# Patient Record
Sex: Female | Born: 1970 | Race: White | Hispanic: No | Smoking: Current every day smoker
Health system: Southern US, Community
[De-identification: ages and names within clinical notes are randomized; demographics above are authoritative.]

## PROBLEM LIST (undated history)

## (undated) DIAGNOSIS — A4902 Methicillin resistant Staphylococcus aureus infection, unspecified site: Secondary | ICD-10-CM

## (undated) DIAGNOSIS — F419 Anxiety disorder, unspecified: Secondary | ICD-10-CM

## (undated) DIAGNOSIS — F329 Major depressive disorder, single episode, unspecified: Secondary | ICD-10-CM

## (undated) DIAGNOSIS — G8929 Other chronic pain: Secondary | ICD-10-CM

## (undated) DIAGNOSIS — Z765 Malingerer [conscious simulation]: Secondary | ICD-10-CM

## (undated) DIAGNOSIS — K219 Gastro-esophageal reflux disease without esophagitis: Secondary | ICD-10-CM

## (undated) DIAGNOSIS — S069X9A Unspecified intracranial injury with loss of consciousness of unspecified duration, initial encounter: Secondary | ICD-10-CM

## (undated) DIAGNOSIS — F319 Bipolar disorder, unspecified: Secondary | ICD-10-CM

## (undated) DIAGNOSIS — M329 Systemic lupus erythematosus, unspecified: Secondary | ICD-10-CM

## (undated) DIAGNOSIS — M310 Hypersensitivity angiitis: Secondary | ICD-10-CM

## (undated) DIAGNOSIS — F32A Depression, unspecified: Secondary | ICD-10-CM

## (undated) DIAGNOSIS — R0600 Dyspnea, unspecified: Secondary | ICD-10-CM

## (undated) DIAGNOSIS — M19072 Primary osteoarthritis, left ankle and foot: Secondary | ICD-10-CM

## (undated) DIAGNOSIS — Z720 Tobacco use: Secondary | ICD-10-CM

## (undated) DIAGNOSIS — IMO0002 Reserved for concepts with insufficient information to code with codable children: Secondary | ICD-10-CM

## (undated) DIAGNOSIS — M1990 Unspecified osteoarthritis, unspecified site: Secondary | ICD-10-CM

## (undated) DIAGNOSIS — J449 Chronic obstructive pulmonary disease, unspecified: Secondary | ICD-10-CM

## (undated) HISTORY — PX: ANKLE SURGERY: SHX546

## (undated) HISTORY — DX: Unspecified osteoarthritis, unspecified site: M19.90

## (undated) HISTORY — PX: CHOLECYSTECTOMY: SHX55

## (undated) HISTORY — DX: Major depressive disorder, single episode, unspecified: F32.9

## (undated) HISTORY — DX: Depression, unspecified: F32.A

## (undated) HISTORY — PX: TUBAL LIGATION: SHX77

## (undated) HISTORY — DX: Other chronic pain: G89.29

## (undated) HISTORY — DX: Tobacco use: Z72.0

## (undated) HISTORY — DX: Hypersensitivity angiitis: M31.0

---

## 1998-03-25 ENCOUNTER — Ambulatory Visit (HOSPITAL_COMMUNITY): Admission: RE | Admit: 1998-03-25 | Discharge: 1998-03-26 | Payer: Self-pay | Admitting: Specialist

## 1998-12-10 ENCOUNTER — Encounter: Payer: Self-pay | Admitting: Emergency Medicine

## 1998-12-10 ENCOUNTER — Emergency Department (HOSPITAL_COMMUNITY): Admission: EM | Admit: 1998-12-10 | Discharge: 1998-12-10 | Payer: Self-pay | Admitting: Emergency Medicine

## 2000-01-01 ENCOUNTER — Inpatient Hospital Stay (HOSPITAL_COMMUNITY): Admission: AD | Admit: 2000-01-01 | Discharge: 2000-01-01 | Payer: Self-pay | Admitting: Obstetrics

## 2000-01-02 ENCOUNTER — Ambulatory Visit (HOSPITAL_COMMUNITY): Admission: RE | Admit: 2000-01-02 | Discharge: 2000-01-02 | Payer: Self-pay | Admitting: Obstetrics

## 2000-06-10 ENCOUNTER — Ambulatory Visit (HOSPITAL_COMMUNITY): Admission: RE | Admit: 2000-06-10 | Discharge: 2000-06-10 | Payer: Self-pay | Admitting: *Deleted

## 2000-06-21 ENCOUNTER — Inpatient Hospital Stay (HOSPITAL_COMMUNITY): Admission: AD | Admit: 2000-06-21 | Discharge: 2000-06-22 | Payer: Self-pay | Admitting: *Deleted

## 2000-10-09 ENCOUNTER — Emergency Department (HOSPITAL_COMMUNITY): Admission: EM | Admit: 2000-10-09 | Discharge: 2000-10-09 | Payer: Self-pay | Admitting: Emergency Medicine

## 2000-10-09 ENCOUNTER — Encounter: Payer: Self-pay | Admitting: Emergency Medicine

## 2000-10-12 ENCOUNTER — Emergency Department (HOSPITAL_COMMUNITY): Admission: EM | Admit: 2000-10-12 | Discharge: 2000-10-12 | Payer: Self-pay | Admitting: Emergency Medicine

## 2000-10-26 ENCOUNTER — Emergency Department (HOSPITAL_COMMUNITY): Admission: EM | Admit: 2000-10-26 | Discharge: 2000-10-26 | Payer: Self-pay | Admitting: Internal Medicine

## 2000-10-26 ENCOUNTER — Encounter: Payer: Self-pay | Admitting: Emergency Medicine

## 2000-12-29 ENCOUNTER — Encounter: Payer: Self-pay | Admitting: Emergency Medicine

## 2000-12-29 ENCOUNTER — Emergency Department (HOSPITAL_COMMUNITY): Admission: EM | Admit: 2000-12-29 | Discharge: 2000-12-29 | Payer: Self-pay | Admitting: Emergency Medicine

## 2001-01-02 ENCOUNTER — Encounter: Payer: Self-pay | Admitting: Emergency Medicine

## 2001-01-02 ENCOUNTER — Emergency Department (HOSPITAL_COMMUNITY): Admission: EM | Admit: 2001-01-02 | Discharge: 2001-01-02 | Payer: Self-pay | Admitting: Emergency Medicine

## 2001-01-06 ENCOUNTER — Ambulatory Visit (HOSPITAL_BASED_OUTPATIENT_CLINIC_OR_DEPARTMENT_OTHER): Admission: RE | Admit: 2001-01-06 | Discharge: 2001-01-06 | Payer: Self-pay | Admitting: Specialist

## 2001-01-14 ENCOUNTER — Emergency Department (HOSPITAL_COMMUNITY): Admission: EM | Admit: 2001-01-14 | Discharge: 2001-01-14 | Payer: Self-pay | Admitting: Emergency Medicine

## 2001-01-14 ENCOUNTER — Encounter: Payer: Self-pay | Admitting: Emergency Medicine

## 2001-05-14 ENCOUNTER — Emergency Department (HOSPITAL_COMMUNITY): Admission: EM | Admit: 2001-05-14 | Discharge: 2001-05-14 | Payer: Self-pay | Admitting: *Deleted

## 2001-05-23 ENCOUNTER — Encounter: Payer: Self-pay | Admitting: Emergency Medicine

## 2001-05-23 ENCOUNTER — Emergency Department (HOSPITAL_COMMUNITY): Admission: EM | Admit: 2001-05-23 | Discharge: 2001-05-23 | Payer: Self-pay | Admitting: Emergency Medicine

## 2001-06-09 ENCOUNTER — Emergency Department (HOSPITAL_COMMUNITY): Admission: EM | Admit: 2001-06-09 | Discharge: 2001-06-09 | Payer: Self-pay | Admitting: Emergency Medicine

## 2001-06-20 ENCOUNTER — Emergency Department (HOSPITAL_COMMUNITY): Admission: EM | Admit: 2001-06-20 | Discharge: 2001-06-20 | Payer: Self-pay | Admitting: Emergency Medicine

## 2001-07-06 ENCOUNTER — Encounter: Payer: Self-pay | Admitting: Emergency Medicine

## 2001-07-06 ENCOUNTER — Emergency Department (HOSPITAL_COMMUNITY): Admission: EM | Admit: 2001-07-06 | Discharge: 2001-07-06 | Payer: Self-pay | Admitting: Emergency Medicine

## 2001-07-14 ENCOUNTER — Emergency Department (HOSPITAL_COMMUNITY): Admission: EM | Admit: 2001-07-14 | Discharge: 2001-07-14 | Payer: Self-pay | Admitting: Emergency Medicine

## 2001-07-14 ENCOUNTER — Encounter: Payer: Self-pay | Admitting: Emergency Medicine

## 2001-08-02 ENCOUNTER — Emergency Department (HOSPITAL_COMMUNITY): Admission: EM | Admit: 2001-08-02 | Discharge: 2001-08-02 | Payer: Self-pay

## 2001-08-10 ENCOUNTER — Encounter: Payer: Self-pay | Admitting: Emergency Medicine

## 2001-08-10 ENCOUNTER — Emergency Department (HOSPITAL_COMMUNITY): Admission: EM | Admit: 2001-08-10 | Discharge: 2001-08-10 | Payer: Self-pay | Admitting: Emergency Medicine

## 2001-09-05 ENCOUNTER — Emergency Department (HOSPITAL_COMMUNITY): Admission: EM | Admit: 2001-09-05 | Discharge: 2001-09-05 | Payer: Self-pay | Admitting: Emergency Medicine

## 2001-09-05 ENCOUNTER — Encounter: Payer: Self-pay | Admitting: Emergency Medicine

## 2001-09-16 ENCOUNTER — Emergency Department (HOSPITAL_COMMUNITY): Admission: EM | Admit: 2001-09-16 | Discharge: 2001-09-16 | Payer: Self-pay | Admitting: Emergency Medicine

## 2001-10-31 ENCOUNTER — Inpatient Hospital Stay (HOSPITAL_COMMUNITY): Admission: RE | Admit: 2001-10-31 | Discharge: 2001-11-03 | Payer: Self-pay | Admitting: Specialist

## 2001-10-31 ENCOUNTER — Encounter: Payer: Self-pay | Admitting: Specialist

## 2001-12-22 ENCOUNTER — Encounter: Payer: Self-pay | Admitting: Specialist

## 2001-12-22 ENCOUNTER — Ambulatory Visit (HOSPITAL_COMMUNITY): Admission: RE | Admit: 2001-12-22 | Discharge: 2001-12-22 | Payer: Self-pay | Admitting: Specialist

## 2002-01-09 ENCOUNTER — Ambulatory Visit (HOSPITAL_COMMUNITY): Admission: RE | Admit: 2002-01-09 | Discharge: 2002-01-09 | Payer: Self-pay | Admitting: Specialist

## 2002-01-09 ENCOUNTER — Encounter: Payer: Self-pay | Admitting: Specialist

## 2002-02-01 ENCOUNTER — Emergency Department (HOSPITAL_COMMUNITY): Admission: EM | Admit: 2002-02-01 | Discharge: 2002-02-01 | Payer: Self-pay | Admitting: Emergency Medicine

## 2002-02-07 ENCOUNTER — Emergency Department (HOSPITAL_COMMUNITY): Admission: EM | Admit: 2002-02-07 | Discharge: 2002-02-07 | Payer: Self-pay

## 2002-02-27 ENCOUNTER — Emergency Department (HOSPITAL_COMMUNITY): Admission: EM | Admit: 2002-02-27 | Discharge: 2002-02-27 | Payer: Self-pay | Admitting: Emergency Medicine

## 2002-03-15 ENCOUNTER — Emergency Department (HOSPITAL_COMMUNITY): Admission: EM | Admit: 2002-03-15 | Discharge: 2002-03-16 | Payer: Self-pay | Admitting: Emergency Medicine

## 2002-03-15 ENCOUNTER — Encounter: Payer: Self-pay | Admitting: Emergency Medicine

## 2002-04-06 ENCOUNTER — Emergency Department (HOSPITAL_COMMUNITY): Admission: EM | Admit: 2002-04-06 | Discharge: 2002-04-06 | Payer: Self-pay | Admitting: Emergency Medicine

## 2002-04-06 ENCOUNTER — Encounter: Payer: Self-pay | Admitting: Emergency Medicine

## 2002-05-15 ENCOUNTER — Emergency Department (HOSPITAL_COMMUNITY): Admission: EM | Admit: 2002-05-15 | Discharge: 2002-05-15 | Payer: Self-pay | Admitting: Emergency Medicine

## 2002-06-26 ENCOUNTER — Emergency Department (HOSPITAL_COMMUNITY): Admission: EM | Admit: 2002-06-26 | Discharge: 2002-06-26 | Payer: Self-pay | Admitting: Emergency Medicine

## 2002-07-19 ENCOUNTER — Inpatient Hospital Stay (HOSPITAL_COMMUNITY): Admission: EM | Admit: 2002-07-19 | Discharge: 2002-07-22 | Payer: Self-pay | Admitting: *Deleted

## 2003-06-15 ENCOUNTER — Emergency Department (HOSPITAL_COMMUNITY): Admission: EM | Admit: 2003-06-15 | Discharge: 2003-06-15 | Payer: Self-pay | Admitting: Emergency Medicine

## 2003-06-15 ENCOUNTER — Encounter: Payer: Self-pay | Admitting: Emergency Medicine

## 2005-09-27 ENCOUNTER — Emergency Department (HOSPITAL_COMMUNITY): Admission: EM | Admit: 2005-09-27 | Discharge: 2005-09-28 | Payer: Self-pay | Admitting: Emergency Medicine

## 2006-06-06 ENCOUNTER — Ambulatory Visit (HOSPITAL_COMMUNITY): Admission: EM | Admit: 2006-06-06 | Discharge: 2006-06-07 | Payer: Self-pay | Admitting: Emergency Medicine

## 2006-06-06 ENCOUNTER — Encounter (INDEPENDENT_AMBULATORY_CARE_PROVIDER_SITE_OTHER): Payer: Self-pay | Admitting: Specialist

## 2006-06-06 ENCOUNTER — Encounter: Payer: Self-pay | Admitting: Family Medicine

## 2007-05-29 ENCOUNTER — Emergency Department (HOSPITAL_COMMUNITY): Admission: EM | Admit: 2007-05-29 | Discharge: 2007-05-29 | Payer: Self-pay | Admitting: Emergency Medicine

## 2007-07-28 ENCOUNTER — Emergency Department (HOSPITAL_COMMUNITY): Admission: EM | Admit: 2007-07-28 | Discharge: 2007-07-28 | Payer: Self-pay | Admitting: Emergency Medicine

## 2007-08-01 ENCOUNTER — Observation Stay (HOSPITAL_COMMUNITY): Admission: EM | Admit: 2007-08-01 | Discharge: 2007-08-01 | Payer: Self-pay | Admitting: Emergency Medicine

## 2007-09-15 ENCOUNTER — Emergency Department (HOSPITAL_COMMUNITY): Admission: EM | Admit: 2007-09-15 | Discharge: 2007-09-15 | Payer: Self-pay | Admitting: Emergency Medicine

## 2007-10-25 ENCOUNTER — Emergency Department (HOSPITAL_COMMUNITY): Admission: EM | Admit: 2007-10-25 | Discharge: 2007-10-26 | Payer: Self-pay | Admitting: Emergency Medicine

## 2007-11-26 ENCOUNTER — Emergency Department (HOSPITAL_COMMUNITY): Admission: EM | Admit: 2007-11-26 | Discharge: 2007-11-26 | Payer: Self-pay | Admitting: Emergency Medicine

## 2009-06-06 ENCOUNTER — Inpatient Hospital Stay (HOSPITAL_COMMUNITY): Admission: EM | Admit: 2009-06-06 | Discharge: 2009-06-09 | Payer: Self-pay | Admitting: Emergency Medicine

## 2009-06-07 ENCOUNTER — Encounter (INDEPENDENT_AMBULATORY_CARE_PROVIDER_SITE_OTHER): Payer: Self-pay | Admitting: Dermatology

## 2010-11-27 ENCOUNTER — Inpatient Hospital Stay (HOSPITAL_COMMUNITY)
Admission: EM | Admit: 2010-11-27 | Discharge: 2010-12-01 | Payer: Self-pay | Source: Home / Self Care | Attending: Internal Medicine | Admitting: Internal Medicine

## 2010-12-04 LAB — RPR: RPR Ser Ql: NONREACTIVE

## 2010-12-04 LAB — RAPID URINE DRUG SCREEN, HOSP PERFORMED
Amphetamines: NOT DETECTED
Barbiturates: NOT DETECTED
Benzodiazepines: POSITIVE — AB
Cocaine: POSITIVE — AB
Opiates: POSITIVE — AB
Tetrahydrocannabinol: POSITIVE — AB

## 2010-12-04 LAB — HEPATIC FUNCTION PANEL
ALT: 12 U/L (ref 0–35)
AST: 17 U/L (ref 0–37)
Albumin: 2.7 g/dL — ABNORMAL LOW (ref 3.5–5.2)
Alkaline Phosphatase: 63 U/L (ref 39–117)
Bilirubin, Direct: <0.1 mg/dL (ref 0.0–0.3)
Total Bilirubin: 0.2 mg/dL — ABNORMAL LOW (ref 0.3–1.2)
Total Protein: 6.3 g/dL (ref 6.0–8.3)

## 2010-12-04 LAB — BASIC METABOLIC PANEL
BUN: 5 mg/dL — ABNORMAL LOW (ref 6–23)
CO2: 26 mEq/L (ref 19–32)
Calcium: 8.4 mg/dL (ref 8.4–10.5)
Chloride: 107 mEq/L (ref 96–112)
Creatinine, Ser: 0.76 mg/dL (ref 0.4–1.2)
GFR calc Af Amer: >60 mL/min (ref >60)
GFR calc non Af Amer: >60 mL/min (ref >60)
Glucose, Bld: 176 mg/dL — ABNORMAL HIGH (ref 70–99)
Potassium: 4.3 mEq/L (ref 3.5–5.1)
Sodium: 139 mEq/L (ref 135–145)

## 2010-12-04 LAB — URINALYSIS, ROUTINE W REFLEX MICROSCOPIC
Bilirubin Urine: NEGATIVE
Hgb urine dipstick: NEGATIVE
Ketones, ur: NEGATIVE mg/dL
Nitrite: NEGATIVE
Protein, ur: NEGATIVE mg/dL
Specific Gravity, Urine: 1.009 (ref 1.005–1.030)
Urine Glucose, Fasting: NEGATIVE mg/dL
Urobilinogen, UA: 1 mg/dL (ref 0.0–1.0)
pH: 7.5 (ref 5.0–8.0)

## 2010-12-04 LAB — POCT I-STAT, CHEM 8
BUN: 4 mg/dL — ABNORMAL LOW (ref 6–23)
Calcium, Ion: 1.06 mmol/L — ABNORMAL LOW (ref 1.12–1.32)
Chloride: 109 mEq/L (ref 96–112)
Creatinine, Ser: 0.8 mg/dL (ref 0.4–1.2)
Glucose, Bld: 105 mg/dL — ABNORMAL HIGH (ref 70–99)
HCT: 34 % — ABNORMAL LOW (ref 36.0–46.0)
Hemoglobin: 11.6 g/dL — ABNORMAL LOW (ref 12.0–15.0)
Potassium: 3.6 mEq/L (ref 3.5–5.1)
Sodium: 142 mEq/L (ref 135–145)
TCO2: 25 mmol/L (ref 0–100)

## 2010-12-04 LAB — LIPID PANEL
Cholesterol: 137 mg/dL (ref 0–200)
HDL: 44 mg/dL (ref >39)
LDL Cholesterol: 78 mg/dL (ref 0–99)
Total CHOL/HDL Ratio: 3.1 RATIO
Triglycerides: 73 mg/dL (ref <150)
VLDL: 15 mg/dL (ref 0–40)

## 2010-12-04 LAB — CBC
HCT: 35.2 % — ABNORMAL LOW (ref 36.0–46.0)
HCT: 37.4 % (ref 36.0–46.0)
Hemoglobin: 11.5 g/dL — ABNORMAL LOW (ref 12.0–15.0)
Hemoglobin: 12.4 g/dL (ref 12.0–15.0)
MCH: 28.3 pg (ref 26.0–34.0)
MCHC: 32.7 g/dL (ref 30.0–36.0)
MCHC: 33.2 g/dL (ref 30.0–36.0)
MCV: 86.5 fL (ref 78.0–100.0)
Platelets: 199 10*3/uL (ref 150–400)
Platelets: 203 10*3/uL (ref 150–400)
RBC: 4.07 MIL/uL (ref 3.87–5.11)
RBC: 4.3 MIL/uL (ref 3.87–5.11)
RDW: 12.8 % (ref 11.5–15.5)
RDW: 12.9 % (ref 11.5–15.5)
WBC: 4 10*3/uL (ref 4.0–10.5)
WBC: 4.3 10*3/uL (ref 4.0–10.5)

## 2010-12-04 LAB — HEPATITIS PANEL, ACUTE
HCV Ab: NEGATIVE
Hep A IgM: NEGATIVE
Hep B C IgM: NEGATIVE
Hepatitis B Surface Ag: NEGATIVE

## 2010-12-04 LAB — C3 COMPLEMENT: C3 Complement: 77 mg/dL — ABNORMAL LOW (ref 88–201)

## 2010-12-04 LAB — DIFFERENTIAL
Basophils Absolute: 0 10*3/uL (ref 0.0–0.1)
Basophils Relative: 1 % (ref 0–1)
Eosinophils Absolute: 0.1 10*3/uL (ref 0.0–0.7)
Eosinophils Relative: 3 % (ref 0–5)
Lymphocytes Relative: 40 % (ref 12–46)
Lymphs Abs: 1.6 10*3/uL (ref 0.7–4.0)
Monocytes Absolute: 0.3 10*3/uL (ref 0.1–1.0)
Monocytes Relative: 7 % (ref 3–12)
Neutro Abs: 2 10*3/uL (ref 1.7–7.7)
Neutrophils Relative %: 50 % (ref 43–77)

## 2010-12-04 LAB — SEDIMENTATION RATE: Sed Rate: 32 mm/hr — ABNORMAL HIGH (ref 0–22)

## 2010-12-04 LAB — PROTIME-INR
INR: 1.01 (ref 0.00–1.49)
Prothrombin Time: 13.5 seconds (ref 11.6–15.2)

## 2010-12-04 LAB — MRSA PCR SCREENING: MRSA by PCR: POSITIVE — AB

## 2010-12-04 LAB — HIV ANTIBODY (ROUTINE TESTING W REFLEX): HIV: NONREACTIVE

## 2010-12-04 LAB — C4 COMPLEMENT: Complement C4, Body Fluid: 10 mg/dL — ABNORMAL LOW (ref 16–47)

## 2011-01-07 ENCOUNTER — Inpatient Hospital Stay (HOSPITAL_COMMUNITY)
Admission: EM | Admit: 2011-01-07 | Discharge: 2011-01-11 | DRG: 546 | Disposition: A | Discharge status: Home / Self Care | Payer: Self-pay | Attending: Internal Medicine | Admitting: Internal Medicine

## 2011-01-07 DIAGNOSIS — F3289 Other specified depressive episodes: Secondary | ICD-10-CM | POA: Diagnosis present

## 2011-01-07 DIAGNOSIS — F17200 Nicotine dependence, unspecified, uncomplicated: Secondary | ICD-10-CM | POA: Diagnosis present

## 2011-01-07 DIAGNOSIS — IMO0002 Reserved for concepts with insufficient information to code with codable children: Secondary | ICD-10-CM

## 2011-01-07 DIAGNOSIS — L02818 Cutaneous abscess of other sites: Secondary | ICD-10-CM | POA: Diagnosis present

## 2011-01-07 DIAGNOSIS — M310 Hypersensitivity angiitis: Principal | ICD-10-CM | POA: Diagnosis present

## 2011-01-07 DIAGNOSIS — F1910 Other psychoactive substance abuse, uncomplicated: Secondary | ICD-10-CM | POA: Diagnosis present

## 2011-01-07 DIAGNOSIS — Z5900 Homelessness unspecified: Secondary | ICD-10-CM

## 2011-01-07 DIAGNOSIS — N390 Urinary tract infection, site not specified: Secondary | ICD-10-CM | POA: Diagnosis present

## 2011-01-07 DIAGNOSIS — F329 Major depressive disorder, single episode, unspecified: Secondary | ICD-10-CM | POA: Diagnosis present

## 2011-01-07 DIAGNOSIS — E876 Hypokalemia: Secondary | ICD-10-CM | POA: Diagnosis present

## 2011-01-07 DIAGNOSIS — F1410 Cocaine abuse, uncomplicated: Secondary | ICD-10-CM | POA: Diagnosis present

## 2011-01-07 DIAGNOSIS — F411 Generalized anxiety disorder: Secondary | ICD-10-CM | POA: Diagnosis present

## 2011-01-07 LAB — DIFFERENTIAL
Basophils Absolute: 0 10*3/uL (ref 0.0–0.1)
Basophils Relative: 0 % (ref 0–1)
Eosinophils Absolute: 0 10*3/uL (ref 0.0–0.7)
Monocytes Relative: 3 % (ref 3–12)
Neutro Abs: 4.7 10*3/uL (ref 1.7–7.7)
Neutrophils Relative %: 81 % — ABNORMAL HIGH (ref 43–77)

## 2011-01-07 LAB — URINALYSIS, ROUTINE W REFLEX MICROSCOPIC
Hgb urine dipstick: NEGATIVE
Ketones, ur: 40 mg/dL — AB
Specific Gravity, Urine: 1.04 — ABNORMAL HIGH (ref 1.005–1.030)
pH: 6 (ref 5.0–8.0)

## 2011-01-07 LAB — RAPID URINE DRUG SCREEN, HOSP PERFORMED: Benzodiazepines: NOT DETECTED

## 2011-01-07 LAB — CBC
MCH: 28.2 pg (ref 26.0–34.0)
MCHC: 35.5 g/dL (ref 30.0–36.0)
Platelets: 243 10*3/uL (ref 150–400)
RBC: 5.22 MIL/uL — ABNORMAL HIGH (ref 3.87–5.11)

## 2011-01-07 LAB — URINE MICROSCOPIC-ADD ON

## 2011-01-07 LAB — POCT I-STAT, CHEM 8
Calcium, Ion: 1.06 mmol/L — ABNORMAL LOW (ref 1.12–1.32)
HCT: 44 % (ref 36.0–46.0)
Hemoglobin: 15 g/dL (ref 12.0–15.0)
TCO2: 20 mmol/L (ref 0–100)

## 2011-01-07 LAB — POCT CARDIAC MARKERS
Myoglobin, poc: 122 ng/mL (ref 12–200)
Troponin i, poc: <0.05 ng/mL (ref 0.00–0.09)

## 2011-01-07 LAB — ETHANOL: Alcohol, Ethyl (B): <5 mg/dL (ref 0–10)

## 2011-01-08 LAB — CBC
HCT: 37.9 % (ref 36.0–46.0)
Hemoglobin: 13 g/dL (ref 12.0–15.0)
MCV: 80.6 fL (ref 78.0–100.0)
Platelets: 235 10*3/uL (ref 150–400)
RBC: 4.7 MIL/uL (ref 3.87–5.11)
WBC: 3.1 10*3/uL — ABNORMAL LOW (ref 4.0–10.5)

## 2011-01-08 LAB — MRSA PCR SCREENING: MRSA by PCR: POSITIVE — AB

## 2011-01-08 LAB — BASIC METABOLIC PANEL
BUN: 5 mg/dL — ABNORMAL LOW (ref 6–23)
CO2: 24 mEq/L (ref 19–32)
Chloride: 106 mEq/L (ref 96–112)
Potassium: 3.5 mEq/L (ref 3.5–5.1)

## 2011-01-09 LAB — BASIC METABOLIC PANEL
BUN: 9 mg/dL (ref 6–23)
Calcium: 8.8 mg/dL (ref 8.4–10.5)
GFR calc non Af Amer: >60 mL/min (ref >60)
Glucose, Bld: 150 mg/dL — ABNORMAL HIGH (ref 70–99)
Sodium: 138 mEq/L (ref 135–145)

## 2011-01-09 LAB — CBC
HCT: 36.8 % (ref 36.0–46.0)
MCHC: 33.7 g/dL (ref 30.0–36.0)
MCV: 84.4 fL (ref 78.0–100.0)
RDW: 14.1 % (ref 11.5–15.5)

## 2011-01-11 LAB — WOUND CULTURE

## 2011-01-14 LAB — CULTURE, BLOOD (ROUTINE X 2)
Culture  Setup Time: 201202200857
Culture: NO GROWTH

## 2011-01-25 NOTE — H&P (Signed)
[name], [name]                  ACCOUNT NO.:  [id number]  MEDICAL RECORD NO.:  [id number]           PATIENT TYPE:  E  LOCATION:  [facility]                         FACILITY:  [facility]  PHYSICIAN:  Lonia Blood, M.D.      DATE OF BIRTH:  [birth year]  DATE OF ADMISSION:  01/07/2011 DATE OF DISCHARGE:                             HISTORY & PHYSICAL   PRIMARY CARE PHYSICIAN:  She is unassigned to [place].  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  She is being followed by Dr. Kellie Simmering, rheumatologist.  She also mentioned that she has had history of lupus in the past.  The patient abuses cocaine and she was seen here back on November 27, 2010, through December 01, 2010.  At that time, she was treated with steroids for a flare of her leukocytoclastic vasculitis. She was asked to follow up with [facility], but she never did.  She is back today with the same lesions.  She claimed that she has been on 40 mg of prednisone.  However, based on the prescription, she should have been off it by now through the tapering system, which means she has not been using it.  She is also having pain in her buttock areas with some obvious lesions.  The patient is also complaining of some mild pain all over.  She has had all the necessary workup recently.  Please refer to admission just a month ago.  PAST MEDICAL HISTORY: 1. Leukocytoclastic vasculitis. 2. Depression. 3. Anxiety. 4. Cocaine abuse. 5. Polysubstance abuse.  She is status post ankle surgery from crush injury, status post cholecystectomy.  ALLERGIES:  She has no known drug allergies.  CURRENT MEDICATIONS:  Prednisone only, and I doubt if she is taking it.  SOCIAL HISTORY:  She is 40.  She still smokes tobacco and uses cocaine. The patient has 1 daughter who  is 60 years' old.  She also has 1 sister. She is currently homeless and disabled.  She still admits to using crack cocaine, but wants to quit.  FAMILY HISTORY:  Noncontributory, with no known history of autoimmune diseases.  REVIEW OF SYSTEMS:  Generalized weakness and tiredness.  Otherwise, all systems reviewed are currently negative except per HPI.  VITAL SIGNS:  On exam, temperature is 97.6, blood pressure 127/94, pulse of 100, respiratory rate 18, sats 100% on room air. GENERAL:  The patient is acutely ill looking, worried, but in no acute distress. HEENT:  PERRL.  EOMI.  No pallor, no jaundice.  No rhinorrhea.  She has poor dentition. NECK:  Supple.  No JVD, no lymphadenopathy. RESPIRATORY:  She has good air entry bilaterally.  No wheeze or rales. CARDIOVASCULAR:  She is mildly tachycardic. ABDOMEN:  Soft, full, nontender, with positive bowel sounds. EXTREMITIES:  Show no edema, cyanosis, clubbing. SKIN:  Exam shows multiple vasculitic changes on the trunks.  The patient has also open sores on her buttocks.  Exam looks entirely like  previous presentation about a month ago.  LABORATORY DATA:  Her urine drug screen is positive for cocaine mainly. Alcohol level is less than 5.  Her sodium is 137, potassium is 3.0, chloride is 105, glucose 121, BUN 7, creatinine 0.6.  Her urinalysis is positive for bilirubin, ketones, proteins, also cloudy with some leukocyte esterase.  Urine microscopy showed calcium oxalate crystals, wbc's 3 to 6, and many bacteria.  ASSESSMENT:  This is a 40 year old female with known history of leukocytoclastic vasculitis, presenting with another flare, more than likely secondary to medication noncompliance.  There is also evidence of some urinary tract infection, hypokalemia, and some carbuncles and furuncles.  The patient still uses cocaine, which may be the trigger, especially the levamisole in the cocaine that is known to cause some vasculitis.  She has  continued to use it despite counseling.  PLAN: 1. Leukocytoclastic vasculitis.  Mainly, treatment will be supportive     in nature.  I will use some IV steroids in the beginning and     transition her to oral steroids as necessary.  In the meantime,     however, the patient has been counseled that the only solution is     for her to quit taking cocaine and also to follow up as an     outpatient for close monitoring. 2. Urinary tract infection.  I will start her on empiric antibiotics     and get some urine culture. 3. Cocaine abuse.  Again, she has been counseled and we will do     substance abuse counseling once again.  She may need to go through     some halfway house or some inpatient rehab somewhere, but it is     difficult without an insurance. 4. Hypokalemia.  We will replete her potassium for now.     Lonia Blood, M.D.     [name]  D:  01/07/2011  T:  01/08/2011  Job:  [id number]  Electronically Signed by Lonia Blood M.D. on 01/25/2011 06:30:00 AM

## 2011-02-07 NOTE — Discharge Summary (Signed)
[name], [name]                  ACCOUNT NO.:  [id number]  MEDICAL RECORD NO.:  [id number]           PATIENT TYPE:  I  LOCATION:  5025                         FACILITY:  [facility]  PHYSICIAN:  Clydia Llano, MD       DATE OF BIRTH:  September 13, 1971  DATE OF ADMISSION:  01/07/2011 DATE OF DISCHARGE:  01/11/2011                              DISCHARGE SUMMARY   PRIMARY CARE PHYSICIAN:  [facility].  REASON FOR ADMISSION:  Skin rash.  DISCHARGE DIAGNOSES: 1. Cellulitis. 2. Leukocytoclastic vasculitis flare. 3. Depression. 4. Polysubstance abuse.  DISCHARGE MEDICATIONS: 1. Doxycycline 100 mg b.i.d. for 10 more days. 2. Prednisone 10 mg, take 4 tablets for 2 days, then 3 tablets for 2     days, then 2 tablets for 2 days, then 1 tablet for 2 days, then     stop. 3. Silver sulfadiazine 1% cream, apply topically as needed. 4. Tramadol 50 mg half to one tablet p.o. q.8 h. as needed for pain. 5. Tylenol 325 two tablets every 4 hours as needed for pain.  BRIEF HISTORY AND EXAMINATION:  [name] is a 40 year old female with known history of leukocytoclastic vasculitis from previous biopsy that was done in July 2010.  The patient was being followed by Dr. Kellie Simmering from Rheumatology and Dr. Mathews Robinsons from Dermatology.  The patient came into the hospital because of rash and lesions in her buttocks as well as extensor lesions of the upper and lower extremities, mainly around the elbow joint and the knee joints.  Some of the rash is open and has clear to serosanguineous discharge.  The patient was in the hospital from January 9th to 13th for the same problem, and patient was discharged on steroids for the flare-up and asked to follow up with [facility], which she apparently never did.  BRIEF HOSPITAL COURSE: 1. Leukocytoclastic vasculitis with a flare-up.  Upon admission,     patient was started on IV steroids.  The patient was mentioning     pain allover the skin lesions.  Mainly, the  treatment for the     leukocytoclastic vasculitis is supportive with steroids and pain     medication as well as skin lubricants as well as skin hydrants.     The patient was started on all of these.  There was question about     superimposed infection.  The patient was on vancomycin during the     hospital stay, she had that for 3 days.  She will be discharged, to     take doxycycline for 10 more days. 2. Polysubstance abuse.  The patient has urine toxicology positive for     cocaine.  Although patient is denying frequent use, but whenever     she came to the hospital, apparently it was positive.  The patient     was counseled about cocaine use and about the relationship between     the leukocytoclastic vasculitis and cocaine.  Especially, the     filling materials in cocaine, namely levamisole which can flare up     the vasculitis.  The patient voiced understanding.  DISCHARGE INSTRUCTIONS: 1. Activity as tolerated. 2. Diet:  Regular.  Disposition:  Home. 3. Followup.  Follow up with [facility]. 4. Special instructions.  Wound care, apply Silvadene topically as     needed.  The patient, also if needed, was seen before by Dr. Mathews Robinsons from Dermatology, if she needs followup.     Clydia Llano, MD     ME/MEDQ  D:  01/11/2011  T:  01/12/2011  Job:  [id number]  Electronically Signed by Clydia Llano  on 02/07/2011 07:47:28 PM

## 2011-02-25 LAB — T-HELPER CELLS (CD4) COUNT (NOT AT ARMC)
CD4 % Helper T Cell: 53 % (ref 33–55)
CD4 T Cell Abs: 730 uL (ref 400–2700)

## 2011-02-25 LAB — CBC
HCT: 35.3 % — ABNORMAL LOW (ref 36.0–46.0)
Hemoglobin: 12.1 g/dL (ref 12.0–15.0)
MCHC: 34.4 g/dL (ref 30.0–36.0)
MCHC: 34.7 g/dL (ref 30.0–36.0)
Platelets: 271 10*3/uL (ref 150–400)
RDW: 13.5 % (ref 11.5–15.5)
RDW: 14.1 % (ref 11.5–15.5)

## 2011-02-25 LAB — LYSOZYME, SERUM: Lysozyme: 11 ug/mL (ref 9–17)

## 2011-02-25 LAB — DIFFERENTIAL
Basophils Absolute: 0 10*3/uL (ref 0.0–0.1)
Lymphocytes Relative: 24 % (ref 12–46)
Lymphs Abs: 0.9 10*3/uL (ref 0.7–4.0)
Monocytes Absolute: 0.1 10*3/uL (ref 0.1–1.0)
Neutro Abs: 2.6 10*3/uL (ref 1.7–7.7)

## 2011-02-25 LAB — CULTURE, BLOOD (ROUTINE X 2): Culture: NO GROWTH

## 2011-02-25 LAB — COMPREHENSIVE METABOLIC PANEL
ALT: 35 U/L (ref 0–35)
AST: 30 U/L (ref 0–37)
Calcium: 8.5 mg/dL (ref 8.4–10.5)
GFR calc Af Amer: >60 mL/min (ref >60)
Sodium: 128 mEq/L — ABNORMAL LOW (ref 135–145)
Total Protein: 6.9 g/dL (ref 6.0–8.3)

## 2011-02-25 LAB — RAPID URINE DRUG SCREEN, HOSP PERFORMED
Barbiturates: NOT DETECTED
Benzodiazepines: POSITIVE — AB
Opiates: POSITIVE — AB

## 2011-02-25 LAB — BASIC METABOLIC PANEL
BUN: 8 mg/dL (ref 6–23)
CO2: 22 mEq/L (ref 19–32)
Glucose, Bld: 107 mg/dL — ABNORMAL HIGH (ref 70–99)
Potassium: 4.6 mEq/L (ref 3.5–5.1)
Sodium: 134 mEq/L — ABNORMAL LOW (ref 135–145)

## 2011-02-25 LAB — CHOLESTEROL, TOTAL: Cholesterol: 133 mg/dL (ref 0–200)

## 2011-02-25 LAB — ANA: Anti Nuclear Antibody(ANA): POSITIVE — AB

## 2011-02-25 LAB — ANTI-NUCLEAR AB-TITER (ANA TITER): ANA Titer 1: NEGATIVE

## 2011-02-25 LAB — GLUCOSE, CAPILLARY
Glucose-Capillary: 106 mg/dL — ABNORMAL HIGH (ref 70–99)
Glucose-Capillary: 83 mg/dL (ref 70–99)

## 2011-02-25 LAB — TSH: TSH: 4.71 u[IU]/mL — ABNORMAL HIGH (ref 0.350–4.500)

## 2011-02-25 LAB — HEMOGLOBIN A1C: Hgb A1c MFr Bld: 4.2 % — ABNORMAL LOW (ref 4.6–6.1)

## 2011-02-25 LAB — C3 COMPLEMENT: C3 Complement: 105 mg/dL (ref 88–201)

## 2011-03-10 ENCOUNTER — Emergency Department (HOSPITAL_COMMUNITY)
Admission: EM | Admit: 2011-03-10 | Discharge: 2011-03-10 | Disposition: A | Discharge status: Home / Self Care | Payer: Self-pay | Attending: Emergency Medicine | Admitting: Emergency Medicine

## 2011-03-10 DIAGNOSIS — R259 Unspecified abnormal involuntary movements: Secondary | ICD-10-CM | POA: Insufficient documentation

## 2011-03-10 DIAGNOSIS — R21 Rash and other nonspecific skin eruption: Secondary | ICD-10-CM | POA: Insufficient documentation

## 2011-03-10 DIAGNOSIS — R52 Pain, unspecified: Secondary | ICD-10-CM | POA: Insufficient documentation

## 2011-03-10 DIAGNOSIS — F1410 Cocaine abuse, uncomplicated: Secondary | ICD-10-CM | POA: Insufficient documentation

## 2011-03-10 LAB — BASIC METABOLIC PANEL
BUN: 10 mg/dL (ref 6–23)
Chloride: 104 mEq/L (ref 96–112)
Glucose, Bld: 133 mg/dL — ABNORMAL HIGH (ref 70–99)
Potassium: 3.4 mEq/L — ABNORMAL LOW (ref 3.5–5.1)

## 2011-03-10 LAB — DIFFERENTIAL
Eosinophils Relative: 1 % (ref 0–5)
Lymphocytes Relative: 18 % (ref 12–46)
Lymphs Abs: 1.2 10*3/uL (ref 0.7–4.0)

## 2011-03-10 LAB — CBC
HCT: 39.4 % (ref 36.0–46.0)
MCV: 81.7 fL (ref 78.0–100.0)
RBC: 4.82 MIL/uL (ref 3.87–5.11)
RDW: 12.9 % (ref 11.5–15.5)
WBC: 6.8 10*3/uL (ref 4.0–10.5)

## 2011-03-12 ENCOUNTER — Emergency Department (HOSPITAL_COMMUNITY)
Admission: EM | Admit: 2011-03-12 | Discharge: 2011-03-12 | Disposition: A | Discharge status: Home / Self Care | Payer: Self-pay | Attending: Emergency Medicine | Admitting: Emergency Medicine

## 2011-03-12 ENCOUNTER — Inpatient Hospital Stay (HOSPITAL_COMMUNITY)
Admission: EM | Admit: 2011-03-12 | Discharge: 2011-03-16 | DRG: 917 | Disposition: A | Discharge status: Hospice | Payer: Self-pay | Attending: Internal Medicine | Admitting: Internal Medicine

## 2011-03-12 ENCOUNTER — Emergency Department (HOSPITAL_COMMUNITY): Payer: Self-pay

## 2011-03-12 DIAGNOSIS — G9340 Encephalopathy, unspecified: Secondary | ICD-10-CM | POA: Diagnosis present

## 2011-03-12 DIAGNOSIS — Z5900 Homelessness unspecified: Secondary | ICD-10-CM

## 2011-03-12 DIAGNOSIS — M310 Hypersensitivity angiitis: Secondary | ICD-10-CM | POA: Insufficient documentation

## 2011-03-12 DIAGNOSIS — F1410 Cocaine abuse, uncomplicated: Secondary | ICD-10-CM | POA: Diagnosis present

## 2011-03-12 DIAGNOSIS — M329 Systemic lupus erythematosus, unspecified: Secondary | ICD-10-CM | POA: Insufficient documentation

## 2011-03-12 DIAGNOSIS — G8929 Other chronic pain: Secondary | ICD-10-CM | POA: Diagnosis present

## 2011-03-12 DIAGNOSIS — R21 Rash and other nonspecific skin eruption: Secondary | ICD-10-CM | POA: Diagnosis present

## 2011-03-12 DIAGNOSIS — T400X1A Poisoning by opium, accidental (unintentional), initial encounter: Secondary | ICD-10-CM | POA: Diagnosis present

## 2011-03-12 DIAGNOSIS — F341 Dysthymic disorder: Secondary | ICD-10-CM | POA: Diagnosis present

## 2011-03-12 DIAGNOSIS — E876 Hypokalemia: Secondary | ICD-10-CM | POA: Diagnosis present

## 2011-03-12 DIAGNOSIS — I498 Other specified cardiac arrhythmias: Secondary | ICD-10-CM | POA: Diagnosis present

## 2011-03-12 DIAGNOSIS — T40601A Poisoning by unspecified narcotics, accidental (unintentional), initial encounter: Principal | ICD-10-CM | POA: Diagnosis present

## 2011-03-12 DIAGNOSIS — F17200 Nicotine dependence, unspecified, uncomplicated: Secondary | ICD-10-CM | POA: Diagnosis present

## 2011-03-12 LAB — POCT I-STAT 3, ART BLOOD GAS (G3+)
Patient temperature: 95.9
pCO2 arterial: 42.9 mmHg (ref 35.0–45.0)
pH, Arterial: 7.371 (ref 7.350–7.400)

## 2011-03-12 LAB — URINALYSIS, ROUTINE W REFLEX MICROSCOPIC
Bilirubin Urine: NEGATIVE
Glucose, UA: NEGATIVE mg/dL
Protein, ur: NEGATIVE mg/dL

## 2011-03-12 LAB — CBC
HCT: 37.6 % (ref 36.0–46.0)
MCHC: 32.7 g/dL (ref 30.0–36.0)
MCV: 83.6 fL (ref 78.0–100.0)
RDW: 13.1 % (ref 11.5–15.5)

## 2011-03-12 LAB — COMPREHENSIVE METABOLIC PANEL
ALT: 12 U/L (ref 0–35)
AST: 13 U/L (ref 0–37)
Alkaline Phosphatase: 67 U/L (ref 39–117)
CO2: 21 mEq/L (ref 19–32)
GFR calc non Af Amer: >60 mL/min (ref >60)
Glucose, Bld: 120 mg/dL — ABNORMAL HIGH (ref 70–99)
Potassium: 3.8 mEq/L (ref 3.5–5.1)
Sodium: 138 mEq/L (ref 135–145)
Total Protein: 7.6 g/dL (ref 6.0–8.3)

## 2011-03-12 LAB — DIFFERENTIAL
Eosinophils Relative: 0 % (ref 0–5)
Lymphocytes Relative: 15 % (ref 12–46)
Lymphs Abs: 1 10*3/uL (ref 0.7–4.0)
Monocytes Absolute: 0.2 10*3/uL (ref 0.1–1.0)
Monocytes Relative: 3 % (ref 3–12)

## 2011-03-12 LAB — GLUCOSE, CAPILLARY

## 2011-03-12 LAB — POCT I-STAT, CHEM 8
BUN: 9 mg/dL (ref 6–23)
Calcium, Ion: 1.05 mmol/L — ABNORMAL LOW (ref 1.12–1.32)
Chloride: 104 mEq/L (ref 96–112)
HCT: 41 % (ref 36.0–46.0)
Potassium: 3.9 mEq/L (ref 3.5–5.1)
Sodium: 134 mEq/L — ABNORMAL LOW (ref 135–145)

## 2011-03-12 LAB — ACETAMINOPHEN LEVEL: Acetaminophen (Tylenol), Serum: <10 ug/mL — ABNORMAL LOW (ref 10–30)

## 2011-03-12 LAB — POCT PREGNANCY, URINE: Preg Test, Ur: NEGATIVE

## 2011-03-12 LAB — ETHANOL: Alcohol, Ethyl (B): <5 mg/dL (ref 0–10)

## 2011-03-12 LAB — SALICYLATE LEVEL: Salicylate Lvl: <4 mg/dL (ref 2.8–20.0)

## 2011-03-13 DIAGNOSIS — F4323 Adjustment disorder with mixed anxiety and depressed mood: Secondary | ICD-10-CM

## 2011-03-13 LAB — COMPREHENSIVE METABOLIC PANEL
ALT: 35 U/L (ref 0–35)
AST: 46 U/L — ABNORMAL HIGH (ref 0–37)
Albumin: 3.3 g/dL — ABNORMAL LOW (ref 3.5–5.2)
Alkaline Phosphatase: 78 U/L (ref 39–117)
CO2: 26 mEq/L (ref 19–32)
Chloride: 104 mEq/L (ref 96–112)
GFR calc Af Amer: >60 mL/min (ref >60)
Potassium: 3.1 mEq/L — ABNORMAL LOW (ref 3.5–5.1)
Sodium: 137 mEq/L (ref 135–145)
Total Bilirubin: 0.3 mg/dL (ref 0.3–1.2)

## 2011-03-13 LAB — RAPID URINE DRUG SCREEN, HOSP PERFORMED
Amphetamines: NOT DETECTED
Benzodiazepines: POSITIVE — AB

## 2011-03-13 LAB — CBC
Hemoglobin: 12.5 g/dL (ref 12.0–15.0)
Platelets: 279 10*3/uL (ref 150–400)
RBC: 4.48 MIL/uL (ref 3.87–5.11)
WBC: 7.1 10*3/uL (ref 4.0–10.5)

## 2011-03-14 NOTE — Consult Note (Signed)
[name], [name]                  ACCOUNT NO.:  [id number]  MEDICAL RECORD NO.:  [id number]           PATIENT TYPE:  I  LOCATION:  2024                         FACILITY:  [facility]  PHYSICIAN:  Eulogio Ditch, MD DATE OF BIRTH:  [birth year]  DATE OF CONSULTATION:  03/13/2011 DATE OF DISCHARGE:                                CONSULTATION   REASON FOR CONSULTATION:  Depression.  HISTORY OF PRESENT ILLNESS:  A 40 year old white female with history of cocaine abuse who was admitted as she was found with altered mental status and unresponsive at home.  The patient reported that she was given a fentanyl patch by a friend, but she did not try to overdose and kill herself.  The patient was crying during the interview, reported depressed mood, sleep poor, but appetite okay.  Reported hopeless, helpless because of the drug abuse and homelessness.  The patient is logical and goal directed during interview.  Denies any suicidal or homicidal ideation and denies hearing voices, is not delusional.  The patient was admitted in Psychiatry at [facility] in 2003 for substance abuse and was prescribed Lexapro and Seroquel.  Currently, she is not on any psych medication.  The patient denies using any other drugs like heroin or marijuana.  She also denies abusing alcohol.  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  PAST MEDICAL HISTORY:  History of lupus.  ALLERGIES:  No known drug allergies.  PHYSICAL EXAMINATION:  HEART:  Regular rate, rhythm, and volume.  S1 and S2 normal.  No murmur, gallop, or rub. LUNGS:  Clear bilaterally. ABDOMEN:  Soft, nontender, nondistended. NEUROLOGICAL:  Nonfocal.  Cranial nerves intact.  LABORATORY DATA:  Urine pregnancy test negative.  BUN 9, creatinine 0.6. Alcohol level less than 5, AST 13, ALT 12.  MENTAL STATUS EXAM:  The patient is calm, cooperative during the interview.  Fair eye contact.  No abnormal movements noticed.   No psychomotor agitation or retardation noted during the interview.  Mood depressed.  Affect mood congruent.  Speech is normal in rate, rhythm, and volume.  Thought process is logical and goal directed.  Thought content not suicidal or homicidal, not delusional.  Thought perception, no audiovisual hallucination reported, not internally preoccupied. Cognition alert, awake, oriented x3.  Memory immediate, recent remote fair.  Attention and concentration fair.  Abstraction ability fair. Insight and judgment fair.  DIAGNOSES:  AXIS I:  Chronic cocaine dependence, depressive disorder not otherwise specified. AXIS II:  Deferred. AXIS III:  See medical notes. AXIS IV:  Homeless, chronic substance abuse. AXIS V:  39.  RECOMMENDATIONS: 1. The patient will be started Celexa 20 mg p.o. daily to help for     depressive symptoms. 2. I told the case manager to look for rehab place for a patient.  The     patient is willing to go to a rehab.  Thanks for involving me in taking care of this patient.     Eulogio Ditch, MD     SA/MEDQ  D:  03/13/2011  T:  03/13/2011  Job:  [id number]  Electronically Signed by Eulogio Ditch  on 03/14/2011 05:10:36 PM

## 2011-03-15 LAB — CBC
HCT: 34 % — ABNORMAL LOW (ref 36.0–46.0)
MCH: 28 pg (ref 26.0–34.0)
MCHC: 33.2 g/dL (ref 30.0–36.0)
MCV: 84.2 fL (ref 78.0–100.0)
RDW: 13.1 % (ref 11.5–15.5)
WBC: 6.4 10*3/uL (ref 4.0–10.5)

## 2011-03-15 LAB — BASIC METABOLIC PANEL
BUN: 9 mg/dL (ref 6–23)
Creatinine, Ser: 0.54 mg/dL (ref 0.4–1.2)
GFR calc non Af Amer: >60 mL/min (ref >60)
Glucose, Bld: 104 mg/dL — ABNORMAL HIGH (ref 70–99)
Potassium: 3.9 mEq/L (ref 3.5–5.1)

## 2011-03-16 LAB — BASIC METABOLIC PANEL
BUN: 11 mg/dL (ref 6–23)
Creatinine, Ser: 0.51 mg/dL (ref 0.4–1.2)
GFR calc non Af Amer: >60 mL/min (ref >60)
Glucose, Bld: 91 mg/dL (ref 70–99)
Potassium: 3.6 mEq/L (ref 3.5–5.1)

## 2011-03-22 NOTE — Discharge Summary (Signed)
[name], [name]                  ACCOUNT NO.:  [id number]  MEDICAL RECORD NO.:  [id number]           PATIENT TYPE:  I  LOCATION:  2024                         FACILITY:  [facility]  PHYSICIAN:  Hartley Barefoot, MD    DATE OF BIRTH:  03-Jun-1971  DATE OF ADMISSION:  03/12/2011 DATE OF DISCHARGE:  03/16/2011                              DISCHARGE SUMMARY   DISCHARGE DIAGNOSES: 1. Encephalopathy secondary to fentanyl overdose. 2. Chronic pain. 3. Depressive disorder. 4. Leukocytoclastic vasculitis flare probably secondary to drug abuse. 5. Hypokalemia.  DISCHARGE MEDICATIONS: 1. Tylenol 650 mg p.o. every 8 h. as needed. 2. Celexa 20 mg p.o. daily. 3. Hydramine 25 mg every 8 h. as stated. 4. Ibuprofen 100 mg every 8 h. as needed. 5. Mupirocin ointment one application nasally twice daily. 6. Prednisone 20 mg tablet.  She will take 3 tablets for 3 days, then     2 tablets for 2 days, then 1 tablet for 1 day, then stop. 7. Sulfasalazine 1% cream one application topically every 6 h. as     needed. 8. Tramadol 150 mg 1 tablet by mouth twice daily.  DISPOSITION AND FOLLOWUP:  [name] was provided with appointment for alcohol and drug service on Mar 24, 2011, at 8:30 in the a.m.  She will need to follow with her primary care physician also.  BRIEF HISTORY OF PRESENT ILLNESS:     This is a 40 year old with past medical history of leukocytoclastic     vasculitis, cocaine abuse, polysubstance abuse, depression and     anxiety who was found with altered mental status.  The patient was     found in the bathroom by her family.  She was unresponsive.  First     reported that she had pulse, bradycardia and bradypneic.  She was     then apparently found to have a fentanyl patch and it was removed.     The patient took the fentanyl patch due to the pain, not     because of any suicidal ideation or homicidal ideation according to     the patient.  The patient will be admitted for overdose on   fentanyl.   1. Encephalopathy secondary to overdose.  The patient     did not require a Narcan drip.  This was an incidental overdose.     She denies any suicidal ideation, she did not try to overdose and     kill herself.  Psych was consulted.  She is going to be treated for     depression. 2. Leukocytoclastic vasculitis.  The patient was started on Solu-     Medrol.  Her rash has improved.  It seems that she had a biopsy in     2010, that showed the vasculitis.  Probably, the vascular is     related to her drug abuse.  ANA was negative.  HIV was negative.     Double strand DNA was at 17 less than that is considered negative.     Rash has improved.  No sign of infection.  The patient will be  discharged on prednisone taper dose for a course of days.  She was     initially treated with Solu-Medrol.  The patient complained of     itching and pain.  She was initially given morphine in the     hospital, but I will not provide Percocet to her.  I am going to     give her short course of tramadol 1 tablet twice a day.  I advised    the patient not to take this medication more often that what I     recommend.  She understood. 3. Cocaine abuse.  She is going to go for outpatient rehab.  On the     day of discharge, the patient was in improved condition.  Blood     pressure 132/76, sats 98 on room air, respirations 20, pulse 63 and     temperature 98.0.  LABS DAY PRIOR TO DISCHARGE:  ANA negative.  Sodium 139, potassium 3.6, chloride 103, bicarb 27, glucose 91, BUN 11, creatinine 0.5.  White blood cell 6.4, hemoglobin 11.3 and platelets 252.  The patient was discharged in improved condition.     Hartley Barefoot, MD     BR/MEDQ  D:  03/16/2011  T:  03/17/2011  Job:  [id number]  Electronically Signed by Hartley Barefoot MD on 03/22/2011 04:54:09 PM

## 2011-03-29 ENCOUNTER — Emergency Department (HOSPITAL_COMMUNITY)
Admission: EM | Admit: 2011-03-29 | Discharge: 2011-03-29 | Disposition: A | Discharge status: Home / Self Care | Payer: Self-pay | Attending: Emergency Medicine | Admitting: Emergency Medicine

## 2011-03-29 DIAGNOSIS — IMO0002 Reserved for concepts with insufficient information to code with codable children: Secondary | ICD-10-CM | POA: Insufficient documentation

## 2011-03-29 DIAGNOSIS — M329 Systemic lupus erythematosus, unspecified: Secondary | ICD-10-CM | POA: Insufficient documentation

## 2011-04-03 NOTE — Discharge Summary (Signed)
[name], [name]                  ACCOUNT NO.:  [id number]   MEDICAL RECORD NO.:  [id number]          PATIENT TYPE:  INP   LOCATION:  5020                         FACILITY:  [facility]   PHYSICIAN:  Charlestine Massed, MDDATE OF BIRTH:  1971-03-05   DATE OF ADMISSION:  06/06/2009  DATE OF DISCHARGE:  06/09/2009                               DISCHARGE SUMMARY   PRIMARY CARE PHYSICIAN:  Dr. Ignacia Palma.   RHEUMATOLOGIST:  Aundra Dubin, MD   DERMATOLOGY:  Dr. Mathews Robinsons of [facility]   REASON FOR ADMISSION:  Diffuse erythematous rash in both lower  extremities and at the angle of the mouth.   DISCHARGE DIAGNOSES:  1. Leukocytoclastic vasculitis - acute flare currently resolving.  2. Cause of leukocytoclastic vasculitis still under investigation.  3. History of depression, currently not suicidal.   DISCHARGE MEDICATIONS:  1. Prednisone 60 mg per day for 3 days and then 50 mg per day for 3      days and then 40 mg per day for 3 days and then 30 mg per day for 3      days and then continue at 20 mg daily until she sees Dr. Kellie Simmering.  2. Tylenol No. 3 - one tablet by mouth p.o. 3 times daily p.r.n. for      pain.  3. Continue existing medication - Prozac 20 mg p.o. daily.   HOSPITAL COURSE BY PROBLEM:  Acute erythematous rash due to  leukocytoclastic vasculitis - [name] is a 40 year old female who  came to the emergency room with complaints of diffuse erythematous rash  in both lower extremities and in the buttock area and in the back as  well as the upper abdomen on the skin.  There were few lesions from the  both upper extremities as well as angle of the mouth and the angle of  the eye.  The patient was seen in the emergency room and she said that  these things happened along a 3-day period.  She was treated in [facility] a month ago for what she states are similar lesions and she was  treated for possible chickenpox as well as herpes  zoster.   The patient was seen here by the Dermatology due to the diffuse  erythematous lesions, which had a possibly immunological origin.  Dermatology saw and had a punch biopsy taken which proved to be  leukocytoclastic vasculitis.  In view of this new diagnosis, the patient  was started on steroids in the hospital and she had a considerable  resolution of the rash and most of this small lesions faded off.  Dermatologist said that she can go on a tapering dose of steroid with a  low dose steroid at the end.  The patient needs to be seen by a  rheumatologist to find the exact cause why she is having  leukocytoclastic vasculitis.   The patient has had lab test drawn today for ANA, p-ANCA, c-ANCA, C3,  C4, separate hepatitis ABC panel, and myeloperoxidase antibody.  The lab  results can be followed with a rheumatologist where  the patient is going  to follow up as outpatient.   The patient's clinical condition has improved and she does not have much  pain even though there is some tender spots, so she was given pain  medication Tylenol No. 3 one tablet p.o. t.i.d. p.r.n. for pain, a total  of 20 tablet dispensed.  The patient has been advised to follow up with  rheumatology as outpatient.  She was also given prednisone as mentioned  in the medication section.   Test done during this admission, one is biopsy of the skin lesion, which  shows leukocytoclastic vasculitis.   DISPOSITION:  Discharged back home.   FOLLOWUP:  1. Follow up with Dr. Ignacia Palma as o/p in 1-2 weeks.  2. Follow up with Dr. Kellie Simmering on June 21, 2009, at 10:45 a.m., call      [phone] to get the address of the place.  3. If she experienced any further reaction, to go to the emergency      room right away.   DISCHARGE INSTRUCTIONS:  1. Adhere to the medication as prescribed and keep appointment      correctly.  2. Go to the emergency room if another flare happens.   A total of 40 minutes was spent on the  discharge.      Charlestine Massed, MD  Electronically Signed     UT/MEDQ  D:  06/09/2009  T:  06/10/2009  Job:  [id number]   cc:   Aundra Dubin, M.D.  Dr. Mathews Robinsons.

## 2011-04-03 NOTE — Op Note (Signed)
[name], [name]                  ACCOUNT NO.:  [id number]   MEDICAL RECORD NO.:  [id number]          PATIENT TYPE:  INP   LOCATION:  2550                         FACILITY:  [facility]   PHYSICIAN:  Suzanna Obey, M.D.       DATE OF BIRTH:  Oct 29, 1971   DATE OF PROCEDURE:  08/01/2007  DATE OF DISCHARGE:  08/01/2007                               OPERATIVE REPORT   PREOPERATIVE DIAGNOSIS:  Bilateral tonsillar abscess.   POSTOPERATIVE DIAGNOSIS:  Bilateral tonsillar abscess.   SURGICAL PROCEDURE:  Bilateral I&D of paratonsillar abscess.   ANESTHESIA:  General.   ESTIMATED BLOOD LOSS:  Approximately 10 mL.   INDICATIONS:  This is a 40 year old who has had a week history of sore  throat and now has developed significant increase in symptoms in the  last couple of days.  She underwent a CT scan in the emergency room.  It  showed bilateral paratonsillar abscesses.  She has had an abscess in the  past when she was younger.  She was informed of the risks and benefits  of the procedure, and options were discussed.  All questions were  answered, and consent was obtained.   DESCRIPTION OF THE PROCEDURE:  The patient was taken to the operating  room and placed in the supine position after adequate general  endotracheal tube anesthesia.  Was placed in the Rose position and  draped in the usual sterile manner.  Crowe-Davis mouth gag was inserted,  retracted, and suspended from the Mayo stand.  The right tonsil incision  was begun right above the tonsil and electrocautery was used.  Once the  incision was made, immediately a large amount of pus was expressed.  A  tonsillar hemostat was used to open it up and drain the pocket  completely.  The pocket was irrigated with saline.  The left side was  opened and had a small amount of purulent material irrigated and good  hemostasis.  The Crowe-Davis was removed.  The patient was awakened and  brought to recovery in stable condition.  Counts were  correct.           ______________________________  Suzanna Obey, M.D.     JB/MEDQ  D:  08/01/2007  T:  08/02/2007  Job:  [id number]
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
## 2011-04-04 ENCOUNTER — Encounter: Payer: Self-pay | Admitting: Internal Medicine

## 2011-04-04 ENCOUNTER — Emergency Department (HOSPITAL_COMMUNITY)
Admission: EM | Admit: 2011-04-04 | Discharge: 2011-04-04 | Disposition: A | Discharge status: Home / Self Care | Payer: Self-pay | Attending: Emergency Medicine | Admitting: Emergency Medicine

## 2011-04-04 ENCOUNTER — Inpatient Hospital Stay (HOSPITAL_COMMUNITY)
Admission: EM | Admit: 2011-04-04 | Discharge: 2011-04-07 | DRG: 547 | Disposition: A | Discharge status: Home / Self Care | Payer: Self-pay | Attending: Internal Medicine | Admitting: Internal Medicine

## 2011-04-04 DIAGNOSIS — N390 Urinary tract infection, site not specified: Secondary | ICD-10-CM | POA: Insufficient documentation

## 2011-04-04 DIAGNOSIS — F341 Dysthymic disorder: Secondary | ICD-10-CM | POA: Diagnosis present

## 2011-04-04 DIAGNOSIS — L989 Disorder of the skin and subcutaneous tissue, unspecified: Secondary | ICD-10-CM | POA: Insufficient documentation

## 2011-04-04 DIAGNOSIS — M310 Hypersensitivity angiitis: Principal | ICD-10-CM | POA: Diagnosis present

## 2011-04-04 DIAGNOSIS — T50Z95A Adverse effect of other vaccines and biological substances, initial encounter: Secondary | ICD-10-CM | POA: Diagnosis present

## 2011-04-04 DIAGNOSIS — I776 Arteritis, unspecified: Secondary | ICD-10-CM

## 2011-04-04 DIAGNOSIS — M329 Systemic lupus erythematosus, unspecified: Secondary | ICD-10-CM | POA: Insufficient documentation

## 2011-04-04 DIAGNOSIS — F1410 Cocaine abuse, uncomplicated: Secondary | ICD-10-CM | POA: Diagnosis present

## 2011-04-04 DIAGNOSIS — F411 Generalized anxiety disorder: Secondary | ICD-10-CM | POA: Insufficient documentation

## 2011-04-04 DIAGNOSIS — F17200 Nicotine dependence, unspecified, uncomplicated: Secondary | ICD-10-CM | POA: Diagnosis present

## 2011-04-04 LAB — CBC
HCT: 38.6 % (ref 36.0–46.0)
Hemoglobin: 12.7 g/dL (ref 12.0–15.0)
MCH: 27.4 pg (ref 26.0–34.0)
MCHC: 32.9 g/dL (ref 30.0–36.0)

## 2011-04-04 LAB — URINALYSIS, ROUTINE W REFLEX MICROSCOPIC
Bilirubin Urine: NEGATIVE
Bilirubin Urine: NEGATIVE
Glucose, UA: NEGATIVE mg/dL
Glucose, UA: NEGATIVE mg/dL
Hgb urine dipstick: NEGATIVE
Ketones, ur: NEGATIVE mg/dL
Ketones, ur: NEGATIVE mg/dL
Protein, ur: NEGATIVE mg/dL
Protein, ur: NEGATIVE mg/dL
pH: 7 (ref 5.0–8.0)

## 2011-04-04 LAB — DIFFERENTIAL
Basophils Relative: 0 % (ref 0–1)
Lymphocytes Relative: 17 % (ref 12–46)
Monocytes Absolute: 0.5 10*3/uL (ref 0.1–1.0)
Monocytes Relative: 5 % (ref 3–12)
Neutro Abs: 8.7 10*3/uL — ABNORMAL HIGH (ref 1.7–7.7)

## 2011-04-04 LAB — COMPREHENSIVE METABOLIC PANEL
ALT: 10 U/L (ref 0–35)
Alkaline Phosphatase: 93 U/L (ref 39–117)
CO2: 21 mEq/L (ref 19–32)
Glucose, Bld: 91 mg/dL (ref 70–99)
Potassium: 3.7 mEq/L (ref 3.5–5.1)
Sodium: 136 mEq/L (ref 135–145)
Total Protein: 6.8 g/dL (ref 6.0–8.3)

## 2011-04-04 LAB — URINE MICROSCOPIC-ADD ON

## 2011-04-04 LAB — RAPID URINE DRUG SCREEN, HOSP PERFORMED
Amphetamines: NOT DETECTED
Barbiturates: NOT DETECTED

## 2011-04-04 LAB — POCT PREGNANCY, URINE: Preg Test, Ur: NEGATIVE
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
## 2011-04-05 LAB — URINE CULTURE: Culture  Setup Time: 201205160857

## 2011-04-05 LAB — URINALYSIS, ROUTINE W REFLEX MICROSCOPIC
Bilirubin Urine: NEGATIVE
Glucose, UA: NEGATIVE mg/dL
Nitrite: NEGATIVE
Protein, ur: NEGATIVE mg/dL
Urobilinogen, UA: 0.2 mg/dL (ref 0.0–1.0)
pH: 7 (ref 5.0–8.0)

## 2011-04-05 LAB — COMPREHENSIVE METABOLIC PANEL
BUN: 9 mg/dL (ref 6–23)
Calcium: 9.1 mg/dL (ref 8.4–10.5)
Creatinine, Ser: <0.47 mg/dL (ref 0.4–1.2)
Glucose, Bld: 158 mg/dL — ABNORMAL HIGH (ref 70–99)
Sodium: 137 mEq/L (ref 135–145)
Total Protein: 6.2 g/dL (ref 6.0–8.3)

## 2011-04-05 LAB — CBC
HCT: 36.9 % (ref 36.0–46.0)
MCH: 28 pg (ref 26.0–34.0)
MCHC: 33.6 g/dL (ref 30.0–36.0)
MCV: 83.3 fL (ref 78.0–100.0)
RDW: 14.5 % (ref 11.5–15.5)

## 2011-04-05 LAB — MRSA PCR SCREENING: MRSA by PCR: NEGATIVE

## 2011-04-05 NOTE — H&P (Signed)
[name], [name]                  ACCOUNT NO.:  [id number]  MEDICAL RECORD NO.:  [id number]           PATIENT TYPE:  LOCATION:                                 FACILITY:  PHYSICIAN:  Massie Maroon, MD        DATE OF BIRTH:  [birth year]  DATE OF ADMISSION: DATE OF DISCHARGE:                             HISTORY & PHYSICAL   CHIEF COMPLAINT:  Overdose.  HISTORY OF PRESENT ILLNESS:  This is a 40 year old female with a history of leukocytoclastic vasculitis, cocaine abuse, polysubstance abuse, depression/anxiety apparently was found with altered mental status.  The patient was found in the bathroom by her family.  She was unresponsive. First reported that she had a pulse but was bradycardic and bradypneic. The patient was bagged.  She was then apparently found to have a sentinel patch and it was removed.  The patient took the fentanyl patch due to the pain not because of any suicidal ideation or homicidal ideation according to the patient..  The patient will be admitted for overdose on fentanyl.  PAST MEDICAL HISTORY: 1. Leukocytoclastic vasculitis. 2. Cocaine abuse. 3. Polysubstance abuse. 4. Depression. 5. Anxiety.  PAST SURGICAL HISTORY: 1. Ankle surgery from crush injury. 2. Cholecystectomy.  SOCIAL HISTORY:  The patient is 3.  She still smokes one-pack per day x13 years.  She uses cocaine.  The patient has one daughter.  She also has a sister.  She is currently homeless and disabled.  She admits to using crack cocaine but wants to quit.  FAMILY HISTORY:  Noncontributory with no known history of autoimmune disorders.  ALLERGIES:  No known drug allergies.  MEDICATIONS:  Percocet, prednisone (medications at unknown by the patient).  REVIEW OF SYSTEMS:  Negative for all 10 organ systems except for pertinent positives as stated above.  PHYSICAL EXAMINATION:  VITAL SIGNS:  Temperature afebrile, pulse 97, blood pressure 137/94, pulse ox 100% on room air HEENT:   Anicteric. NECK:  No JVD.  No bruit. HEART:  Regular rate and rhythm.  S1 and S2.  No murmurs, gallops, or rubs. LUNGS:  Clear to auscultation bilaterally. ABDOMEN:  Soft, nontender, nondistended.  Positive bowel sounds. EXTREMITIES:  No cyanosis or clubbing.  No edema. SKIN:  There are erythematous plaques scattered across her buttocks and over her arms and chest. LYMPH:  No lymphadenopathy, NEUROLOGIC:  Nonfocal.  Cranial nerves II through XII intact.  Reflexes 2+, symmetric, diffuse with downgoing toes bilaterally, motor strength 5/5 in all 4 extremities, pinprick intact.  LABORATORY DATA:  Urine drug screen, cocaine positive, benzodiazepine positive, opiate positive.  Urine pregnancy test negative.  ABG, pH 7.371, pCO2 42.9, pO2 104. Salicylate less than 4.  Alcohol level less than 5.  Tylenol level less than 10.  BUN 9, creatinine 0.6.  ESR 38.  Occult blood negative. Urinalysis negative.  AST 13, ALT 12.  Chest x-ray negative for any acute process.  ASSESSMENT AND PLAN: 1. Overdose on fentanyl:  Fentanyl patch has been removed.  We will     observe her overnight.  I do not think that she requires a Narcan  drip at this point in time. 2. Leukocytoclastic vasculitis:  Please obtain a dermatology consult     in the morning. 3. History of polysubstance abuse:  The patient was counseled to stop     using cocaine. 4. Tobacco dependence:  The patient was counseled on stopping smoking. 5. Deep vein thrombosis prophylaxis.  SCDs.     Massie Maroon, MD     JYK/MEDQ  D:  03/13/2011  T:  03/13/2011  Job:  [id number]  Electronically Signed by Pearson Grippe MD on 04/05/2011 06:55:59 AM

## 2011-04-06 LAB — CBC
HCT: 32.8 % — ABNORMAL LOW (ref 36.0–46.0)
Hemoglobin: 10.8 g/dL — ABNORMAL LOW (ref 12.0–15.0)
MCV: 84.5 fL (ref 78.0–100.0)
RBC: 3.88 MIL/uL (ref 3.87–5.11)
RDW: 15.2 % (ref 11.5–15.5)
WBC: 10.8 10*3/uL — ABNORMAL HIGH (ref 4.0–10.5)

## 2011-04-06 NOTE — Op Note (Signed)
[name]                  ACCOUNT NO.:  [id number]   MEDICAL RECORD NO.:  [id number]          PATIENT TYPE:  OIB   LOCATION:  1610                         FACILITY:  [facility]   PHYSICIAN:  Anselm Pancoast. Weatherly, M.D.DATE OF BIRTH:  [birth year]   DATE OF PROCEDURE:  06/07/2006  DATE OF DISCHARGE:                                 OPERATIVE REPORT   PREOPERATIVE DIAGNOSIS:  Acute cholecystitis.   POSTOPERATIVE DIAGNOSES:  Acute cholecystitis, small abscess abdominal wall.   OPERATIONS:  Laparoscopic cholecystectomy with cholangiogram and I&D of  small abscess and culture abdominal wall.   ANESTHESIA:  General.   SURGEON:  Anselm Pancoast. Zachery Dakins, M.D.   ASSISTANT:  Leonie Man, M.D.   HISTORY:  [name] is a 40 year old female who was referred over from  [facility] about 5:30 this morning after she presented there last  evening with the following history.  She had had low onset of abdominal  pain.  I think she had her period last weekend, the patient was laying  around taking it easy on Friday, and then over a period of about two days,  she stated that she thought she was feeling better, then on Tuesday, she was  not having that much discomfort, but then had the onset of significant pain  the following day, but this time the pain was more on the right upper  instead of the right lower.  She decided to go to [facility] and she  was seen there by the nurse practitioner who obtained a pelvic ultrasound, a  pelvic exam, laboratory studies.  She did have a left shift and an elevated  white count and then an ultrasound of the gallbladder that showed acute  thickening of the gallbladder with a stones looked like impacted in the neck  of the gallbladder.  Her liver tests were slightly elevated and on the  ultrasound they questioned whether this could be of MRSA.  I was called  about 5, suggesting that she be transferred on over here to [facility] and  saw her about 30  minutes later.  I started her on intravenous antibiotics.  She was definitely mildly tender and not acutely ill and the first OR  available time was approximately 12:30.  Preoperatively, she was given a  second dose of Unasyn.  I discussed with the pharmacist preoperatively that  she had a little pustule of the abdominal wall that is kind of halfway  between the umbilicus and where the 5 mm ports are that she said she had  kind of pressed on or drained the preceding day.  There is no other evidence  of superficial abscesses and I talked with the pharmacist about possibly  their recommendation since she does have Trichomonas, and they were saying  unless we could definitely prove that this was MRSA, they would not put her  on vancomycin.   The patient was taken to the operative suite with the induction of general  anesthesia, endotracheal tube, oral tube into the stomach, and then the  abdomen was prepped with Betadine surgical scrub solution  and draped in a  sterile manner.  A small incision was made below the umbilicus.  She has had  a previous laparoscopic GYN procedure and the fascia was identified.  This  was picked up between two small Kochers and then carefully opened into the  peritoneal cavity.  A pursestring suture of 0-Vicryl was placed.  The Hasson  cannula was induced and the carbon dioxide infused and the gallbladder was  definitely tense and acutely inflamed.  The upper 10 mL trocar was placed  the subxiphoid area after anesthetizing the fascia and Dr. Lurene Shadow placed the  two lateral 5 mm trocars at the appropriate position.  The gallbladder was  retracted upward and outward and it was markedly inflamed with a stone  impacted in the neck, but we could kind of carefully open the peritoneum.  The left lobe of the liver was kind of partially occluding the view and we  kind of opened up the peritoneum and then could kind of unfold the proximal  portion of the gallbladder cystic  duct junction.  There was a little blood  vessel anterior that whether this was a branch of the cystic artery or not,  I could not tell.  Doubly clipped proximally, singly distally and divided,  and then we could dissect a cystic duct.  This was encompassed, clipped  flush, and then a Cook catheter was introduced in the proximal cystic duct  and an x-ray obtained.  It showed good prompt filling of the extrahepatic  biliary system and good flow into the duodenum.  The catheter was removed.  The cystic duct was triply clipped and divided and then we could identify  what I think is the main cystic artery, and this was doubly clipped  proximally and singly distally divided.  We then went ahead and freed up the  gallbladder completely.  There was a little bit of clear bile when we were  dissecting the marked inflamed from the gallbladder wall and then after the  gallbladder was freed placed into an EndoCatch bag.  The upper 10 mm trocar  camera was placed there and then the bag containing the gallbladder was  brought out through the umbilicus.  I placed an additional figure-of-eight  of 0-Vicryl in the fascia of the umbilicus and anesthetized this.  Next, the  carbon dioxide was released.  There was good hemostasis.  The upper 5 mm  trocar was removed under direct vision and the upper 10 mL trocar was  withdrawn.  I did place a single stitch in the fascia at the upper umbilicus  since I could put my finger into the peritoneal cavity through it.  The  subcutaneous wounds were closed with 4-0 Vicryl and Benzoin and Steri-Strips  on the skin.  After all the surgery was completed, I then took a 15 blade,  and kind of lanced this little pustule, and was surprised that there was  probably a good 5 or 6 drops of frank pus that came forth and then I left  this area open.  Using a hemostat, I kind of broke up if there was any little loculations and put Betadine ointment on the area.  This culture was   sent for an aerobic culture.  The surgery completed, the patient was sent to  recovery room in stable postop condition.  I am going to keep her on the  Flagyl and the Unasyn and we will make a decision in the morning on what to  discharge  her on according to what the little pustule looked like.           ______________________________  Anselm Pancoast. Zachery Dakins, M.D.     WJW/MEDQ  D:  06/06/2006  T:  06/07/2006  Job:  [id number]
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
## 2011-04-06 NOTE — Op Note (Signed)
[name]  Patient:    [name], [name]                         MRN: [id number] Proc. Date: 06/21/00 Adm. Date:  [id number] Disc. Date: [id number] Attending:  Antionette Char                           Operative Report  PREOPERATIVE DIAGNOSES:       Multiparity, desires permanent sterilization.  POSTOPERATIVE DIAGNOSES:      Multiparity, desires permanent sterilization.  PROCEDURE:                    Postpartum tubal ligation, modified Pomeroy method.  SURGEON:                      Roseanna Rainbow, M.D.  ANESTHESIA:                   General endotracheal.  COMPLICATIONS:                None.  ESTIMATED BLOOD LOSS:         Less than 50 cc.  FLUIDS:                       As per anesthesia.  INDICATIONS:                  The patient is a 40 year old multiparous who desires permanent sterilization.  The risks and benefits of the procedure were discussed with the patient including a risk of failure of 4 to 8 per 1000, with increased risk of an ectopic gestation if pregnancy occurs.  FINDINGS:                     Normal tubes and ovaries.  DESCRIPTION OF PROCEDURE:     The patient was taken to the operating room where a general anesthesia was induced without difficulty.  A small transverse infraumbilical skin incision was then made with a scalpel.  The incision was carried down through the underlying fascia until the parietal peritoneum was identified and entered.  The peritoneum was noted to be free of any adhesions. The patients left fallopian tube was then identified, brought to the incision and grasped with a Babcock clamp.  The tube was followed out to the fimbria. The Babcock clamp was then used to grasp the tube approximately 4 cm from the cornual region.  A 2-cm segment of tube was then doubly ligated with free ties of plain gut and excised.  Excellent hemostasis was noted and the tube returned to the abdomen.  The right fallopian  tube was then manipulated in a similar fashion.  The tube was returned to the abdomen.  The parietal peritoneum and fascia were closed in a single layer using 0 Vicryl.  The skin was closed in a subcuticular fashion using 3-0 Vicryl.  The patient tolerated the procedure well.  Sponge, lap and needle counts were correct x 2.  The patient was taken to the PACU in stable condition.  PATHOLOGY:                    Segments of right and left fallopian tubes. DD:  06/21/00 TD:  06/24/00 Job: [id number]

## 2011-04-06 NOTE — Op Note (Signed)
[place]. [facility]  Patient:    [name], [name] Visit Number: [id number] MRN: [id number]          Service Type: SUR Location: 5000 5041 01 Attending Physician:  Lubertha South Dictated by:   Kerrin Champagne, M.D. Proc. Date: 01/09/02 Admit Date:  10/31/2001 Discharge Date: 11/03/2001                             Operative Report  PREOPERATIVE DIAGNOSIS:  Retained screws, right ankle, status post right ankle fusion with sliding anterior tibial graft, with impingement of screws on the subtalar joint.  POSTOPERATIVE DIAGNOSIS:  Retained screws, right ankle, status post right ankle fusion with sliding anterior tibial graft, with impingement of screws on the subtalar joint.  PROCEDURE:  Removal of two 6.5 Ace cannulated screws from the right ankle fusion.  SURGEON:  Kerrin Champagne, M.D.  ASSISTANTJill Side P. Mahar, P.A.  ANESTHESIA:  GOT, David A. Ivin Booty, M.D.  ESTIMATED BLOOD LOSS:  10 cc.  DRAINS:  None.  TOURNIQUET TIME:  Zero minutes.  BRIEF CLINICAL HISTORY:  The patient is a 40 year old female who underwent previous ORIF of her right talar neck fracture, which was displaced with subtalar dislocation.  She eventually underwent removal of hardware from the talar neck fracture site and has since undergone avascular necrosis of her talar dome.  She was brought to the operating room approximately two months ago and underwent a right ankle fusion using a sliding anterior tibial bone graft and internal fixation using two crossed 6.5 Ace cannulated screws. Screws were placed in such a way as to obtain maximum purchase on the talus that remained.  However, they did show some impingement on the subtalar joint. She was brought back to the operating room to undergo removal of these prior to weightbearing.  INTRAOPERATIVE FINDINGS:  The patients screws were easily found using C-arm fluoroscopy, removed percutaneously.  The fusion appeared solid on  AP and lateral radiographs.  DESCRIPTION OF PROCEDURE:  After adequate general anesthesia, the right lower extremity prepped with Duraprep solution from the toes to the knee, draped in the usual manner.  C-arm fluoroscopy was brought into the field.  Under C-arm fluoroscopy, an incision was made over the medial aspect of the distal tibia just posterior to the saphenous vein in line with the 6.5 screw head noted on C-arm fluoroscopy.  The incision length was approximately 0.75 cm.  After incision with a 15 blade scalpel, subcu layers then dissected bluntly using a hemostat down to the screw head.  A small screwdriver was first inserted and then the large screwdriver inserted with the 6.5 driver in place and then the screws removed.  C-arm fluoroscopy was used to ascertain the placement of the screwdriver on the screw head and its removal.  The lateral screw was then identified with the leg internally rotated and another stab incision made, this one anterior laterally placed.  Again approximately a 0.75 cm incision longitudinal through the skin and subcu layers, then a hemostat used to spread the deeper structures down to bone and to the screw head.  A small screwdriver, then a large screwdriver inserted and the screw then removed under C-arm fluoroscopy.  Pressure applied to these areas and then when bleeding was controlled with just simple pressure over these areas, the incisions were closed after infiltration with Marcaine 0.5% plain.  These were closed using inverted sutures of 3-0 Vicryl subcuticular.  Tincture of benzoin and Steri-Strips applied, then Adaptic, 4 x 4s, ABD pad, and a well-padded posterior splint applied to the right lower extremity from the toes to the upper posterior calf.  The patient was then reactivated, returned to the recovery room in satisfactory condition, all instrument and sponge counts were correct. Dictated by:   Kerrin Champagne, M.D. Attending Physician:   Lubertha South DD:  01/09/02 TD:  01/09/02 Job: [id number]

## 2011-04-06 NOTE — H&P (Signed)
NAMEYOSHIE, KOSEL                  ACCOUNT NO.:  [id number]   MEDICAL RECORD NO.:  [id number]          PATIENT TYPE:  MAT   LOCATION:  MATC                          FACILITY:  [facility]   PHYSICIAN:  Anselm Pancoast. Weatherly, M.D.DATE OF BIRTH:  06/04/1971   DATE OF ADMISSION:  06/06/2006  DATE OF DISCHARGE:                                HISTORY & PHYSICAL   CHIEF COMPLAINT:  Right-sided abdominal pain.   HISTORY:  [name] is a 40 year old Caucasian female who stated she  started having abdominal pain predominantly right-sided lower abdomen last  Friday.  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  She said about Tuesday,  she thought she felt better but then she started having more pain in the  upper abdomen and when this became very severe, went to [facility] in  the early morning.  Today, June 06, 2006, about midnight, she was seen by  the nurse practitioner.  A pelvic exam and pelvic ultrasound performed and  also upper abdominal ultrasound performed after laboratory studies.  She was  described as having a general vaginal discharge with trichinosis.  Tenderness was more in the upper abdomen and the ultrasound of the  gallbladder showed a thickened gallbladder with a stone lodged in the neck  of the gallbladder and a mildly dilated common bile duct.  I was called  about 4 a.m. with these findings and she was transferred to [facility] for gallbladder management.  The patient states she has  no allergies and I do not think she is on any chronic medications.  In the  emergency room at [facility], her laboratory studies, her amylase was  normal.  Her pelvic ultrasound was described as normal.  She was tender  according to the nurse practitioner's pelvic examination, but not as tender  as the upper abdomen and her white count 14,900 with hematocrit of 38.5 and  liver function studies  are mildly elevated SGOT of 227, SGPT of 71 and total  bilirubin is 1.2.  Electrolytes and BUN are normal as is her glucose.   The patient  was transferred over to [facility] emergency department, is  now approximately 5:30, 5:45 when I examined her in the emergency room.  Pulse was elevated at [facility] but she has received pain medication and it is  not significantly elevated at this time.   PHYSICAL EXAMINATION:  GENERAL:  She is a Caucasian female, looks a little  older than stated age.  VITAL SIGNS:  She was afebrile at [facility].  Blood pressure was normal.  It  was a tachycardia when she initially checked in.  LUNGS:  Breath sounds clear bilaterally.  HEART:  Sinus tachycardia.  ABDOMEN:  She is definitely tender in the upper right abdomen. She has also  got a pustule to the right of the umbilicus, not really in the operative  field but between where the umbilical and the 5 mm ports will be placed.  She says she popped  it yesterday and it is a small abscess.   As far as pelvic exam, I did not repeat it.  EXTREMITIES:  Lower extremities, she is not edematous and she has got good  peripheral pulses.  CNS:  Physiologic.   ADMISSION IMPRESSION:  Acute cholecystitis, possible vaginal trichomonas and  pustule of abdominal wall.   PLAN:  The patient will be kept n.p.o.  3 g of Unasyn intravenously has been  ordered and we will add her to the operating room schedule this morning for  laparoscopic cholecystectomy and cholangiogram.  PAS stockings will be used.  I will talk to the pharmacist about which antibiotic is best on trichomonas  vaginal discharge.           ______________________________  Anselm Pancoast. Zachery Dakins, M.D.     WJW/MEDQ  D:  06/06/2006  T:  06/06/2006  Job:  [id number]
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
## 2011-04-07 DIAGNOSIS — I776 Arteritis, unspecified: Secondary | ICD-10-CM

## 2011-04-07 LAB — BASIC METABOLIC PANEL
BUN: 17 mg/dL (ref 6–23)
CO2: 32 mEq/L (ref 19–32)
Chloride: 100 mEq/L (ref 96–112)
Glucose, Bld: 81 mg/dL (ref 70–99)
Potassium: 3.8 mEq/L (ref 3.5–5.1)

## 2011-04-17 ENCOUNTER — Encounter: Payer: Self-pay | Admitting: Internal Medicine

## 2011-04-17 NOTE — Progress Notes (Signed)
DOA: 04/04/11 DOD: 04/07/11  Discharge update: 40 y/o woman with PMH significant for polysubstance abuse presented with leukocytoclastic flare. She had diffuse rash all over her body but the lesions were more dense on buttocks. The rash was like a palpable purpura. She was treated with IV steroids that was transitioned to PO with improvement in her rash. She was d/c on tapering dose of steroids and some pain medications. It seems like she has some underlying chronic pain syndrome and depression. We also started on anti- depressant meds during this admission. Please do not hesitate to page me if you have any questions( my pager number is [phone]).  Meds: reviewed  Problem list: Reviewed.

## 2011-04-19 ENCOUNTER — Encounter: Payer: Self-pay | Admitting: Internal Medicine

## 2011-04-23 NOTE — Discharge Summary (Signed)
[name], [name]                  ACCOUNT NO.:  [id number]  MEDICAL RECORD NO.:  [id number]           PATIENT TYPE:  I  LOCATION:  5001                         FACILITY:  [facility]  PHYSICIAN:  Blanch Media, M.D.DATE OF BIRTH:  June 18, 1971  DATE OF ADMISSION:  04/04/2011 DATE OF DISCHARGE:  04/07/2011                              DISCHARGE SUMMARY   DISCHARGE DIAGNOSES: 1. 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 2. Depression. 3. Chronic pain syndrome. 4. Recent admission for encephalopathy secondary to fentanyl overdose. 5. Tobacco abuse.  DISCHARGE MEDS WITH ACCURATE DOSES: 1. Prednisone taper, take 50 mg of prednisone for day 1, take 40 mg of     prednisone for day 2, next 30 mg of prednisone for day 3, 20 mg of     prednisone for day 4, 10 mg for day 5 and then stop. 2. Oxycodone 5 mg IR tablet, take 1 tablet by mouth every 4 hour as     needed for pain. 3. Diphenhydramine 25 mg take 1 tablet every 8 hours as needed for     itching. 4. Ibuprofen 400 mg, take 1 tablet by mouth every 8 hours as needed     for pain. 5. Silver sulfadiazine 1% cream apply topically to the affected area     every 6 hours as needed. 6. Tramadol 50 mg take 1 tablet by mouth twice daily. 7. Tylenol 325 mg take 2 tablets by mouth every 8 hours as needed for     pain.  DISPOSITION AND FOLLOWUP:  The patient was discharged home from [facility] in stable and improved condition on Apr 07, 2011.  The patient has been scheduled a followup appointment with Dr. Tonny Branch on Apr 19, 2011 at 3:45 p.m.  At that time her rash needs to be reevaluated.  She should be counseled on polysubstance abuse and if needed social worker consult should be made for counseling for her polysubstance abuse.  PROCEDURES PERFORMED:  None.  CONSULTATIONS:  None.  ADMITTING HISTORY AND PHYSICAL:  Ms. [name] is a 40 year old woman with past medical history  significant for vasculitis, polysubstance abuse notable for cocaine and depression who presented to the ER for pain and rash.  The patient states she woke up at 4:00 a.m. and noticed her skin broken out.  The bumps on her skin located all over her body, though are most prominent over the buttock region.  She also had lesions over her hand and states her hands feel like they are on fire.  Additionally she complains of pain in her knees, ankles, wrists, and ankles.  Her last flare was in April which was mild and previously in February where the flare was similar in presentation to her current condition.  In the past it was thought to be secondary to of her cocaine use . Her last cocaine  use for this admission was  2-1/2 weeks and her UDS was negative for cocaine.   She does mention that she got into an argument with her significant other and  believes her emotional stress  may have triggered the onset of flare.  She denies  any flu-like symptoms.  No fever, chills, cough, shortness of breath, chest pain, or changes in bowel or urinary habits.  VITAL SIGNS:  Temperature 97.2, respiratory rate 22, heart rate 103, blood pressure 164/113, O2 sats 99% on room air. PHYSICAL EXAMINATION:  GENERAL APPEARANCE:  Alert, cooperative in moderate distress. EYES:  Pupils are equal, round, reactive to light, extraocular movements intact. LUNGS:  Clear to auscultation bilaterally. HEART:  Tachycardic, no murmurs, rubs or gallops. EXTREMITIES:  No edema noted in hand, but the patient had palpable lesions with no inflammation present.  Pulses 2+ and symmetric. SKIN:  Palpable purpura covering over upper and lower extremities and trunk, large coalescent on the left posterior hip and hindquarters.  LABORATORY DATA:  WBC 11.1, hemoglobin 12.7, hematocrit 38.6, platelets 318,000, MCV 80.4, absolute neutrophil count of 8.7 and percentage neutrophil 78.  Sodium 136, potassium 3.7, chloride 101, bicarb 21,  BUN 9, creatinine 0.49, calcium 8.9.  UDS was positive for benzos and tetrahydrocannabinol.  HOSPITAL COURSE BY PROBLEM: 1. Leukocytoclastic vasculitis flare.  The patient had another episode     of vasculitis flare likely secondary to cocaine abuse or levamisole     toxicity.  The patient does endorse that she did cocaine about 2-     1/2 weeks ago which could have been a trigger to her vasculitis     flare.  We were also checking her levamisole levels as there have     been many cases reports from levamisole induced vasculitis in the     literature though her levamisole levels were pending at the time of     discharge.  The patient was started on prednisone taper and her     rash started getting better on day 2 of her hospitalization.  The     patient was noted having less erythema, itching, and her palpable     purpura subsided with steroids.  For her pain, the patient was     started on oxycodone, morphine, and Tylenol and she required all these      meds in significant amounts during her hospital stay.  It seemed     the patient also has some underlying chronic pain syndrome because     of which she was constantly requiring these pain medications and given      her admission for fenatny overdose in the past.  The     patient was discharged home on oxycodone and tramadol and she was     given a limited drug supply for 7 days.  For her rash the patient     was also given some diphenhydramine. 2. Depression.  The patient was noticed having flat affect and she was     very tearful during many encounters.  The patient was started back     on Celexa which was helping with her mood. 3. Polysubstance abuse: We councelled her on her PSA abuse and social worker     consult was also done to discuss various aids. She says that she has promised     her daughter that she will not use cocaine any more .  DISCHARGE LABS AND VITALS:  Her discharge vitals include temperature 97.8, pulse 66,  respiratory rate 18, blood pressure 122/83, O2 sats 94% on room air.  Her discharge labs include sodium 138, potassium 3.8, chloride 100, bicarb 32, glucose 81, BUN 17, creatinine of 0.47.    ______________________________ Shelby Lesches  Dorthula Rue, MD   ______________________________ Blanch Media, M.D.    MS/MEDQ  D:  04/08/2011  T:  04/09/2011  Job:  [id number]  cc:   [facility]  Electronically Signed by Elyse Jarvis MD on 04/16/2011 02:34:08 PM Electronically Signed by Blanch Media M.D. on 04/23/2011 12:03:00 PM

## 2011-07-16 ENCOUNTER — Emergency Department (HOSPITAL_COMMUNITY)
Admission: EM | Admit: 2011-07-16 | Discharge: 2011-07-16 | Disposition: A | Discharge status: Home / Self Care | Payer: Self-pay | Attending: Emergency Medicine | Admitting: Emergency Medicine

## 2011-07-16 DIAGNOSIS — L089 Local infection of the skin and subcutaneous tissue, unspecified: Secondary | ICD-10-CM | POA: Insufficient documentation

## 2011-07-16 DIAGNOSIS — K625 Hemorrhage of anus and rectum: Secondary | ICD-10-CM | POA: Insufficient documentation

## 2011-07-16 DIAGNOSIS — K5900 Constipation, unspecified: Secondary | ICD-10-CM | POA: Insufficient documentation

## 2011-07-16 DIAGNOSIS — M329 Systemic lupus erythematosus, unspecified: Secondary | ICD-10-CM | POA: Insufficient documentation

## 2011-07-16 DIAGNOSIS — R350 Frequency of micturition: Secondary | ICD-10-CM | POA: Insufficient documentation

## 2011-07-16 LAB — URINALYSIS, ROUTINE W REFLEX MICROSCOPIC
Glucose, UA: NEGATIVE mg/dL
Hgb urine dipstick: NEGATIVE
Leukocytes, UA: NEGATIVE
Specific Gravity, Urine: 1.013 (ref 1.005–1.030)
pH: 7 (ref 5.0–8.0)

## 2011-07-16 LAB — POCT I-STAT, CHEM 8
BUN: 7 mg/dL (ref 6–23)
Chloride: 104 mEq/L (ref 96–112)
Creatinine, Ser: 0.7 mg/dL (ref 0.50–1.10)
Potassium: 4 mEq/L (ref 3.5–5.1)
Sodium: 136 mEq/L (ref 135–145)

## 2011-07-16 LAB — POCT PREGNANCY, URINE: Preg Test, Ur: NEGATIVE

## 2011-07-16 LAB — OCCULT BLOOD, POC DEVICE: Fecal Occult Bld: POSITIVE

## 2011-08-08 LAB — COMPREHENSIVE METABOLIC PANEL
ALT: 10
Albumin: 3.5
Alkaline Phosphatase: 59
BUN: 6
Chloride: 105
Glucose, Bld: 91
Potassium: 3.7
Sodium: 139
Total Bilirubin: 0.3
Total Protein: 6.8

## 2011-08-08 LAB — DIFFERENTIAL
Basophils Absolute: 0
Basophils Relative: 0
Eosinophils Absolute: 0.4
Monocytes Absolute: 0.5
Monocytes Relative: 6
Neutro Abs: 5.5
Neutrophils Relative %: 65

## 2011-08-08 LAB — CBC
HCT: 38.3
Hemoglobin: 13
WBC: 8.4

## 2011-08-29 LAB — CULTURE, ROUTINE-ABSCESS

## 2011-08-31 LAB — I-STAT 8, (EC8 V) (CONVERTED LAB)
BUN: 8
Chloride: 106
Glucose, Bld: 103 — ABNORMAL HIGH
Hemoglobin: 14.6
Potassium: 3.8
Sodium: 139
TCO2: 28
pH, Ven: 7.388 — ABNORMAL HIGH

## 2011-08-31 LAB — POCT I-STAT CREATININE: Operator id: 288331

## 2011-08-31 LAB — CBC
HCT: 40.4
Hemoglobin: 13.8
MCV: 90.6
RBC: 4.46
WBC: 12.8 — ABNORMAL HIGH

## 2011-08-31 LAB — DIFFERENTIAL
Eosinophils Absolute: 0.2
Eosinophils Relative: 2
Lymphocytes Relative: 14
Lymphs Abs: 1.8
Monocytes Absolute: 0.7
Monocytes Relative: 5

## 2011-09-04 LAB — I-STAT 8, (EC8 V) (CONVERTED LAB)
Acid-base deficit: 3 — ABNORMAL HIGH
Glucose, Bld: 108 — ABNORMAL HIGH
HCT: 39
Hemoglobin: 13.3
Operator id: 282201
Potassium: 3.2 — ABNORMAL LOW
Sodium: 139
TCO2: 25

## 2011-09-04 LAB — CBC
Hemoglobin: 12.2
RDW: 12.6
WBC: 17 — ABNORMAL HIGH

## 2011-09-04 LAB — POCT I-STAT CREATININE: Operator id: 282201

## 2011-09-04 LAB — DIFFERENTIAL
Basophils Absolute: 0
Lymphocytes Relative: 7 — ABNORMAL LOW
Lymphs Abs: 1.1
Monocytes Absolute: 0.9 — ABNORMAL HIGH
Neutro Abs: 14.8 — ABNORMAL HIGH

## 2011-09-28 ENCOUNTER — Emergency Department (HOSPITAL_COMMUNITY)
Admission: EM | Admit: 2011-09-28 | Discharge: 2011-09-28 | Disposition: A | Discharge status: Home / Self Care | Payer: Self-pay | Attending: Emergency Medicine | Admitting: Emergency Medicine

## 2011-09-28 ENCOUNTER — Encounter (HOSPITAL_COMMUNITY): Payer: Self-pay | Admitting: *Deleted

## 2011-09-28 DIAGNOSIS — M25569 Pain in unspecified knee: Secondary | ICD-10-CM | POA: Insufficient documentation

## 2011-09-28 DIAGNOSIS — R21 Rash and other nonspecific skin eruption: Secondary | ICD-10-CM

## 2011-09-28 DIAGNOSIS — G8929 Other chronic pain: Secondary | ICD-10-CM | POA: Insufficient documentation

## 2011-09-28 DIAGNOSIS — R5381 Other malaise: Secondary | ICD-10-CM | POA: Insufficient documentation

## 2011-09-28 DIAGNOSIS — M329 Systemic lupus erythematosus, unspecified: Secondary | ICD-10-CM | POA: Insufficient documentation

## 2011-09-28 DIAGNOSIS — Z79899 Other long term (current) drug therapy: Secondary | ICD-10-CM | POA: Insufficient documentation

## 2011-09-28 DIAGNOSIS — L2989 Other pruritus: Secondary | ICD-10-CM | POA: Insufficient documentation

## 2011-09-28 DIAGNOSIS — F329 Major depressive disorder, single episode, unspecified: Secondary | ICD-10-CM | POA: Insufficient documentation

## 2011-09-28 DIAGNOSIS — M2540 Effusion, unspecified joint: Secondary | ICD-10-CM | POA: Insufficient documentation

## 2011-09-28 DIAGNOSIS — M25579 Pain in unspecified ankle and joints of unspecified foot: Secondary | ICD-10-CM | POA: Insufficient documentation

## 2011-09-28 DIAGNOSIS — F3289 Other specified depressive episodes: Secondary | ICD-10-CM | POA: Insufficient documentation

## 2011-09-28 DIAGNOSIS — L298 Other pruritus: Secondary | ICD-10-CM | POA: Insufficient documentation

## 2011-09-28 DIAGNOSIS — IMO0002 Reserved for concepts with insufficient information to code with codable children: Secondary | ICD-10-CM

## 2011-09-28 HISTORY — DX: Systemic lupus erythematosus, unspecified: M32.9

## 2011-09-28 HISTORY — DX: Reserved for concepts with insufficient information to code with codable children: IMO0002

## 2011-09-28 MED ORDER — TRAMADOL HCL 50 MG PO TABS: 50.0000 mg | ORAL_TABLET | Freq: Four times a day (QID) | ORAL | Status: AC | PRN

## 2011-09-28 MED ORDER — PREDNISONE (PAK) 10 MG PO TABS: 10.0000 mg | ORAL_TABLET | Freq: Every day | ORAL | Status: AC

## 2011-09-28 NOTE — ED Notes (Signed)
Reports lupus outbreak x 1 week, states sores outbreak over hands, feets, and thighs. Pt reports she has a doctor and is waiting to have the 90 dollars to go see him. Pt reports she would like some prednisone and tramadol to help till she can see her doctor.

## 2011-09-28 NOTE — ED Provider Notes (Signed)
History     CSN: [id number] Arrival date & time: 09/28/2011 12:09 PM   First MD Initiated Contact with Patient 09/28/11 1525      Chief Complaint  Patient presents with  . Lupus    (Consider location/radiation/quality/duration/timing/severity/associated sxs/prior treatment) Patient is a 40 y.o. female presenting with rash. The history is provided by the patient.  Rash  This is a new problem. The current episode started more than 2 days ago. The problem has been gradually worsening. The rash is present on the back, right arm, left hand, left arm, right hand, right foot and left foot. The pain is at a severity of 8/10. The pain is moderate. The pain has been worsening since onset. Associated symptoms include itching and pain. She has tried nothing for the symptoms.  Pt states she has lupus and experiencing one of flare ups. States pain in all joints in addition to the rash. Denies fever, chills, nausea, vomiting, SOB.   Past Medical History  Diagnosis Date  . Depression   . Chronic pain   . Leukocytoclastic vasculitis   . Tobacco abuse   . Lupus     History reviewed. No pertinent past surgical history.  History reviewed. No pertinent family history.  History  Substance Use Topics  . Smoking status: Current Everyday Smoker    Types: Cigarettes  . Smokeless tobacco: Never Used  . Alcohol Use: No    OB History    Grav Para Term Preterm Abortions TAB SAB Ect Mult Living                  Review of Systems  Constitutional: Positive for activity change and fatigue. Negative for fever and chills.  HENT: Negative.   Eyes: Negative.   Respiratory: Negative.   Cardiovascular: Negative.   Gastrointestinal: Negative.   Genitourinary: Negative.   Musculoskeletal: Positive for joint swelling and arthralgias.  Skin: Positive for itching and rash.  Neurological: Negative.   Psychiatric/Behavioral: Negative.     Allergies  Review of patient's allergies indicates no known  allergies.  Home Medications   Current Outpatient Rx  Name Route Sig Dispense Refill  . ALPRAZOLAM 0.5 MG PO TABS Oral Take 0.5 mg by mouth 2 (two) times daily as needed. nerves     . ESCITALOPRAM OXALATE 20 MG PO TABS Oral Take 20 mg by mouth daily.        BP 115/83  Pulse 91  Temp(Src) 97.2 F (36.2 C) (Oral)  Resp 14  SpO2 97%  Physical Exam  Constitutional: She is oriented to person, place, and time. She appears well-developed and well-nourished. No distress.  HENT:  Head: Normocephalic and atraumatic.  Eyes: Conjunctivae are normal. Pupils are equal, round, and reactive to light.  Neck: Neck supple.  Cardiovascular: Normal rate, regular rhythm and normal heart sounds.   Pulmonary/Chest: Effort normal and breath sounds normal.  Abdominal: Soft.  Musculoskeletal: Normal range of motion.       Tenderness over bilateral ankle and knee joins. No swelling, deformity, erythema noted. Full ROM of all joints  Neurological: She is alert and oriented to person, place, and time.  Skin: Skin is warm and dry.       erythemous scaly patches over hands and feet bilaterally   Psychiatric: She has a normal mood and affect.    ED Course  Procedures (including critical care time)  Pt reports flare up of her lupus. Her appt withPCP is in two weeks. States usually has relief with prednisone  and pain medications. Will d/c home on prednisone and tramadol. With follow up. VS all within normal. Pt non toxic   MDM          Lottie Mussel, PA 09/28/11 1535

## 2011-09-28 NOTE — ED Provider Notes (Signed)
Evaluation and management procedures were performed by the PA/NP under my supervision/collaboration.   Dione Booze, MD 09/28/11 1843

## 2011-12-14 ENCOUNTER — Observation Stay (HOSPITAL_COMMUNITY)
Admission: EM | Admit: 2011-12-14 | Discharge: 2011-12-16 | Disposition: A | Discharge status: Home / Self Care | Payer: Self-pay | Attending: Family Medicine | Admitting: Family Medicine

## 2011-12-14 ENCOUNTER — Encounter (HOSPITAL_COMMUNITY): Payer: Self-pay | Admitting: Emergency Medicine

## 2011-12-14 DIAGNOSIS — M329 Systemic lupus erythematosus, unspecified: Principal | ICD-10-CM | POA: Insufficient documentation

## 2011-12-14 DIAGNOSIS — I776 Arteritis, unspecified: Secondary | ICD-10-CM

## 2011-12-14 DIAGNOSIS — M2550 Pain in unspecified joint: Secondary | ICD-10-CM | POA: Insufficient documentation

## 2011-12-14 DIAGNOSIS — R21 Rash and other nonspecific skin eruption: Secondary | ICD-10-CM

## 2011-12-14 DIAGNOSIS — L738 Other specified follicular disorders: Secondary | ICD-10-CM | POA: Insufficient documentation

## 2011-12-14 DIAGNOSIS — F411 Generalized anxiety disorder: Secondary | ICD-10-CM | POA: Insufficient documentation

## 2011-12-14 DIAGNOSIS — F329 Major depressive disorder, single episode, unspecified: Secondary | ICD-10-CM | POA: Insufficient documentation

## 2011-12-14 DIAGNOSIS — F3289 Other specified depressive episodes: Secondary | ICD-10-CM | POA: Insufficient documentation

## 2011-12-14 DIAGNOSIS — F17200 Nicotine dependence, unspecified, uncomplicated: Secondary | ICD-10-CM | POA: Insufficient documentation

## 2011-12-14 DIAGNOSIS — M1480 Arthropathies in other specified diseases classified elsewhere, unspecified site: Secondary | ICD-10-CM | POA: Insufficient documentation

## 2011-12-14 DIAGNOSIS — G894 Chronic pain syndrome: Secondary | ICD-10-CM | POA: Insufficient documentation

## 2011-12-14 LAB — CBC
Hemoglobin: 13.6 g/dL (ref 12.0–15.0)
MCH: 29.5 pg (ref 26.0–34.0)
MCHC: 34.4 g/dL (ref 30.0–36.0)
Platelets: 275 10*3/uL (ref 150–400)
RDW: 13.6 % (ref 11.5–15.5)

## 2011-12-14 LAB — DIFFERENTIAL
Basophils Absolute: 0.1 10*3/uL (ref 0.0–0.1)
Basophils Relative: 0 % (ref 0–1)
Eosinophils Absolute: 0.3 10*3/uL (ref 0.0–0.7)
Neutro Abs: 8.6 10*3/uL — ABNORMAL HIGH (ref 1.7–7.7)
Neutrophils Relative %: 77 % (ref 43–77)

## 2011-12-14 LAB — POCT PREGNANCY, URINE: Preg Test, Ur: NEGATIVE

## 2011-12-14 MED ORDER — PREDNISONE 20 MG PO TABS
60.0000 mg | ORAL_TABLET | Freq: Once | ORAL | Status: AC
  Administered 2011-12-14: 60 mg via ORAL
  Filled 2011-12-14: qty 3

## 2011-12-14 MED ORDER — HYDROMORPHONE HCL PF 1 MG/ML IJ SOLN
1.0000 mg | Freq: Once | INTRAMUSCULAR | Status: AC
  Administered 2011-12-14: 1 mg via INTRAVENOUS
  Filled 2011-12-14: qty 1

## 2011-12-14 MED ORDER — ONDANSETRON HCL 4 MG/2ML IJ SOLN
4.0000 mg | Freq: Once | INTRAMUSCULAR | Status: AC
  Administered 2011-12-14: 4 mg via INTRAVENOUS
  Filled 2011-12-14: qty 2

## 2011-12-14 NOTE — ED Notes (Signed)
Lupus--flareup joint pain started 1830 tonight--increased stress at home.  Took AM dose Methadone.

## 2011-12-14 NOTE — ED Provider Notes (Signed)
History     CSN: [id number]  Arrival date & time 12/14/11  2205   First MD Initiated Contact with Patient 12/14/11 2327      Chief Complaint  Patient presents with  . Joint Pain    Patient is a 41 y.o. female presenting with hand pain. The history is provided by the patient.  Hand Pain This is a recurrent problem. The current episode started 3 to 5 hours ago. The problem occurs constantly. The problem has been gradually worsening. Pertinent negatives include no chest pain, no abdominal pain, no headaches and no shortness of breath. Exacerbated by: palpation. The symptoms are relieved by nothing.  Pt reports bilateral hand pain with swelling and bilateral foot pain as well She also reports rash to her extremities She reports h/o vasculitis, thought to be related to lupus per patient She reports she has experienced this previously She reports that her hands will "swell" and cause intense burning pain She also reports pain in her feet make it difficult to walk No fever/cp/sob/abd pain/dysuria/vaginal bleeding No vomiting/diarrhea reported  She reports previous association of this pain with crack cocaine but she denies cocaine use recently  Past Medical History  Diagnosis Date  . Depression   . Chronic pain   . Leukocytoclastic vasculitis   . Tobacco abuse   . Lupus     Past Surgical History  Procedure Date  . Cholecystectomy     No family history on file.  History  Substance Use Topics  . Smoking status: Current Everyday Smoker -- 1.0 packs/day    Types: Cigarettes  . Smokeless tobacco: Never Used  . Alcohol Use: No    OB History    Grav Para Term Preterm Abortions TAB SAB Ect Mult Living                  Review of Systems  Respiratory: Negative for shortness of breath.   Cardiovascular: Negative for chest pain.  Gastrointestinal: Negative for abdominal pain.  Neurological: Negative for headaches.  All other systems reviewed and are  negative.    Allergies  Review of patient's allergies indicates no known allergies.  Home Medications   Current Outpatient Rx  Name Route Sig Dispense Refill  . ALPRAZOLAM 0.5 MG PO TABS Oral Take 0.5 mg by mouth 2 (two) times daily as needed. nerves    . ESCITALOPRAM OXALATE 20 MG PO TABS Oral Take 20 mg by mouth daily.      Marland Kitchen METHADONE HCL 10 MG PO TABS Oral Take 120 mg by mouth daily.       BP 136/84  Pulse 102  Temp(Src) 97.7 F (36.5 C) (Oral)  Resp 22  SpO2 100%  Physical Exam CONSTITUTIONAL: Well developed/well nourished, anxious HEAD AND FACE: Normocephalic/atraumatic EYES: EOMI/PERRL ENMT: Mucous membranes dry NECK: supple no meningeal signs SPINE:entire spine nontender CV: S1/S2 noted, no murmurs/rubs/gallops noted LUNGS: Lungs are clear to auscultation bilaterally, no apparent distress ABDOMEN: soft, nontender, no rebound or guarding GU:no cva tenderness NEURO: Pt is awake/alert, moves all extremitiesx4 EXTREMITIES: pulses normal, patient has bilateral hand edema that is tender to palpation She also has some edema to both feet, left >right Due to pain/swelling she is unable to make closed fist with either hand Distal cap refill less than 2 seconds on each hand and no sensory deficit noted SKIN: warm, pt has diffuse rash erythematous plaques to back/buttocks/lower extremities, but no petechiae/purpura noted.  No drainage.  No abscess and no signs of cellulitis PSYCH: anxious  ED Course  Procedures   Labs Reviewed  CBC - Abnormal; Notable for the following:    WBC 11.2 (*)    All other components within normal limits  DIFFERENTIAL - Abnormal; Notable for the following:    Neutro Abs 8.6 (*)    All other components within normal limits  BASIC METABOLIC PANEL  POCT PREGNANCY, URINE  URINALYSIS, ROUTINE W REFLEX MICROSCOPIC  URINE RAPID DRUG SCREEN (HOSP PERFORMED)    11:45 PM Pt reports similar to previous vasculitis flare, reports previous  improvement with pain meds/steroids  1:45 AM Pt still with intractable pain despite  narcotic pain meds Will call for admission for pain control   2:40 AM D/w dr Lovell Sheehan, will admit to [facility]  MDM  Nursing notes reviewed and considered in documentation All labs/vitals reviewed and considered Previous records reviewed and considered        Date: 12/15/2011  Rate: 86  Rhythm: normal sinus rhythm  QRS Axis: normal  Intervals: normal  ST/T Wave abnormalities: nonspecific ST changes  Conduction Disutrbances:none  Narrative Interpretation:   Old EKG Reviewed: unchanged    Joya Gaskins, MD 12/15/11 [phone]

## 2011-12-15 ENCOUNTER — Other Ambulatory Visit: Payer: Self-pay

## 2011-12-15 LAB — BASIC METABOLIC PANEL
BUN: 10 mg/dL (ref 6–23)
Chloride: 102 mEq/L (ref 96–112)
Creatinine, Ser: 0.48 mg/dL — ABNORMAL LOW (ref 0.50–1.10)
GFR calc Af Amer: >90 mL/min (ref >90)
GFR calc Af Amer: >90 mL/min (ref >90)
GFR calc non Af Amer: >90 mL/min (ref >90)
GFR calc non Af Amer: >90 mL/min (ref >90)
Potassium: 5.3 mEq/L — ABNORMAL HIGH (ref 3.5–5.1)
Potassium: 4.7 mEq/L (ref 3.5–5.1)
Sodium: 132 mEq/L — ABNORMAL LOW (ref 135–145)

## 2011-12-15 LAB — RAPID URINE DRUG SCREEN, HOSP PERFORMED
Barbiturates: NOT DETECTED
Benzodiazepines: POSITIVE — AB
Cocaine: NOT DETECTED

## 2011-12-15 LAB — URINALYSIS, ROUTINE W REFLEX MICROSCOPIC
Bilirubin Urine: NEGATIVE
Leukocytes, UA: NEGATIVE
Nitrite: NEGATIVE
Specific Gravity, Urine: 1.034 — ABNORMAL HIGH (ref 1.005–1.030)
Urobilinogen, UA: 0.2 mg/dL (ref 0.0–1.0)
pH: 5.5 (ref 5.0–8.0)

## 2011-12-15 LAB — GLUCOSE, CAPILLARY: Glucose-Capillary: 153 mg/dL — ABNORMAL HIGH (ref 70–99)

## 2011-12-15 LAB — MRSA PCR SCREENING: MRSA by PCR: POSITIVE — AB

## 2011-12-15 LAB — POCT I-STAT, CHEM 8
BUN: 11 mg/dL (ref 6–23)
Calcium, Ion: 1.14 mmol/L (ref 1.12–1.32)
Chloride: 106 mEq/L (ref 96–112)
Glucose, Bld: 114 mg/dL — ABNORMAL HIGH (ref 70–99)
HCT: 39 % (ref 36.0–46.0)
Potassium: 4.1 mEq/L (ref 3.5–5.1)

## 2011-12-15 LAB — CBC
Hemoglobin: 13.6 g/dL (ref 12.0–15.0)
MCHC: 34.3 g/dL (ref 30.0–36.0)
RDW: 13.5 % (ref 11.5–15.5)
WBC: 7.7 10*3/uL (ref 4.0–10.5)

## 2011-12-15 MED ORDER — OXYCODONE HCL 5 MG PO TABS: 5.0000 mg | ORAL_TABLET | ORAL | Status: DC | PRN

## 2011-12-15 MED ORDER — HYDROMORPHONE HCL PF 1 MG/ML IJ SOLN
0.5000 mg | INTRAMUSCULAR | Status: DC | PRN
  Administered 2011-12-15 – 2011-12-16 (×9): 1 mg via INTRAVENOUS
  Filled 2011-12-15 (×9): qty 1

## 2011-12-15 MED ORDER — ACETAMINOPHEN 650 MG RE SUPP: 650.0000 mg | Freq: Four times a day (QID) | RECTAL | Status: DC | PRN

## 2011-12-15 MED ORDER — ZOLPIDEM TARTRATE 5 MG PO TABS: 5.0000 mg | ORAL_TABLET | Freq: Every evening | ORAL | Status: DC | PRN

## 2011-12-15 MED ORDER — SODIUM CHLORIDE 0.9 % IV SOLN
INTRAVENOUS | Status: DC
  Administered 2011-12-15: 100 mL via INTRAVENOUS
  Administered 2011-12-15 – 2011-12-16 (×2): via INTRAVENOUS

## 2011-12-15 MED ORDER — CHLORHEXIDINE GLUCONATE CLOTH 2 % EX PADS
6.0000 | MEDICATED_PAD | Freq: Every day | CUTANEOUS | Status: DC
  Administered 2011-12-16: 6 via TOPICAL

## 2011-12-15 MED ORDER — ESCITALOPRAM OXALATE 20 MG PO TABS
20.0000 mg | ORAL_TABLET | Freq: Every day | ORAL | Status: DC
  Administered 2011-12-15 – 2011-12-16 (×2): 20 mg via ORAL
  Filled 2011-12-15 (×2): qty 1

## 2011-12-15 MED ORDER — ONDANSETRON HCL 4 MG/2ML IJ SOLN: 4.0000 mg | Freq: Four times a day (QID) | INTRAMUSCULAR | Status: DC | PRN

## 2011-12-15 MED ORDER — ACETAMINOPHEN 325 MG PO TABS: 650.0000 mg | ORAL_TABLET | Freq: Four times a day (QID) | ORAL | Status: DC | PRN

## 2011-12-15 MED ORDER — ENOXAPARIN SODIUM 40 MG/0.4ML ~~LOC~~ SOLN
40.0000 mg | Freq: Every day | SUBCUTANEOUS | Status: DC
  Administered 2011-12-15 – 2011-12-16 (×2): 40 mg via SUBCUTANEOUS
  Filled 2011-12-15 (×2): qty 0.4

## 2011-12-15 MED ORDER — HYDROMORPHONE HCL PF 1 MG/ML IJ SOLN
1.0000 mg | INTRAMUSCULAR | Status: DC | PRN
  Administered 2011-12-15 (×4): 1 mg via INTRAVENOUS
  Filled 2011-12-15 (×4): qty 1

## 2011-12-15 MED ORDER — DOXYCYCLINE HYCLATE 100 MG IV SOLR
100.0000 mg | Freq: Two times a day (BID) | INTRAVENOUS | Status: DC
  Administered 2011-12-15 – 2011-12-16 (×3): 100 mg via INTRAVENOUS
  Filled 2011-12-15 (×4): qty 100

## 2011-12-15 MED ORDER — METHADONE HCL 10 MG PO TABS
120.0000 mg | ORAL_TABLET | Freq: Every day | ORAL | Status: DC
  Administered 2011-12-15 – 2011-12-16 (×2): 120 mg via ORAL
  Filled 2011-12-15 (×2): qty 12

## 2011-12-15 MED ORDER — OXYCODONE-ACETAMINOPHEN 5-325 MG PO TABS
2.0000 | ORAL_TABLET | Freq: Once | ORAL | Status: AC
  Administered 2011-12-15: 2 via ORAL
  Filled 2011-12-15: qty 2

## 2011-12-15 MED ORDER — ALUM & MAG HYDROXIDE-SIMETH 200-200-20 MG/5ML PO SUSP: 30.0000 mL | Freq: Four times a day (QID) | ORAL | Status: DC | PRN

## 2011-12-15 MED ORDER — ALPRAZOLAM 0.5 MG PO TABS
0.5000 mg | ORAL_TABLET | Freq: Two times a day (BID) | ORAL | Status: DC | PRN
  Administered 2011-12-16: 0.5 mg via ORAL
  Filled 2011-12-15: qty 1

## 2011-12-15 MED ORDER — PREDNISONE 10 MG PO TABS
60.0000 mg | ORAL_TABLET | Freq: Every day | ORAL | Status: DC
  Administered 2011-12-16: 60 mg via ORAL
  Filled 2011-12-15 (×2): qty 1

## 2011-12-15 MED ORDER — ONDANSETRON HCL 4 MG PO TABS
4.0000 mg | ORAL_TABLET | Freq: Four times a day (QID) | ORAL | Status: DC | PRN
Start: 2011-12-15 — End: 2011-12-16

## 2011-12-15 MED ORDER — MUPIROCIN 2 % EX OINT
1.0000 "application " | TOPICAL_OINTMENT | Freq: Two times a day (BID) | CUTANEOUS | Status: DC
  Administered 2011-12-15 – 2011-12-16 (×2): 1 via NASAL
  Filled 2011-12-15: qty 22

## 2011-12-15 NOTE — ED Notes (Signed)
Report called to floor and patient ready for move.  Staff advised of MRSA.

## 2011-12-15 NOTE — H&P (Addendum)
DATE OF ADMISSION:  12/15/2011  PCP:  Dala Dock    Chief Complaint: Severe Pain in Both Arms and Hands   HPI: [name] is an 41 y.o. female with Systemic Lupus Erythematosus who presents to the Ed with complaints of Severe pain and swelling in her hands and wrist and both of her feet X 2 days.  The pain has been unremitting. She also reports that she has broken out in a painful rash, on her arms and both of her legs and on her buttocks, the rash is recurring for the past 2 years.  She denies fever chills.       Past Medical History  Diagnosis Date  . Depression   . Chronic pain   . Leukocytoclastic vasculitis   . Tobacco abuse   . Lupus     Past Surgical History  Procedure Date  . Cholecystectomy     Medications:  HOME MEDS: Prior to Admission medications   Medication Sig Start Date End Date Taking? Authorizing Provider  ALPRAZolam Prudy Feeler) 0.5 MG tablet Take 0.5 mg by mouth 2 (two) times daily as needed. nerves   Yes Historical Provider, MD  escitalopram (LEXAPRO) 20 MG tablet Take 20 mg by mouth daily.     Yes Historical Provider, MD  methadone (DOLOPHINE) 10 MG tablet Take 120 mg by mouth daily.    Yes Historical Provider, MD    Allergies:  No Known Allergies  Social History: Quit Cocaine 8 months ago.    reports that she has been smoking Cigarettes.  She has been smoking about 1 pack per day. She has never used smokeless tobacco. She reports that she does not drink alcohol or use illicit drugs.  Family History: No family history on file.  Review of Systems:  The patient denies anorexia, fever, weight loss,, vision loss, decreased hearing, hoarseness, chest pain, syncope, dyspnea on exertion, peripheral edema, balance deficits, hemoptysis, abdominal pain, melena, hematochezia, severe indigestion/heartburn, hematuria, incontinence, genital sores, muscle weakness, suspicious skin lesions, transient blindness, difficulty walking, depression, unusual weight change,  abnormal bleeding, enlarged lymph nodes, angioedema, and breast masses.   Physical Exam:  GEN:  Anxious 41 year old obese Caucasian female  examined  and in discomfort but no acute distress; cooperative with exam Filed Vitals:   12/14/11 2221 12/15/11 0027 12/15/11 0314  BP: 136/84 131/77 135/84  Pulse: 102  93  Temp: 97.7 F (36.5 C)  97.9 F (36.6 C)  TempSrc: Oral  Oral  Resp: 22 18 22   SpO2: 100% 96% 98%   Blood pressure 135/84, pulse 93, temperature 97.9 F (36.6 C), temperature source Oral, resp. rate 22, SpO2 98.00%. PSYCH: SHe is alert and oriented x4; does not appear anxious does not appear depressed; affect is normal HEENT: Normocephalic and Atraumatic, Mucous membranes pink; PERRLA; EOM intact; Fundi:  Benign;  No scleral icterus, Nares: Patent, Oropharynx: Clear., Neck:  FROM, no cervical lymphadenopathy nor thyromegaly or carotid bruit; no JVD; Breasts:: Not examined CHEST WALL: No tenderness CHEST: Normal respiration, clear to auscultation bilaterally HEART: Regular rate and rhythm; no murmurs rubs or gallops BACK: No kyphosis or scoliosis; no CVA tenderness ABDOMEN: Positive Bowel Sounds, Scaphoid, Obese, soft non-tender; no masses, no organomegaly, no pannus; no intertriginous candida. Rectal Exam: Not done EXTREMITIES: No bone or joint deformity; age-appropriate arthropathy of the hands and knees; no cyanosis, clubbing or edema; no ulcerations. Genitalia: not examined PULSES: 2+ and symmetric SKIN: Diffuse excoriations and pustular lesions on both forearms, and BLEs, and buttocks area.  CNS: Cranial nerves 2-12 grossly intact no focal neurologic deficit   Labs & Imaging Results for orders placed during the hospital encounter of 12/14/11 (from the past 48 hour(s))  CBC     Status: Abnormal   Collection Time   12/14/11 11:13 PM      Component Value Range Comment   WBC 11.2 (*) 4.0 - 10.5 (K/uL)    RBC 4.61  3.87 - 5.11 (MIL/uL)    Hemoglobin 13.6  12.0 - 15.0  (g/dL)    HCT 04.5  40.9 - 81.1 (%)    MCV 85.7  78.0 - 100.0 (fL)    MCH 29.5  26.0 - 34.0 (pg)    MCHC 34.4  30.0 - 36.0 (g/dL)    RDW 91.4  78.2 - 95.6 (%)    Platelets 275  150 - 400 (K/uL)   DIFFERENTIAL     Status: Abnormal   Collection Time   12/14/11 11:13 PM      Component Value Range Comment   Neutrophils Relative 77  43 - 77 (%)    Neutro Abs 8.6 (*) 1.7 - 7.7 (K/uL)    Lymphocytes Relative 15  12 - 46 (%)    Lymphs Abs 1.7  0.7 - 4.0 (K/uL)    Monocytes Relative 6  3 - 12 (%)    Monocytes Absolute 0.6  0.1 - 1.0 (K/uL)    Eosinophils Relative 2  0 - 5 (%)    Eosinophils Absolute 0.3  0.0 - 0.7 (K/uL)    Basophils Relative 0  0 - 1 (%)    Basophils Absolute 0.1  0.0 - 0.1 (K/uL)   BASIC METABOLIC PANEL     Status: Abnormal   Collection Time   12/14/11 11:13 PM      Component Value Range Comment   Sodium 132 (*) 135 - 145 (mEq/L)    Potassium 5.3 (*) 3.5 - 5.1 (mEq/L)    Chloride 102  96 - 112 (mEq/L)    CO2 19  19 - 32 (mEq/L)    Glucose, Bld 104 (*) 70 - 99 (mg/dL)    BUN 12  6 - 23 (mg/dL)    Creatinine, Ser 2.13  0.50 - 1.10 (mg/dL)    Calcium 9.2  8.4 - 10.5 (mg/dL)    GFR calc non Af Amer >90  >90 (mL/min)    GFR calc Af Amer >90  >90 (mL/min)   URINALYSIS, ROUTINE W REFLEX MICROSCOPIC     Status: Abnormal   Collection Time   12/14/11 11:15 PM      Component Value Range Comment   Color, Urine YELLOW  YELLOW     APPearance CLEAR  CLEAR     Specific Gravity, Urine 1.034 (*) 1.005 - 1.030     pH 5.5  5.0 - 8.0     Glucose, UA NEGATIVE  NEGATIVE (mg/dL)    Hgb urine dipstick NEGATIVE  NEGATIVE     Bilirubin Urine NEGATIVE  NEGATIVE     Ketones, ur NEGATIVE  NEGATIVE (mg/dL)    Protein, ur NEGATIVE  NEGATIVE (mg/dL)    Urobilinogen, UA 0.2  0.0 - 1.0 (mg/dL)    Nitrite NEGATIVE  NEGATIVE     Leukocytes, UA NEGATIVE  NEGATIVE  MICROSCOPIC NOT DONE ON URINES WITH NEGATIVE PROTEIN, BLOOD, LEUKOCYTES, NITRITE, OR GLUCOSE <1000 mg/dL.  URINE RAPID DRUG SCREEN  (HOSP PERFORMED)     Status: Abnormal   Collection Time   12/14/11 11:15 PM  Component Value Range Comment   Opiates NONE DETECTED  NONE DETECTED     Cocaine NONE DETECTED  NONE DETECTED     Benzodiazepines POSITIVE (*) NONE DETECTED     Amphetamines NONE DETECTED  NONE DETECTED     Tetrahydrocannabinol NONE DETECTED  NONE DETECTED     Barbiturates NONE DETECTED  NONE DETECTED    POCT PREGNANCY, URINE     Status: Normal   Collection Time   12/14/11 11:58 PM      Component Value Range Comment   Preg Test, Ur NEGATIVE  NEGATIVE    POCT I-STAT, CHEM 8     Status: Abnormal   Collection Time   12/15/11 12:47 AM      Component Value Range Comment   Sodium 136  135 - 145 (mEq/L)    Potassium 4.1  3.5 - 5.1 (mEq/L)    Chloride 106  96 - 112 (mEq/L)    BUN 11  6 - 23 (mg/dL)    Creatinine, Ser 4.54  0.50 - 1.10 (mg/dL)    Glucose, Bld 098 (*) 70 - 99 (mg/dL)    Calcium, Ion 1.19  1.12 - 1.32 (mmol/L)    TCO2 22  0 - 100 (mmol/L)    Hemoglobin 13.3  12.0 - 15.0 (g/dL)    HCT 14.7  82.9 - 56.2 (%)    No results found.    Assessment:  1.  SLE Lupus Arthritis Flare  2.  SLE 3   Chronic Pain Syndrome 4.  ? MRSA Dermatitis 5.  Hx of Substance Abuse /Narcotic Dependence   Plan: Admitted for Pain control IV Dilaudid prn Steroid taper  MRSA Isolation Nasal swab  For MRSA, Hibiclens Washes Continue methadone treatment DVT Prophylaxis Other plans as per orders.    CODE STATUS:      FULL CODE        Keelia Graybill C 12/15/2011, 3:43 AM

## 2011-12-15 NOTE — Progress Notes (Signed)
Pt gave permission for nurse to use aromatherapy on joints of hands for swelling and pain. applied lavender compresses's made up of witch hazel, 16 oz of sterile water and 6 drops of lavender. Applied 4 x 4 compresses to areas for 45 min. Will remove for 2 hours and reapply.  Peter Congo RN

## 2011-12-15 NOTE — Progress Notes (Signed)
Patient seen, admitted with Lupus flare , for pain control. Will start Prednisone 60 mg po daily, to be tapered over next few weeks. Cont oxycodone prn  for pain control.

## 2011-12-16 MED ORDER — PREDNISONE 20 MG PO TABS: ORAL_TABLET | ORAL | Status: AC

## 2011-12-16 MED ORDER — ALPRAZOLAM 0.5 MG PO TABS: 0.5000 mg | ORAL_TABLET | Freq: Two times a day (BID) | ORAL | Status: DC | PRN

## 2011-12-16 MED ORDER — DOXYCYCLINE HYCLATE 100 MG PO TABS: 100.0000 mg | ORAL_TABLET | Freq: Two times a day (BID) | ORAL | Status: AC

## 2011-12-16 NOTE — Discharge Summary (Signed)
[name], 41 y.o., DOB Aug 09, 1971, MRN [id number]. Admission date: 12/14/2011 Discharge Date 12/16/2011 Primary MD No primary provider on file. Admitting Physician Ron Parker, MD  Admission Diagnosis  Rash [782.1] Vasculitis [447.6] pain all over  Discharge Diagnosis    SLE Rash/Folliculitis Chronic Pain Anxiety  Past Medical History  Diagnosis Date  . Depression   . Chronic pain   . Leukocytoclastic vasculitis   . Tobacco abuse   . Lupus     Past Surgical History  Procedure Date  . Cholecystectomy     Brief History and Physical [name] is an 41 y.o. female with Systemic Lupus Erythematosus who presents to the Ed with complaints of Severe pain and swelling in her hands and wrist and both of her feet X 2 days. The pain has been unremitting. She also reports that she has broken out in a painful rash, on her arms and both of her legs and on her buttocks, the rash is recurring for the past 2 years. She denies fever chills.    Hospital Course  SLE/Lupus Arthritis Flare Patient was started on prednisone taper, and was continued on methadone which she takes from the methadone clinic. At this time patient's arthritis has much improved she'll be sent home on prednisone taper for over the next 3 weeks , she has an appointment to see [facility]  physician on 17th of February. And then she should get outpatient appointment with rheumatology.   Rash/folliculitis Patient was started on Doxycycline 100 mg po bid in the hospital and her rash has improved. We'll send the patient home on doxycycline  100 mg by mouth twice a day for 10 more days   Anxiety   patient takes Xanax 0.5 by mouth twice a day at home will send the patient home on Xanax 0.5 twice a day when necessary for anxiety      Today   Subjective:   [name] today has no headache,no chest abdominal pain,no new weakness. Rash is much improved and no pain at this swelling of the hands also is  improved  Objective:   Blood pressure 132/89, pulse 85, temperature 98.4 F (36.9 C), temperature source Oral, resp. rate 18, height 5\' 3"  (1.6 m), weight 87.6 kg (193 lb 2 oz), SpO2 97.00%.  Intake/Output Summary (Last 24 hours) at 12/16/11 1249 Last data filed at 12/16/11 0930  Gross per 24 hour  Intake 3258.33 ml  Output      0 ml  Net 3258.33 ml    Exam Awake Alert, Oriented *3, No new F.N deficits, Normal affect [place].AT,PERRAL Supple Neck,No JVD, No cervical lymphadenopathy appriciated.  Symmetrical Chest wall movement, Good air movement bilaterally, CTAB RRR,No Gallops,Rubs or new Murmurs, No Parasternal Heave +ve B.Sounds, Abd Soft, Non tender, No organomegaly appriciated, No rebound -guarding or rigidity. No Cyanosis, Clubbing or edema, scabs noted, no open areas at this time noted on the skin   Data Review     CBC w Diff: Lab Results  Component Value Date   WBC 7.7 12/15/2011   HGB 13.6 12/15/2011   HCT 39.7 12/15/2011   PLT 292 12/15/2011   LYMPHOPCT 15 12/14/2011   MONOPCT 6 12/14/2011   EOSPCT 2 12/14/2011   BASOPCT 0 12/14/2011   CMP: Lab Results  Component Value Date   NA 137 12/15/2011   K 4.7 12/15/2011   CL 103 12/15/2011   CO2 24 12/15/2011   BUN 10 12/15/2011   CREATININE 0.48* 12/15/2011  PROT 6.2 04/05/2011   ALBUMIN 2.8* 04/05/2011   BILITOT 0.5 04/05/2011   ALKPHOS 77 04/05/2011   AST 15 04/05/2011   ALT 13 04/05/2011  .  Micro Results Recent Results (from the past 240 hour(s))  MRSA PCR SCREENING     Status: Abnormal   Collection Time   12/15/11  7:44 AM      Component Value Range Status Comment   MRSA by PCR POSITIVE (*) NEGATIVE  Final      Discharge Instructions      Follow-up Information    Follow up with [facility] in 3 weeks.         Discharge Medications   Medication List  As of 12/16/2011 12:49 PM   START taking these medications         doxycycline 100 MG tablet   Commonly known as: VIBRA-TABS   Take 1 tablet (100  mg total) by mouth 2 (two) times daily.      predniSONE 20 MG tablet   Commonly known as: DELTASONE   Prednisone 40 mg po daily x 5 days then  Prednisone 30 mg po daily x 5 days then   Prednisone 20 mg po daily x 5 days then  Prednisone 10 mg po daily x 5 days then         CHANGE how you take these medications         ALPRAZolam 0.5 MG tablet   Commonly known as: XANAX   Take 1 tablet (0.5 mg total) by mouth 2 (two) times daily as needed for anxiety. nerves   What changed: - dose - reasons to take the med         CONTINUE taking these medications         escitalopram 20 MG tablet   Commonly known as: LEXAPRO      methadone 10 MG tablet   Commonly known as: DOLOPHINE          Where to get your medications    These are the prescriptions that you need to pick up.   You may get these medications from any pharmacy.         ALPRAZolam 0.5 MG tablet   doxycycline 100 MG tablet   predniSONE 20 MG tablet             Total Time in preparing paper work, data evaluation and todays exam - 35 minutes  Tereso Unangst S M.D on 12/16/2011 at 12:49 PM  [facility] Office  [phone]

## 2011-12-16 NOTE — Progress Notes (Signed)
Pt was given AVS discharge instructions, follow up appointments and new prescriptions. IV D/C, unremarkable. Gave ptt education on lupus and all questions answered. Ambulated out of hospital for home accompanied by boyfriend. Peter Congo RN

## 2012-03-07 ENCOUNTER — Inpatient Hospital Stay (HOSPITAL_COMMUNITY)
Admission: EM | Admit: 2012-03-07 | Discharge: 2012-03-09 | DRG: 546 | Disposition: A | Discharge status: Home / Self Care | Payer: Self-pay | Attending: Internal Medicine | Admitting: Internal Medicine

## 2012-03-07 ENCOUNTER — Encounter (HOSPITAL_COMMUNITY): Payer: Self-pay | Admitting: Emergency Medicine

## 2012-03-07 DIAGNOSIS — F329 Major depressive disorder, single episode, unspecified: Secondary | ICD-10-CM | POA: Diagnosis present

## 2012-03-07 DIAGNOSIS — F1210 Cannabis abuse, uncomplicated: Secondary | ICD-10-CM | POA: Diagnosis present

## 2012-03-07 DIAGNOSIS — N390 Urinary tract infection, site not specified: Secondary | ICD-10-CM | POA: Diagnosis present

## 2012-03-07 DIAGNOSIS — F1120 Opioid dependence, uncomplicated: Secondary | ICD-10-CM | POA: Diagnosis present

## 2012-03-07 DIAGNOSIS — M1480 Arthropathies in other specified diseases classified elsewhere, unspecified site: Secondary | ICD-10-CM | POA: Diagnosis present

## 2012-03-07 DIAGNOSIS — F3289 Other specified depressive episodes: Secondary | ICD-10-CM | POA: Diagnosis present

## 2012-03-07 DIAGNOSIS — D72829 Elevated white blood cell count, unspecified: Secondary | ICD-10-CM

## 2012-03-07 DIAGNOSIS — E669 Obesity, unspecified: Secondary | ICD-10-CM | POA: Diagnosis present

## 2012-03-07 DIAGNOSIS — M329 Systemic lupus erythematosus, unspecified: Principal | ICD-10-CM | POA: Diagnosis present

## 2012-03-07 DIAGNOSIS — F17200 Nicotine dependence, unspecified, uncomplicated: Secondary | ICD-10-CM | POA: Diagnosis present

## 2012-03-07 DIAGNOSIS — M310 Hypersensitivity angiitis: Secondary | ICD-10-CM | POA: Diagnosis present

## 2012-03-07 DIAGNOSIS — G8929 Other chronic pain: Secondary | ICD-10-CM | POA: Diagnosis present

## 2012-03-07 LAB — URINALYSIS, ROUTINE W REFLEX MICROSCOPIC
Bilirubin Urine: NEGATIVE
Ketones, ur: NEGATIVE mg/dL
Nitrite: NEGATIVE
Protein, ur: NEGATIVE mg/dL
Urobilinogen, UA: 1 mg/dL (ref 0.0–1.0)

## 2012-03-07 LAB — DIFFERENTIAL
Basophils Relative: 0 % (ref 0–1)
Eosinophils Absolute: 0.2 10*3/uL (ref 0.0–0.7)
Monocytes Absolute: 0.6 10*3/uL (ref 0.1–1.0)
Monocytes Relative: 4 % (ref 3–12)

## 2012-03-07 LAB — CBC
HCT: 40.6 % (ref 36.0–46.0)
Hemoglobin: 14.1 g/dL (ref 12.0–15.0)
MCH: 29.5 pg (ref 26.0–34.0)
MCHC: 34.7 g/dL (ref 30.0–36.0)
RDW: 13.6 % (ref 11.5–15.5)

## 2012-03-07 LAB — BASIC METABOLIC PANEL
BUN: 9 mg/dL (ref 6–23)
Calcium: 9.2 mg/dL (ref 8.4–10.5)
Creatinine, Ser: 0.66 mg/dL (ref 0.50–1.10)
GFR calc Af Amer: >90 mL/min (ref >90)
GFR calc non Af Amer: >90 mL/min (ref >90)

## 2012-03-07 LAB — URINE MICROSCOPIC-ADD ON

## 2012-03-07 LAB — RAPID URINE DRUG SCREEN, HOSP PERFORMED
Barbiturates: NOT DETECTED
Benzodiazepines: POSITIVE — AB

## 2012-03-07 MED ORDER — DEXTROSE 5 % IV SOLN
1.0000 g | Freq: Once | INTRAVENOUS | Status: AC
  Administered 2012-03-07: 1 g via INTRAVENOUS
  Filled 2012-03-07: qty 10

## 2012-03-07 MED ORDER — PREDNISONE 20 MG PO TABS: 60.0000 mg | ORAL_TABLET | Freq: Once | ORAL | Status: DC

## 2012-03-07 MED ORDER — SODIUM CHLORIDE 0.9 % IV SOLN
Freq: Once | INTRAVENOUS | Status: AC
  Administered 2012-03-07: 22:00:00 via INTRAVENOUS

## 2012-03-07 MED ORDER — PREDNISONE 20 MG PO TABS
60.0000 mg | ORAL_TABLET | Freq: Once | ORAL | Status: AC
  Administered 2012-03-07: 60 mg via ORAL
  Filled 2012-03-07: qty 3

## 2012-03-07 MED ORDER — SODIUM CHLORIDE 0.9 % IV BOLUS (SEPSIS)
1000.0000 mL | Freq: Once | INTRAVENOUS | Status: AC
  Administered 2012-03-07: 1000 mL via INTRAVENOUS

## 2012-03-07 MED ORDER — HYDROMORPHONE HCL PF 2 MG/ML IJ SOLN
2.0000 mg | Freq: Once | INTRAMUSCULAR | Status: AC
  Administered 2012-03-07: 2 mg via INTRAMUSCULAR
  Filled 2012-03-07: qty 1

## 2012-03-07 MED ORDER — ALPRAZOLAM 0.5 MG PO TABS
1.0000 mg | ORAL_TABLET | Freq: Once | ORAL | Status: AC
  Administered 2012-03-07: 1 mg via ORAL
  Filled 2012-03-07: qty 2

## 2012-03-07 MED ORDER — HYDROMORPHONE HCL PF 1 MG/ML IJ SOLN
1.0000 mg | Freq: Once | INTRAMUSCULAR | Status: AC
  Administered 2012-03-07: 1 mg via INTRAVENOUS
  Filled 2012-03-07: qty 1

## 2012-03-07 NOTE — ED Provider Notes (Signed)
History     CSN: [id number]  Arrival date & time 03/07/12  1833   None     Chief Complaint  Patient presents with  . Lupus    (Consider location/radiation/quality/duration/timing/severity/associated sxs/prior treatment) Patient is a 41 y.o. female presenting with extremity pain. The history is provided by the patient.  Extremity Pain This is a recurrent problem. The current episode started today. The problem occurs constantly. The problem has been gradually worsening. Associated symptoms include arthralgias and a rash. Pertinent negatives include no abdominal pain, chills, fever, nausea or vomiting. Associated symptoms comments: Recurrent diffuse joint pain associated with history of lupus. She is managed with methadone but missed her appointment for refills today and exacerbated her pain..    Past Medical History  Diagnosis Date  . Depression   . Chronic pain   . Leukocytoclastic vasculitis   . Tobacco abuse   . Lupus     Past Surgical History  Procedure Date  . Cholecystectomy     No family history on file.  History  Substance Use Topics  . Smoking status: Current Everyday Smoker -- 1.0 packs/day    Types: Cigarettes  . Smokeless tobacco: Never Used  . Alcohol Use: No    OB History    Grav Para Term Preterm Abortions TAB SAB Ect Mult Living                  Review of Systems  Constitutional: Negative for fever and chills.  Respiratory: Negative.   Cardiovascular: Negative.   Gastrointestinal: Negative.  Negative for nausea, vomiting and abdominal pain.  Musculoskeletal: Positive for arthralgias.  Skin: Positive for rash.    Allergies  Review of patient's allergies indicates no known allergies.  Home Medications   Current Outpatient Rx  Name Route Sig Dispense Refill  . ALPRAZOLAM 0.5 MG PO TABS Oral Take 1 tablet (0.5 mg total) by mouth 2 (two) times daily as needed for anxiety. nerves 10 tablet 0  . METHACHOLINE CHLORIDE 100 MG IN SOLR Inhalation  Inhale 120 mg into the lungs daily.      BP 142/91  Pulse 107  Temp(Src) 97.9 F (36.6 C) (Oral)  Resp 22  SpO2 100%  LMP 02/18/2012  Physical Exam  Constitutional: She appears well-developed and well-nourished.       Uncomfortable in appearance.  HENT:  Head: Normocephalic.  Neck: Normal range of motion. Neck supple.  Cardiovascular: Normal rate and regular rhythm.   Pulmonary/Chest: Effort normal and breath sounds normal.  Abdominal: Soft. Bowel sounds are normal. There is no tenderness. There is no rebound and no guarding.  Musculoskeletal: Normal range of motion.  Neurological: She is alert. No cranial nerve deficit.  Skin: Skin is warm and dry. No rash noted.       Diffuse scabbed small lesions to lower extremities bilaterally. No redness, no swelling.  Psychiatric: She has a normal mood and affect.    ED Course  Procedures (including critical care time)  Labs Reviewed - No data to display No results found.   No diagnosis found.    MDM  Her pain is not easily managed with medications and is now having swelling to bilateral wrists and complaint of increasing pain in her feet. Will move her to CDU with pending labs, IV started for pain medication administration, and will observe for pain control and possible admission for lupus exacerbation.         Rodena Medin, PA-C 03/07/12 2143

## 2012-03-07 NOTE — ED Notes (Signed)
Pt c/o pain to bottom of feet, bilateral knees, and wrist.  States she has Lupus and was unable to go to Methadone clinic today.

## 2012-03-07 NOTE — ED Provider Notes (Signed)
9:46 PM  report received Shelby Beam PA. Patient wanted to CDU awaiting lab results. Patient will also be treated for her lupus pain with IV meds and fluids. Will dispo after results.  11:30.  Treated with Rocephen 1gm IV for UTI. Continues to have pain in her wrist,  Knees and feet after 2 doses of dilaudid IV.   12 midnight  Patient will be admitted per Hospitalist team 6 for lupus arthritis flare and UTI  Remi Haggard, NP 03/08/12 0002

## 2012-03-07 NOTE — ED Notes (Signed)
Pt in triage 1 yelling that she needs to use the bathroom and needs a bedpan.  Informed pt that I would get her a wheelchair to go to bathroom.  Brought wheelchair to pt and assisted her to restroom.  I informed pt that I was going to sit her purse in the bathroom with her so that it was not left in wheelchair outside of restroom.  Pt began yelling and cursing at RN.  Pt states, " I don't care what you do with it.  You can shove it up your a**."

## 2012-03-08 ENCOUNTER — Encounter (HOSPITAL_COMMUNITY): Payer: Self-pay | Admitting: *Deleted

## 2012-03-08 DIAGNOSIS — M329 Systemic lupus erythematosus, unspecified: Secondary | ICD-10-CM | POA: Diagnosis present

## 2012-03-08 DIAGNOSIS — D72829 Elevated white blood cell count, unspecified: Secondary | ICD-10-CM | POA: Diagnosis present

## 2012-03-08 DIAGNOSIS — M310 Hypersensitivity angiitis: Secondary | ICD-10-CM | POA: Diagnosis present

## 2012-03-08 DIAGNOSIS — G8929 Other chronic pain: Secondary | ICD-10-CM | POA: Diagnosis present

## 2012-03-08 DIAGNOSIS — N390 Urinary tract infection, site not specified: Secondary | ICD-10-CM | POA: Diagnosis present

## 2012-03-08 LAB — URINE CULTURE

## 2012-03-08 LAB — CBC
Hemoglobin: 13.1 g/dL (ref 12.0–15.0)
MCH: 29 pg (ref 26.0–34.0)
MCHC: 33.2 g/dL (ref 30.0–36.0)
MCV: 87.4 fL (ref 78.0–100.0)
RBC: 4.51 MIL/uL (ref 3.87–5.11)

## 2012-03-08 LAB — BASIC METABOLIC PANEL
BUN: 9 mg/dL (ref 6–23)
CO2: 25 mEq/L (ref 19–32)
Calcium: 9 mg/dL (ref 8.4–10.5)
Glucose, Bld: 133 mg/dL — ABNORMAL HIGH (ref 70–99)
Potassium: 4.1 mEq/L (ref 3.5–5.1)
Sodium: 135 mEq/L (ref 135–145)

## 2012-03-08 MED ORDER — ALUM & MAG HYDROXIDE-SIMETH 200-200-20 MG/5ML PO SUSP: 30.0000 mL | Freq: Four times a day (QID) | ORAL | Status: DC | PRN

## 2012-03-08 MED ORDER — PREDNISONE (PAK) 10 MG PO TABS
10.0000 mg | ORAL_TABLET | ORAL | Status: AC
  Administered 2012-03-08: 10 mg via ORAL
  Filled 2012-03-08: qty 21

## 2012-03-08 MED ORDER — ACETAMINOPHEN 650 MG RE SUPP: 650.0000 mg | Freq: Four times a day (QID) | RECTAL | Status: DC | PRN

## 2012-03-08 MED ORDER — PREDNISONE (PAK) 10 MG PO TABS
20.0000 mg | ORAL_TABLET | Freq: Every evening | ORAL | Status: AC
  Administered 2012-03-08: 20 mg via ORAL

## 2012-03-08 MED ORDER — ENOXAPARIN SODIUM 40 MG/0.4ML ~~LOC~~ SOLN
40.0000 mg | Freq: Every day | SUBCUTANEOUS | Status: DC
  Administered 2012-03-08 – 2012-03-09 (×2): 40 mg via SUBCUTANEOUS
  Filled 2012-03-08 (×2): qty 0.4

## 2012-03-08 MED ORDER — ONDANSETRON HCL 4 MG/2ML IJ SOLN: 4.0000 mg | Freq: Four times a day (QID) | INTRAMUSCULAR | Status: DC | PRN

## 2012-03-08 MED ORDER — PREDNISONE (PAK) 10 MG PO TABS: 10.0000 mg | ORAL_TABLET | Freq: Four times a day (QID) | ORAL | Status: DC

## 2012-03-08 MED ORDER — ESCITALOPRAM OXALATE 20 MG PO TABS
20.0000 mg | ORAL_TABLET | Freq: Every day | ORAL | Status: DC
  Administered 2012-03-08 – 2012-03-09 (×2): 20 mg via ORAL
  Filled 2012-03-08 (×2): qty 1

## 2012-03-08 MED ORDER — METHACHOLINE CHLORIDE 100 MG IN SOLR
120.0000 mg | Freq: Every day | RESPIRATORY_TRACT | Status: DC
Start: 2012-03-08 — End: 2012-03-08

## 2012-03-08 MED ORDER — PREDNISONE (PAK) 10 MG PO TABS
20.0000 mg | ORAL_TABLET | Freq: Every morning | ORAL | Status: AC
  Administered 2012-03-08: 20 mg via ORAL
  Filled 2012-03-08: qty 21

## 2012-03-08 MED ORDER — ONDANSETRON HCL 4 MG PO TABS: 4.0000 mg | ORAL_TABLET | Freq: Four times a day (QID) | ORAL | Status: DC | PRN

## 2012-03-08 MED ORDER — HYDROMORPHONE HCL PF 1 MG/ML IJ SOLN
0.5000 mg | INTRAMUSCULAR | Status: DC | PRN
  Administered 2012-03-08 (×2): 1 mg via INTRAVENOUS
  Filled 2012-03-08 (×2): qty 1

## 2012-03-08 MED ORDER — ZOLPIDEM TARTRATE 5 MG PO TABS: 5.0000 mg | ORAL_TABLET | Freq: Every evening | ORAL | Status: DC | PRN

## 2012-03-08 MED ORDER — OXYCODONE HCL 5 MG PO TABS
5.0000 mg | ORAL_TABLET | ORAL | Status: DC | PRN
  Administered 2012-03-08 – 2012-03-09 (×7): 5 mg via ORAL
  Filled 2012-03-08 (×9): qty 1

## 2012-03-08 MED ORDER — ALPRAZOLAM 0.5 MG PO TABS
0.5000 mg | ORAL_TABLET | Freq: Two times a day (BID) | ORAL | Status: DC | PRN
  Administered 2012-03-08 – 2012-03-09 (×3): 0.5 mg via ORAL
  Filled 2012-03-08 (×3): qty 1

## 2012-03-08 MED ORDER — ACETAMINOPHEN 325 MG PO TABS
650.0000 mg | ORAL_TABLET | Freq: Four times a day (QID) | ORAL | Status: DC | PRN
  Filled 2012-03-08: qty 2

## 2012-03-08 MED ORDER — DEXTROSE 5 % IV SOLN
1.0000 g | INTRAVENOUS | Status: DC
  Administered 2012-03-08: 1 g via INTRAVENOUS
  Filled 2012-03-08 (×2): qty 10

## 2012-03-08 MED ORDER — SODIUM CHLORIDE 0.9 % IV SOLN
INTRAVENOUS | Status: DC
  Administered 2012-03-08: 02:00:00 via INTRAVENOUS

## 2012-03-08 MED ORDER — PREDNISONE (PAK) 10 MG PO TABS
10.0000 mg | ORAL_TABLET | Freq: Three times a day (TID) | ORAL | Status: DC
  Administered 2012-03-09 (×2): 10 mg via ORAL

## 2012-03-08 MED ORDER — METHYLPREDNISOLONE SODIUM SUCC 125 MG IJ SOLR: 125.0000 mg | Freq: Once | INTRAMUSCULAR | Status: DC

## 2012-03-08 MED ORDER — METHADONE HCL 10 MG PO TABS
120.0000 mg | ORAL_TABLET | Freq: Every day | ORAL | Status: DC
  Administered 2012-03-08 – 2012-03-09 (×2): 120 mg via ORAL
  Filled 2012-03-08 (×2): qty 12

## 2012-03-08 MED ORDER — SODIUM CHLORIDE 0.9 % IV SOLN: INTRAVENOUS | Status: AC

## 2012-03-08 MED ORDER — PREDNISONE (PAK) 10 MG PO TABS: 20.0000 mg | ORAL_TABLET | Freq: Every evening | ORAL | Status: DC

## 2012-03-08 MED ORDER — PREDNISONE (PAK) 10 MG PO TABS
10.0000 mg | ORAL_TABLET | ORAL | Status: AC
  Administered 2012-03-08: 10 mg via ORAL

## 2012-03-08 NOTE — ED Provider Notes (Signed)
Medical screening examination/treatment/procedure(s) were performed by non-physician practitioner and as supervising physician I was immediately available for consultation/collaboration.   Ulysess Witz A Oleva Koo, MD 03/08/12 0023 

## 2012-03-08 NOTE — Progress Notes (Signed)
I have seen and assessed patient and agree with Dr Jenkin's assessment and plan. 

## 2012-03-08 NOTE — ED Provider Notes (Signed)
Medical screening examination/treatment/procedure(s) were performed by non-physician practitioner and as supervising physician I was immediately available for consultation/collaboration.   Darlene Brozowski A Willis Kuipers, MD 03/08/12 0023 

## 2012-03-08 NOTE — H&P (Addendum)
DATE OF ADMISSION:  03/08/2012  PCP:   No primary provider on file.   Chief Complaint: Increased pain and skin eruptions    HPI: [name] is an 41 y.o. female who presents to the ED with complaints of eruptions of painful ulcers on BLEs and increase joint swelling and pain over the past day.  She reports fevers but no chills.  She has a history of SLE, Leukocytoclastic Vasculitis and has had multiple similar flare ups in the past that resulted in hospitalization.      Past Medical History  Diagnosis Date  . Depression   . Chronic pain   . Leukocytoclastic vasculitis   . Tobacco abuse   . Lupus     Past Surgical History  Procedure Date  . Cholecystectomy     Medications:  HOME MEDS: Prior to Admission medications   Medication Sig Start Date End Date Taking? Authorizing Provider  ALPRAZolam Prudy Feeler) 0.5 MG tablet Take 1 tablet (0.5 mg total) by mouth 2 (two) times daily as needed for anxiety. nerves 12/16/11  Yes Meredeth Ide, MD  methacholine (PROVOCHOLINE) 100 MG inhaler solution Inhale 120 mg into the lungs daily.   Yes Historical Provider, MD    Allergies:  No Known Allergies  Social History:   reports that she has been smoking Cigarettes.  She has been smoking about 1 pack per day. She has never used smokeless tobacco. She reports that she does not drink alcohol or use illicit drugs.  Family History: No family history on file.  Review of Systems: Positive for :  suspicious skin lesions on BLEs The patient denies anorexia, fever, weight loss, vision loss, decreased hearing, hoarseness, chest pain, syncope, dyspnea on exertion, peripheral edema, balance deficits, hemoptysis, abdominal pain, melena, hematochezia, severe indigestion/heartburn, hematuria, incontinence, genital sores, muscle weakness,, transient blindness, difficulty walking, depression, unusual weight change, abnormal bleeding, enlarged lymph nodes, angioedema, and breast masses.   Physical Exam:  GEN:   Agitated Obese 40 y/o Caucasian female examined  and in no acute distress Filed Vitals:   03/07/12 1838 03/07/12 2132 03/07/12 2334  BP: 142/91 148/103 137/84  Pulse: 107 92 80  Temp: 97.9 F (36.6 C)  97.6 F (36.4 C)  TempSrc: Oral  Oral  Resp: 22 24 18   SpO2: 100% 98% 100%   Blood pressure 137/84, pulse 80, temperature 97.6 F (36.4 C), temperature source Oral, resp. rate 18, last menstrual period 02/18/2012, SpO2 100.00%. PSYCH: She is alert and oriented x4; does not appear anxious does not appear depressed; affect is normal HEENT: Normocephalic and Atraumatic, Mucous membranes pink; PERRLA; EOM intact; Fundi:  Benign;  No scleral icterus, Nares: Patent, Oropharynx: Clear, Poor Dentition, Neck:  FROM, no cervical lymphadenopathy nor thyromegaly or carotid bruit; no JVD; Breasts:: Not examined CHEST WALL: No tenderness CHEST: Normal respiration, clear to auscultation bilaterally HEART: Regular rate and rhythm; no murmurs rubs or gallops BACK: No kyphosis or scoliosis; no CVA tenderness ABDOMEN: Positive Bowel Sounds, Obese, soft non-tender; no masses, no organomegaly.   Rectal Exam: Not done EXTREMITIES: No bone or joint deformity; age-appropriate arthropathy of the hands and knees; no cyanosis, clubbing or edema. Genitalia: not examined PULSES: 2+ and symmetric SKIN: Normal hydration, multiple small discrete ulcers on BLEs.   CNS: Cranial nerves 2-12 grossly intact no focal neurologic deficit   Labs & Imaging Results for orders placed during the hospital encounter of 03/07/12 (from the past 48 hour(s))  CBC     Status: Abnormal   Collection  Time   03/07/12  9:37 PM      Component Value Range Comment   WBC 13.5 (*) 4.0 - 10.5 (K/uL)    RBC 4.78  3.87 - 5.11 (MIL/uL)    Hemoglobin 14.1  12.0 - 15.0 (g/dL)    HCT 16.1  09.6 - 04.5 (%)    MCV 84.9  78.0 - 100.0 (fL)    MCH 29.5  26.0 - 34.0 (pg)    MCHC 34.7  30.0 - 36.0 (g/dL)    RDW 40.9  81.1 - 91.4 (%)    Platelets 320   150 - 400 (K/uL)   DIFFERENTIAL     Status: Abnormal   Collection Time   03/07/12  9:37 PM      Component Value Range Comment   Neutrophils Relative 66  43 - 77 (%)    Neutro Abs 8.9 (*) 1.7 - 7.7 (K/uL)    Lymphocytes Relative 29  12 - 46 (%)    Lymphs Abs 3.9  0.7 - 4.0 (K/uL)    Monocytes Relative 4  3 - 12 (%)    Monocytes Absolute 0.6  0.1 - 1.0 (K/uL)    Eosinophils Relative 1  0 - 5 (%)    Eosinophils Absolute 0.2  0.0 - 0.7 (K/uL)    Basophils Relative 0  0 - 1 (%)    Basophils Absolute 0.1  0.0 - 0.1 (K/uL)   BASIC METABOLIC PANEL     Status: Abnormal   Collection Time   03/07/12  9:37 PM      Component Value Range Comment   Sodium 134 (*) 135 - 145 (mEq/L)    Potassium 3.9  3.5 - 5.1 (mEq/L)    Chloride 102  96 - 112 (mEq/L)    CO2 19  19 - 32 (mEq/L)    Glucose, Bld 98  70 - 99 (mg/dL)    BUN 9  6 - 23 (mg/dL)    Creatinine, Ser 7.82  0.50 - 1.10 (mg/dL)    Calcium 9.2  8.4 - 10.5 (mg/dL)    GFR calc non Af Amer >90  >90 (mL/min)    GFR calc Af Amer >90  >90 (mL/min)   URINALYSIS, ROUTINE W REFLEX MICROSCOPIC     Status: Abnormal   Collection Time   03/07/12  9:38 PM      Component Value Range Comment   Color, Urine YELLOW  YELLOW     APPearance CLOUDY (*) CLEAR     Specific Gravity, Urine 1.030  1.005 - 1.030     pH 6.5  5.0 - 8.0     Glucose, UA NEGATIVE  NEGATIVE (mg/dL)    Hgb urine dipstick NEGATIVE  NEGATIVE     Bilirubin Urine NEGATIVE  NEGATIVE     Ketones, ur NEGATIVE  NEGATIVE (mg/dL)    Protein, ur NEGATIVE  NEGATIVE (mg/dL)    Urobilinogen, UA 1.0  0.0 - 1.0 (mg/dL)    Nitrite NEGATIVE  NEGATIVE     Leukocytes, UA MODERATE (*) NEGATIVE    URINE RAPID DRUG SCREEN (HOSP PERFORMED)     Status: Abnormal   Collection Time   03/07/12  9:38 PM      Component Value Range Comment   Opiates NONE DETECTED  NONE DETECTED     Cocaine NONE DETECTED  NONE DETECTED     Benzodiazepines POSITIVE (*) NONE DETECTED     Amphetamines NONE DETECTED  NONE DETECTED      Tetrahydrocannabinol POSITIVE (*) NONE  DETECTED     Barbiturates NONE DETECTED  NONE DETECTED    URINE MICROSCOPIC-ADD ON     Status: Abnormal   Collection Time   03/07/12  9:38 PM      Component Value Range Comment   Squamous Epithelial / LPF FEW (*) RARE     WBC, UA 21-50  <3 (WBC/hpf)    Bacteria, UA RARE  RARE     No results found.    Assessment: Present on Admission:  .Leukocytoclastic vasculitis .UTI (lower urinary tract infection) .Leukocytosis .Chronic pain Opioid dependence Tobacco Use Disorder Marijuana Abuse Obesity   Plan:    Admiit to Med Surg Bed IV Steroid Taper IVFS Pain Control DVT prophylaxis Other plans as per orders.    CODE STATUS:      FULL CODE        Aerionna Moravek C 03/08/2012, 12:57 AM

## 2012-03-09 LAB — CBC
HCT: 38.7 % (ref 36.0–46.0)
MCH: 29.2 pg (ref 26.0–34.0)
MCHC: 33.9 g/dL (ref 30.0–36.0)
MCV: 86.4 fL (ref 78.0–100.0)
Platelets: 287 10*3/uL (ref 150–400)
RDW: 13.7 % (ref 11.5–15.5)

## 2012-03-09 LAB — DIFFERENTIAL
Basophils Absolute: 0 10*3/uL (ref 0.0–0.1)
Basophils Relative: 0 % (ref 0–1)
Eosinophils Absolute: 0 10*3/uL (ref 0.0–0.7)
Eosinophils Relative: 0 % (ref 0–5)
Lymphocytes Relative: 11 % — ABNORMAL LOW (ref 12–46)
Monocytes Absolute: 0.8 10*3/uL (ref 0.1–1.0)

## 2012-03-09 LAB — BASIC METABOLIC PANEL
BUN: 11 mg/dL (ref 6–23)
CO2: 22 mEq/L (ref 19–32)
Calcium: 9.3 mg/dL (ref 8.4–10.5)
Chloride: 102 mEq/L (ref 96–112)
Creatinine, Ser: 0.56 mg/dL (ref 0.50–1.10)
Glucose, Bld: 130 mg/dL — ABNORMAL HIGH (ref 70–99)

## 2012-03-09 MED ORDER — CLONAZEPAM 0.5 MG PO TABS: 0.5000 mg | ORAL_TABLET | Freq: Two times a day (BID) | ORAL | Status: DC

## 2012-03-09 MED ORDER — PREDNISONE 10 MG PO TABS: 10.0000 mg | ORAL_TABLET | Freq: Every day | ORAL | Status: DC

## 2012-03-09 MED ORDER — METHADONE HCL 10 MG PO TABS: 120.0000 mg | ORAL_TABLET | Freq: Every day | ORAL | Status: DC

## 2012-03-09 NOTE — Progress Notes (Signed)
D/C instructions reviewed with patient and boyfriend. RX x 2 given. No hh services or equipment needed. All questions answered. Pt d/c'ed via ambulation in stable condition

## 2012-03-09 NOTE — Discharge Summary (Signed)
Patient ID: [name] MRN: [id number] DOB/AGE: 08/07/1971 41 y.o. Primary Care Physician:VOLLMER, Tresa Endo, MD, MD Admit date: 03/07/2012 Discharge date: 03/09/2012    Discharge Diagnoses:   Principal Problem:  *Leukocytoclastic vasculitis Active Problems:  UTI (lower urinary tract infection)  Leukocytosis  Chronic pain  SLE exacerbation   Medication List  As of 03/15/2012 10:18 PM   START taking these medications         clonazePAM 0.5 MG tablet   Commonly known as: KLONOPIN   Take 1 tablet (0.5 mg total) by mouth 2 (two) times daily.      predniSONE 10 MG tablet   Commonly known as: DELTASONE   Take 1 tablet (10 mg total) by mouth daily.         CONTINUE taking these medications         methadone 10 MG tablet   Commonly known as: DOLOPHINE   Take 12 tablets (120 mg total) by mouth daily.         STOP taking these medications         ALPRAZolam 0.5 MG tablet      escitalopram 20 MG tablet          Where to get your medications    These are the prescriptions that you need to pick up.   You may get these medications from any pharmacy.         clonazePAM 0.5 MG tablet   methadone 10 MG tablet   predniSONE 10 MG tablet            Discharged Condition:fair    Consults:none  Significant Diagnostic Studies: No results found.  Lab Results: No results found for this or any previous visit (from the past 48 hour(s)). Recent Results (from the past 240 hour(s))  URINE CULTURE     Status: Normal   Collection Time   03/07/12  9:38 PM      Component Value Range Status Comment   Specimen Description URINE, CLEAN CATCH   Final    Special Requests ADDED ON [id number] @2244    Final    Culture  Setup Time [id number]   Final    Colony Count 8,000 COLONIES/ML   Final    Culture INSIGNIFICANT GROWTH   Final    Report Status 03/08/2012 FINAL   Final   MRSA PCR SCREENING     Status: Normal   Collection Time   03/08/12  2:10 AM      Component Value Range Status  Comment   MRSA by PCR NEGATIVE  NEGATIVE  Final      Hospital Course:  41 yo woman with hx of vasculitis was admitted from the ED with c/o pain full numerous skin lesions. Back in 2010 a biopsy obtained from similar looking lesions was read as vasculitis. She was treated intermittently with steroid tapers since then but she was unable to f/u with a rheumatologist. She presented to the ED on 03/07/12 via EMS for multiple skin lesions and joint pains. She was admitted for pain control and to initiate steroids. No vital organ was affected. Patient was treated with prednisone po and for now I have recommended to use 10 mg daily of prednisone until she can see a rheumatologist . Alternatively she can be tapered very slowly by 1 mg/week to prevent recurrence of the skin lesions.  Patient c/o severe anxiety. She was taking alprazolam as needed. I have switched her to bid klonopin in an effort to optimize her symptom control  Discharge Exam: Blood pressure 111/62, pulse 65, temperature 97.8 F (36.6 C), temperature source Oral, resp. rate 18, height 5\' 5"  (1.651 m), weight 87.6 kg (193 lb 2 oz), last menstrual period 02/18/2012, SpO2 97.00%. Alert and oriented x3 CVS: RRR RS: CTAB    Disposition: home  Discharge Orders    Future Orders Please Complete By Expires   Diet general      Increase activity slowly         Follow-up Information    Schedule an appointment as soon as possible for a visit with Yisroel Ramming, MD.   Contact information:   [address] [place] [address] [phone]          Signed: Lonia Blood 03/15/2012, 10:18 PM

## 2012-03-09 NOTE — Progress Notes (Signed)
Subjective: Decrease pain     Physical Exam: Blood pressure 111/62, pulse 65, temperature 97.8 F (36.6 C), temperature source Oral, resp. rate 18, height 5\' 5"  (1.651 m), weight 87.6 kg (193 lb 2 oz), last menstrual period 02/18/2012, SpO2 97.00%.    Investigations:  Recent Results (from the past 240 hour(s))  URINE CULTURE     Status: Normal   Collection Time   03/07/12  9:38 PM      Component Value Range Status Comment   Specimen Description URINE, CLEAN CATCH   Final    Special Requests ADDED ON [id number] @2244    Final    Culture  Setup Time [id number]   Final    Colony Count 8,000 COLONIES/ML   Final    Culture INSIGNIFICANT GROWTH   Final    Report Status 03/08/2012 FINAL   Final   MRSA PCR SCREENING     Status: Normal   Collection Time   03/08/12  2:10 AM      Component Value Range Status Comment   MRSA by PCR NEGATIVE  NEGATIVE  Final      Basic Metabolic Panel:  Basename 03/09/12 0520 03/08/12 0500  NA 135 135  K 4.2 4.1  CL 102 102  CO2 22 25  GLUCOSE 130* 133*  BUN 11 9  CREATININE 0.56 0.55  CALCIUM 9.3 9.0  MG -- --  PHOS -- --   Liver Function Tests: No results found for this basename: AST:2,ALT:2,ALKPHOS:2,BILITOT:2,PROT:2,ALBUMIN:2 in the last 72 hours   CBC:  Basename 03/09/12 0520 03/08/12 0500 03/07/12 2137  WBC 16.0* 13.6* --  NEUTROABS 13.6* -- 8.9*  HGB 13.1 13.1 --  HCT 38.7 39.4 --  MCV 86.4 87.4 --  PLT 287 281 --    No results found.    Medications:  Scheduled:   . sodium chloride   Intravenous STAT  . cefTRIAXone (ROCEPHIN)  IV  1 g Intravenous Q24H  . enoxaparin  40 mg Subcutaneous Daily  . escitalopram  20 mg Oral Daily  . methadone  120 mg Oral Daily  . predniSONE  10 mg Oral PC lunch  . predniSONE  10 mg Oral PC supper  . predniSONE  10 mg Oral 3 x daily with food  . predniSONE  10 mg Oral 4X daily taper  . predniSONE  20 mg Oral AC breakfast  . predniSONE  20 mg Oral Nightly  . predniSONE  20 mg Oral Nightly     Continuous:   . DISCONTD: sodium chloride 100 mL/hr at 03/08/12 0208   YQM:[id number], acetaminophen, ALPRAZolam, alum & mag hydroxide-simeth, HYDROmorphone, ondansetron (ZOFRAN) IV, ondansetron, oxyCODONE, zolpidem  Impression:  Principal Problem:  *Leukocytoclastic vasculitis Active Problems:  UTI (lower urinary tract infection)  Leukocytosis  Chronic pain  SLE exacerbation     Plan: prednisone daily Dc home     LOS: 2 days   Henna Derderian, MD Pager: [phone] 03/09/2012, 8:50 AM   pager [phone]

## 2012-04-03 ENCOUNTER — Encounter (HOSPITAL_COMMUNITY): Payer: Self-pay

## 2012-04-03 ENCOUNTER — Emergency Department (HOSPITAL_COMMUNITY)
Admission: EM | Admit: 2012-04-03 | Discharge: 2012-04-03 | Disposition: A | Discharge status: Home / Self Care | Payer: Self-pay | Attending: Emergency Medicine | Admitting: Emergency Medicine

## 2012-04-03 DIAGNOSIS — F329 Major depressive disorder, single episode, unspecified: Secondary | ICD-10-CM | POA: Insufficient documentation

## 2012-04-03 DIAGNOSIS — M79609 Pain in unspecified limb: Secondary | ICD-10-CM | POA: Insufficient documentation

## 2012-04-03 DIAGNOSIS — M329 Systemic lupus erythematosus, unspecified: Secondary | ICD-10-CM | POA: Insufficient documentation

## 2012-04-03 DIAGNOSIS — M25449 Effusion, unspecified hand: Secondary | ICD-10-CM | POA: Insufficient documentation

## 2012-04-03 DIAGNOSIS — F17200 Nicotine dependence, unspecified, uncomplicated: Secondary | ICD-10-CM | POA: Insufficient documentation

## 2012-04-03 DIAGNOSIS — G8929 Other chronic pain: Secondary | ICD-10-CM | POA: Insufficient documentation

## 2012-04-03 DIAGNOSIS — F3289 Other specified depressive episodes: Secondary | ICD-10-CM | POA: Insufficient documentation

## 2012-04-03 MED ORDER — HYDROMORPHONE HCL PF 2 MG/ML IJ SOLN
2.0000 mg | Freq: Once | INTRAMUSCULAR | Status: AC
  Administered 2012-04-03: 2 mg via INTRAMUSCULAR
  Filled 2012-04-03: qty 1

## 2012-04-03 MED ORDER — OXYCODONE-ACETAMINOPHEN 5-325 MG PO TABS
1.0000 | ORAL_TABLET | Freq: Once | ORAL | Status: AC
  Administered 2012-04-03: 1 via ORAL
  Filled 2012-04-03: qty 1

## 2012-04-03 MED ORDER — HYDROMORPHONE HCL PF 2 MG/ML IJ SOLN
2.0000 mg | Freq: Once | INTRAMUSCULAR | Status: AC
  Administered 2012-04-03: 2 mg via INTRAVENOUS
  Filled 2012-04-03: qty 1

## 2012-04-03 MED ORDER — KETOROLAC TROMETHAMINE 30 MG/ML IJ SOLN
30.0000 mg | Freq: Once | INTRAMUSCULAR | Status: AC
  Administered 2012-04-03: 30 mg via INTRAVENOUS
  Filled 2012-04-03: qty 1

## 2012-04-03 NOTE — Discharge Instructions (Signed)

## 2012-04-03 NOTE — ED Provider Notes (Signed)
History     CSN: [id number]  Arrival date & time 04/03/12  0121   First MD Initiated Contact with Patient 04/03/12 0154      Chief Complaint  Patient presents with  . Lupus     The history is provided by the patient and medical records.   the patient reports long-standing history of lupus and ongoing chronic pain in her joints specifically her hands.  She reports she was in the methadone clinic for pain control however stopped going 5 days ago because she was unable to afford the $14 a day charge.  She presents now complaining of pain in her bilateral hands unrelieved by Tylenol at home.  [facility] and reports that that physician does not provide her pain medications.  She reports she has an appointment with lawyer and a Dr. tomorrow regarding her disability.  She denies fevers or chills.  She has no spreading erythema.  She has no other complaints.  Her pain is severe at this time   Past Medical History  Diagnosis Date  . Depression   . Chronic pain   . Leukocytoclastic vasculitis   . Tobacco abuse   . Lupus     Past Surgical History  Procedure Date  . Cholecystectomy     History reviewed. No pertinent family history.  History  Substance Use Topics  . Smoking status: Current Everyday Smoker -- 1.0 packs/day    Types: Cigarettes  . Smokeless tobacco: Never Used  . Alcohol Use: No    OB History    Grav Para Term Preterm Abortions TAB SAB Ect Mult Living                  Review of Systems  All other systems reviewed and are negative.    Allergies  Review of patient's allergies indicates no known allergies.  Home Medications   Current Outpatient Rx  Name Route Sig Dispense Refill  . CLONAZEPAM 0.5 MG PO TABS Oral Take 1 tablet (0.5 mg total) by mouth 2 (two) times daily. 60 tablet 0  . METHADONE HCL 10 MG PO TABS Oral Take 12 tablets (120 mg total) by mouth daily. 30 tablet   . PREDNISONE 10 MG PO TABS Oral Take 1 tablet  (10 mg total) by mouth daily. 30 tablet 1    BP 130/99  Pulse 102  Temp(Src) 97.4 F (36.3 C) (Oral)  Resp 17  SpO2 96%  LMP 02/18/2012  Physical Exam  Nursing note and vitals reviewed. Constitutional: She is oriented to person, place, and time. She appears well-developed and well-nourished. No distress.  HENT:  Head: Normocephalic and atraumatic.  Eyes: EOM are normal.  Neck: Normal range of motion.  Cardiovascular: Normal rate, regular rhythm and normal heart sounds.   Pulmonary/Chest: Effort normal and breath sounds normal.  Abdominal: Soft. She exhibits no distension. There is no tenderness.  Musculoskeletal: Normal range of motion.       Mild chronic swelling of her MCP and PIP joints of her bilateral hand without erythema warmth or tenderness to palpation  Neurological: She is alert and oriented to person, place, and time.  Skin: Skin is warm and dry.  Psychiatric: She has a normal mood and affect. Judgment normal.    ED Course  Procedures (including critical care time)  Labs Reviewed - No data to display No results found.   1. Chronic pain       MDM  The patient is  a long-standing history of chronic pain.  She was on methadone and being followed in the methadone clinic up until 5 days ago.  Her pain was treated with IV Dilaudid in the emergency department.  She was given a Percocet.  She will be discharged home to follow up with her physicians regarding her ongoing chronic pain.  Medical screening examination completed and no life-threatening emergency present       Lyanne Co, MD 04/03/12 [phone]

## 2012-04-03 NOTE — ED Notes (Addendum)
Dr. Patria Mane has written discharge instructions for patient. Patient does not want to be discharged.  She states she needs to see another doctor to be taken care of.

## 2012-04-03 NOTE — ED Notes (Signed)
Patient is tearful, stating that she wants to be admitted and that is the only way she gets help.  She states we do not believe she is hurting.  Patient received Hydromorphone 2 mg IM in left gluteus per order at this time.  I offered emotional and physical support to patient.

## 2012-04-03 NOTE — ED Notes (Signed)
Pt was here earlier tonight and discharged, she sat in the lobby and upon checking back in she states that she's in bad pain in her hands and feet, pt crying in triage

## 2012-04-03 NOTE — ED Notes (Signed)
Pt asking when next shift begins upon discharge.  Pt calling for ride and will be d/c to lobby.

## 2012-04-03 NOTE — ED Notes (Signed)
MD at bedside.  Dr. Patria Mane talking to patient.

## 2012-04-03 NOTE — ED Provider Notes (Signed)
History     CSN: [id number]  Arrival date & time 04/03/12  1308   First MD Initiated Contact with Patient 04/03/12 [phone]      Chief Complaint  Patient presents with  . Extremity Pain    HPI The history is provided by the patient and medical records.   The patient was seen in the emergency department several hours before checking back in.  She reports her pain is still severe at this time and that she needs pain medications.  the patient reports long-standing history of lupus and ongoing chronic pain in her joints specifically her hands. She reports she was in the methadone clinic for pain control however stopped going 5 days ago because she was unable to afford the $14 a day charge. She presents now complaining of pain in her bilateral hands unrelieved by Tylenol at home. She was a [facility] and reports that that physician does not provide her pain medications. She reports she has an appointment with lawyer and a Dr. tomorrow regarding her disability. She denies fevers or chills. She has no spreading erythema. She has no other complaints. Her pain is severe at this time     Past Medical History  Diagnosis Date  . Depression   . Chronic pain   . Leukocytoclastic vasculitis   . Tobacco abuse   . Lupus     Past Surgical History  Procedure Date  . Cholecystectomy     History reviewed. No pertinent family history.  History  Substance Use Topics  . Smoking status: Current Everyday Smoker -- 1.0 packs/day    Types: Cigarettes  . Smokeless tobacco: Never Used  . Alcohol Use: No    OB History    Grav Para Term Preterm Abortions TAB SAB Ect Mult Living                  Review of Systems  All other systems reviewed and are negative.    Allergies  Review of patient's allergies indicates no known allergies.  Home Medications   Current Outpatient Rx  Name Route Sig Dispense Refill  . CLONAZEPAM 0.5 MG PO TABS Oral Take 1 tablet (0.5 mg  total) by mouth 2 (two) times daily. 60 tablet 0  . METHADONE HCL 10 MG PO TABS Oral Take 12 tablets (120 mg total) by mouth daily. 30 tablet   . PREDNISONE 10 MG PO TABS Oral Take 1 tablet (10 mg total) by mouth daily. 30 tablet 1    BP 150/92  Pulse 96  Temp(Src) 97.6 F (36.4 C) (Oral)  Resp 20  SpO2 100%  LMP 02/18/2012  Physical Exam Nursing note and vitals reviewed.  Constitutional: She is oriented to person, place, and time. She appears well-developed and well-nourished. No distress.  HENT:  Head: Normocephalic and atraumatic.  Eyes: EOM are normal.  Neck: Normal range of motion.  Cardiovascular: Normal rate, regular rhythm and normal heart sounds.  Pulmonary/Chest: Effort normal and breath sounds normal.  Abdominal: Soft. She exhibits no distension. There is no tenderness.  Musculoskeletal: Normal range of motion.  Mild chronic swelling of her MCP and PIP joints of her bilateral hand without erythema warmth or tenderness to palpation  Neurological: She is alert and oriented to person, place, and time.  Skin: Skin is warm and dry.  Psychiatric: She has a normal mood and affect. Judgment normal.   ED Course  Procedures (including critical care time)  Labs Reviewed - No data to  display No results found.   1. Chronic pain       MDM  The patient's pain was treated again with IM Dilaudid.  Although she reports the pain is mildly better at this time she still reports ongoing pain.  This is chronic pain I recommended that she followup with her doctors regarding her pain.  Medical screening examination completed no evidence of life-threatening emergency is present at this time        Lyanne Co, MD 04/03/12 [phone]

## 2012-04-03 NOTE — ED Notes (Signed)
Pt stating she will be right back in hospital.  Wanting more pain meds.  Additional meds given.  Md Aware

## 2012-04-03 NOTE — ED Notes (Signed)
Pt did sign signature for d/c.  Signature not working.

## 2012-04-03 NOTE — ED Notes (Signed)
Per EMS, pt from home.  C/O lupus pain in hands and feet.  Vitals in route:  144/98, pulse 100, resp 20, sats 98%.  Hx. lupus

## 2012-04-03 NOTE — ED Notes (Signed)
[id number]<JY> Expected date:<BR> Expected time:<BR> Means of arrival:<BR> Comments:<BR> PTAR-lupus flare/pain hands/feet

## 2012-04-18 ENCOUNTER — Encounter (HOSPITAL_BASED_OUTPATIENT_CLINIC_OR_DEPARTMENT_OTHER): Payer: Self-pay

## 2012-04-18 ENCOUNTER — Emergency Department (HOSPITAL_BASED_OUTPATIENT_CLINIC_OR_DEPARTMENT_OTHER)
Admission: EM | Admit: 2012-04-18 | Discharge: 2012-04-18 | Disposition: A | Discharge status: Home / Self Care | Payer: Self-pay | Attending: Emergency Medicine | Admitting: Emergency Medicine

## 2012-04-18 DIAGNOSIS — Z7901 Long term (current) use of anticoagulants: Secondary | ICD-10-CM | POA: Insufficient documentation

## 2012-04-18 DIAGNOSIS — F17200 Nicotine dependence, unspecified, uncomplicated: Secondary | ICD-10-CM | POA: Insufficient documentation

## 2012-04-18 DIAGNOSIS — M329 Systemic lupus erythematosus, unspecified: Secondary | ICD-10-CM | POA: Insufficient documentation

## 2012-04-18 DIAGNOSIS — M310 Hypersensitivity angiitis: Secondary | ICD-10-CM

## 2012-04-18 DIAGNOSIS — F329 Major depressive disorder, single episode, unspecified: Secondary | ICD-10-CM | POA: Insufficient documentation

## 2012-04-18 DIAGNOSIS — M79606 Pain in leg, unspecified: Secondary | ICD-10-CM

## 2012-04-18 DIAGNOSIS — F3289 Other specified depressive episodes: Secondary | ICD-10-CM | POA: Insufficient documentation

## 2012-04-18 DIAGNOSIS — Z79899 Other long term (current) drug therapy: Secondary | ICD-10-CM | POA: Insufficient documentation

## 2012-04-18 DIAGNOSIS — M25569 Pain in unspecified knee: Secondary | ICD-10-CM | POA: Insufficient documentation

## 2012-04-18 DIAGNOSIS — G8929 Other chronic pain: Secondary | ICD-10-CM | POA: Insufficient documentation

## 2012-04-18 MED ORDER — HYDROMORPHONE HCL PF 2 MG/ML IJ SOLN
2.0000 mg | Freq: Once | INTRAMUSCULAR | Status: AC
  Administered 2012-04-18: 2 mg via INTRAMUSCULAR
  Filled 2012-04-18: qty 11
  Filled 2012-04-18: qty 1

## 2012-04-18 MED ORDER — OXYCODONE-ACETAMINOPHEN 5-325 MG PO TABS: 1.0000 | ORAL_TABLET | Freq: Four times a day (QID) | ORAL | Status: AC | PRN

## 2012-04-18 MED ORDER — METHYLPREDNISOLONE SODIUM SUCC 125 MG IJ SOLR
125.0000 mg | Freq: Once | INTRAMUSCULAR | Status: AC
  Administered 2012-04-18: 125 mg via INTRAMUSCULAR
  Filled 2012-04-18: qty 2

## 2012-04-18 MED ORDER — HYDROMORPHONE HCL PF 2 MG/ML IJ SOLN
2.0000 mg | Freq: Once | INTRAMUSCULAR | Status: AC
  Administered 2012-04-18: 2 mg via INTRAMUSCULAR
  Filled 2012-04-18: qty 1

## 2012-04-18 MED ORDER — PREDNISONE 10 MG PO TABS: 20.0000 mg | ORAL_TABLET | Freq: Two times a day (BID) | ORAL | Status: DC

## 2012-04-18 NOTE — ED Provider Notes (Signed)
History     CSN: [id number]  Arrival date & time 04/18/12  1810   First MD Initiated Contact with Patient 04/18/12 1826      Chief Complaint  Patient presents with  . Leg Pain    (Consider location/radiation/quality/duration/timing/severity/associated sxs/prior treatment) HPI Comments: The patient states that she has a history of Lupus and has occasional flareups that cause her excruciating pain.  She presents tonight with severe pain in her knees.  She is out of her percocet and prednisone.  She is having issues with medicaid and cannot see a doctor for her prescriptions.  She was seen two weeks ago for the same.  The history is provided by the patient.    Past Medical History  Diagnosis Date  . Depression   . Chronic pain   . Leukocytoclastic vasculitis   . Tobacco abuse   . Lupus   . Lupus     Past Surgical History  Procedure Date  . Cholecystectomy   . Tubal ligation   . Ankle surgery     No family history on file.  History  Substance Use Topics  . Smoking status: Current Everyday Smoker -- 1.0 packs/day    Types: Cigarettes  . Smokeless tobacco: Never Used  . Alcohol Use: No    OB History    Grav Para Term Preterm Abortions TAB SAB Ect Mult Living                  Review of Systems  All other systems reviewed and are negative.    Allergies  Review of patient's allergies indicates no known allergies.  Home Medications   Current Outpatient Rx  Name Route Sig Dispense Refill  . ALPRAZOLAM 0.5 MG PO TABS Oral Take 0.5 mg by mouth at bedtime as needed. Patient uses this medication for anxiety.    . CLONAZEPAM 0.5 MG PO TABS Oral Take 1 tablet (0.5 mg total) by mouth 2 (two) times daily. 60 tablet 0  . METHADONE HCL 10 MG PO TABS Oral Take 12 tablets (120 mg total) by mouth daily. 30 tablet   . PREDNISONE 10 MG PO TABS Oral Take 1 tablet (10 mg total) by mouth daily. 30 tablet 1    BP 96/57  Pulse 112  Temp(Src) 98.3 F (36.8 C) (Oral)  Resp  22  Ht 5\' 3"  (1.6 m)  Wt 200 lb (90.719 kg)  BMI 35.43 kg/m2  SpO2 100%  Physical Exam  Nursing note and vitals reviewed. Constitutional: She is oriented to person, place, and time. She appears well-developed and well-nourished.       Patient is tearful and crying, hyperventilating.    HENT:  Head: Normocephalic and atraumatic.  Neck: Normal range of motion. Neck supple.  Cardiovascular: Normal rate and regular rhythm.   Pulmonary/Chest: Effort normal.  Abdominal: Soft. Bowel sounds are normal.  Musculoskeletal: Normal range of motion.       Both knees are without swelling, warmth, or effusion.  She screams and cries with any movement.  Neurological: She is alert and oriented to person, place, and time.  Skin: Skin is warm and dry.    ED Course  Procedures (including critical care time)  Labs Reviewed - No data to display No results found.   No diagnosis found.    MDM  This patient is very dramatic and has a history of polysubstance abuse.  She became angry with me and upset with her care when I suggested that she needed one physician  to help her manage her pain.  She repeated told me about her woes with medicaid and her inability to afford to see a doctor.  Oddly enough, she tells me she has hired a Clinical research associate to help her with these issues, but can't afford to see a physician.    Upon reviewing the chart, I see multiple mentions of crack cocaine and narcotic abuse and she was recently in the methadone clinic.  She carries the diagnosis of leukocytoclastic vasculitis but I do not see where she has actually been diagnosed with Lupus.  There are multiple small, vasculitic lesions on the legs, but no joint effusions, warmth, or redness.  In summary, I treated the patient's pain with the dilaudid she has been accustomed to and I will prescribe her percocet and prednisone.  But I have informed her in no uncertain terms that I will not prescribe her any more narcotics for her chronic  pain.          Geoffery Lyons, MD 04/18/12 1946

## 2012-04-18 NOTE — ED Notes (Signed)
Pt reports having a Lupus flare up. States that her legs, knees, and feet are swollen and that she is getting blisters on her legs. Pt writhing in pain, moaning, "Oh Lord, Please help me." Pt states she has no pain medicine or prednisone. States that she is waiting to see the MD at [facility]. Reports that she was going to a methadone clinic, but can't afford to go anymore. Reports she's attempting to get Medicaid. Pt rates pain 10/10 and states "it hurts all over".

## 2012-04-18 NOTE — Discharge Instructions (Signed)
Pain of Unknown Etiology (Pain Without a Known Cause) You have come to your caregiver because of pain. Pain can occur in any part of the body. Often there is not a definite cause. If your laboratory (blood or urine) work was normal and x-rays or other studies were normal, your caregiver may treat you without knowing the cause of the pain. An example of this is the headache. Most headaches are diagnosed by taking a history. This means your caregiver asks you questions about your headaches. Your caregiver determines a treatment based on your answers. Usually testing done for headaches is normal. Often testing is not done unless there is no response to medications. Regardless of where your pain is located today, you can be given medications to make you comfortable. If no physical cause of pain can be found, most cases of pain will gradually leave as suddenly as they came.  If you have a painful condition and no reason can be found for the pain, It is importantthat you follow up with your caregiver. If the pain becomes worse or does not go away, it may be necessary to repeat tests and look further for a possible cause.  Only take over-the-counter or prescription medicines for pain, discomfort, or fever as directed by your caregiver.   For the protection of your privacy, test results can not be given over the phone. Make sure you receive the results of your test. Ask as to how these results are to be obtained if you have not been informed. It is your responsibility to obtain your test results.   You may continue all activities unless the activities cause more pain. When the pain lessens, it is important to gradually resume normal activities. Resume activities by beginning slowly and gradually increasing the intensity and duration of the activities or exercise. During periods of severe pain, bed-rest may be helpful. Lay or sit in any position that is comfortable.   Ice used for acute (sudden) conditions may be  effective. Use a large plastic bag filled with ice and wrapped in a towel. This may provide pain relief.   See your caregiver for continued problems. They can help or refer you for exercises or physical therapy if necessary.  If you were given medications for your condition, do not drive, operate machinery or power tools, or sign legal documents for 24 hours. Do not drink alcohol, take sleeping pills, or take other medications that may interfere with treatment. See your caregiver immediately if you have pain that is becoming worse and not relieved by medications. Document Released: 07/31/2001 Document Revised: 10/25/2011 Document Reviewed: 11/05/2005 [employer] Patient Information 2012 [employer]. 

## 2012-04-18 NOTE — ED Notes (Signed)
Pt reports generalized pain and leg pain since last night.  States she is out of medications.

## 2012-04-19 ENCOUNTER — Emergency Department (HOSPITAL_COMMUNITY)
Admission: EM | Admit: 2012-04-19 | Discharge: 2012-04-19 | Disposition: A | Discharge status: Home / Self Care | Payer: Self-pay | Attending: Emergency Medicine | Admitting: Emergency Medicine

## 2012-04-19 ENCOUNTER — Encounter (HOSPITAL_COMMUNITY): Payer: Self-pay | Admitting: Emergency Medicine

## 2012-04-19 DIAGNOSIS — F329 Major depressive disorder, single episode, unspecified: Secondary | ICD-10-CM | POA: Insufficient documentation

## 2012-04-19 DIAGNOSIS — Z79899 Other long term (current) drug therapy: Secondary | ICD-10-CM | POA: Insufficient documentation

## 2012-04-19 DIAGNOSIS — F3289 Other specified depressive episodes: Secondary | ICD-10-CM | POA: Insufficient documentation

## 2012-04-19 DIAGNOSIS — F17200 Nicotine dependence, unspecified, uncomplicated: Secondary | ICD-10-CM | POA: Insufficient documentation

## 2012-04-19 DIAGNOSIS — G8929 Other chronic pain: Secondary | ICD-10-CM | POA: Insufficient documentation

## 2012-04-19 DIAGNOSIS — M329 Systemic lupus erythematosus, unspecified: Secondary | ICD-10-CM | POA: Insufficient documentation

## 2012-04-19 MED ORDER — KETOROLAC TROMETHAMINE 30 MG/ML IJ SOLN
30.0000 mg | Freq: Once | INTRAMUSCULAR | Status: AC
  Administered 2012-04-19: 30 mg via INTRAMUSCULAR
  Filled 2012-04-19: qty 1

## 2012-04-19 NOTE — ED Provider Notes (Signed)
History     CSN: [id number]  Arrival date & time 04/19/12  1478   First MD Initiated Contact with Patient 04/19/12 0327      Chief Complaint  Patient presents with  . Joint Pain    (Consider location/radiation/quality/duration/timing/severity/associated sxs/prior treatment) HPI Comments: Patient with a history of lupus was seen 7 hours ago.  [facility] given 4 mg of Dilaudid IM, as well as 125 mg of Solu-Medrol IM.  She was given prescriptions for prednisone as well as Percocet, which he has not filled.  She is now in this emergency department requesting additional pain control.  She states she has no way to get around she can't get to a pharmacy.  She came to this emergency department by ambulance She has no new complaints.  She has not fallen  The history is provided by the patient.    Past Medical History  Diagnosis Date  . Depression   . Chronic pain   . Leukocytoclastic vasculitis   . Tobacco abuse   . Lupus   . Lupus     Past Surgical History  Procedure Date  . Cholecystectomy   . Tubal ligation   . Ankle surgery     History reviewed. No pertinent family history.  History  Substance Use Topics  . Smoking status: Current Everyday Smoker -- 1.0 packs/day    Types: Cigarettes  . Smokeless tobacco: Never Used  . Alcohol Use: No    OB History    Grav Para Term Preterm Abortions TAB SAB Ect Mult Living                  Review of Systems  Constitutional: Negative for fever and chills.  Gastrointestinal: Negative for nausea and diarrhea.  Musculoskeletal: Negative for back pain and joint swelling.  Skin: Negative for rash.  Neurological: Negative for dizziness.  Psychiatric/Behavioral: Positive for agitation.    Allergies  Review of patient's allergies indicates no known allergies.  Home Medications   Current Outpatient Rx  Name Route Sig Dispense Refill  . ALPRAZOLAM 0.5 MG PO TABS Oral Take 0.5 mg by mouth at bedtime as needed.  Patient uses this medication for anxiety.    . METHADONE HCL 10 MG PO TABS Oral Take 12 tablets (120 mg total) by mouth daily. 30 tablet   . CLONAZEPAM 0.5 MG PO TABS Oral Take 1 tablet (0.5 mg total) by mouth 2 (two) times daily. 60 tablet 0  . OXYCODONE-ACETAMINOPHEN 5-325 MG PO TABS Oral Take 1-2 tablets by mouth every 6 (six) hours as needed for pain. 15 tablet 0  . PREDNISONE 10 MG PO TABS Oral Take 2 tablets (20 mg total) by mouth 2 (two) times daily. 20 tablet 0    BP 160/105  Pulse 114  Temp(Src) 98.4 F (36.9 C) (Oral)  Resp 20  SpO2 100%  Physical Exam  Constitutional: She is oriented to person, place, and time. She appears well-developed and well-nourished.  HENT:  Head: Normocephalic.  Eyes: Pupils are equal, round, and reactive to light.  Neck: Normal range of motion.  Cardiovascular: Tachycardia present.   Pulmonary/Chest: Effort normal.  Abdominal: Soft.  Musculoskeletal: Normal range of motion. She exhibits no edema.  Neurological: She is alert and oriented to person, place, and time.  Skin: No rash noted.    ED Course  Procedures (including critical care time)  Labs Reviewed - No data to display No results found.   1. Chronic pain  MDM   Medication noncompliance        Arman Filter, NP 04/19/12 1610  Arman Filter, NP 04/19/12 [phone]

## 2012-04-19 NOTE — ED Notes (Signed)
[id number]<AV> Expected date:04/19/12<BR> Expected time: 3:05 AM<BR> Means of arrival:Ambulance<BR> Comments:<BR> EMS

## 2012-04-19 NOTE — ED Notes (Signed)
Per EMS: Pt comes home with c/o pain in joints. Pt has hx of lupus.  Pt has lesion on right elbow and hip. Pt is currently out of solumedrol x2 days. Pt was seen at [facility] on 5/31 and given rx of dilaudid but has not gotten it fill yet. Pt is ambulatory.

## 2012-04-19 NOTE — ED Notes (Signed)
patient history of Lupus with occasional flareups that cause her excruciating pain, as she states is at this time, 10/10. Pt presents tonight with severe pain, in her joints. Plan of care was given,pt is out of her percocet and prednisone and Rx is given from [facility] visit that was 2 hr ago, pt crying and states "I am poor and cannot afford this medications, I need to admitted for this pain control". Pt further explains that she is having issues with medicaid and cannot see a doctor or get her prescriptions at this time. Pt refuse leaving her room. FNP notified.

## 2012-04-19 NOTE — ED Provider Notes (Signed)
Medical screening examination/treatment/procedure(s) were performed by non-physician practitioner and as supervising physician I was immediately available for consultation/collaboration. Devoria Albe, MD, FACEP   Ward Givens, MD 04/19/12 [phone]

## 2012-04-19 NOTE — Discharge Instructions (Signed)
Chronic Back Pain When back pain lasts longer than 3 months, it is called chronic back pain.This pain can be frustrating, but the cause of the pain is rarely dangerous.People with chronic back pain often go through certain periods that are more intense (flare-ups). CAUSES Chronic back pain can be caused by wear and tear (degeneration) on different structures in your back. These structures may include bones, ligaments, or discs. This degeneration may result in more pressure being placed on the nerves that travel to your legs and feet. This can lead to pain traveling from the low back down the back of the legs. When pain lasts longer than 3 months, it is not unusual for people to experience anxiety or depression. Anxiety and depression can also contribute to low back pain. TREATMENT  Establish a regular exercise plan. This is critical to improving your functional level.   Have a self-management plan for when you flare-up. Flare-ups rarely require a medical visit. Regular exercise will help reduce the intensity and frequency of your flare-ups.   Manage how you feel about your back pain and the rest of your life. Anxiety, depression, and feeling that you cannot alter your back pain have been shown to make back pain more intense and debilitating.   Medicines should never be your only treatment. They should be used along with other treatments to help you return to a more active lifestyle.   Procedures such as injections or surgery may be helpful but are rarely necessary. You may be able to get the same results with physical therapy or chiropractic care.  HOME CARE INSTRUCTIONS  Avoid bending, heavy lifting, prolonged sitting, and activities which make the problem worse.   Continue normal activity as much as possible.   Take brief periods of rest throughout the day to reduce your pain during flare-ups.   Follow your back exercise rehabilitation program. This can help reduce symptoms and prevent  more pain.   Only take over-the-counter or prescription medicines as directed by your caregiver. Muscle relaxants are sometimes prescribed. Narcotic pain medicine is discouraged for long-term pain, since addiction is a possible outcome.   If you smoke, quit.   Eat healthy foods and maintain a recommended body weight.  SEEK IMMEDIATE MEDICAL CARE IF:   You have weakness or numbness in one of your legs or feet.   You have trouble controlling your bladder or bowels.   You develop nausea, vomiting, abdominal pain, shortness of breath, or fainting.  Document Released: 12/13/2004 Document Revised: 10/25/2011 Document Reviewed: 10/20/2011 [facility] Patient Information 2012 [place], [place]. You're given prescriptions earlier today for your chronic pain medication.  Please feel these and take as directed.  Please make an appointment with Dr. Amedeo Kinsman further prescriptions and pain control

## 2012-04-19 NOTE — ED Notes (Signed)
Pt c/o joint pain throughout body x2 days. Pt has hx of lupus and states she has been taking solumedrol but she ran out 2-3 days ago. Pt states she was seen at [facility] and received "2 shots of dilaudid and a steroid" and the pain subsided. However, when pt arrived back home the pain had returned and she has not been able to get rx of dilaudid that was given per [facility] MD.

## 2012-05-29 ENCOUNTER — Encounter (HOSPITAL_COMMUNITY): Payer: Self-pay | Admitting: Emergency Medicine

## 2012-05-29 ENCOUNTER — Emergency Department (HOSPITAL_COMMUNITY)
Admission: EM | Admit: 2012-05-29 | Discharge: 2012-05-29 | Disposition: A | Discharge status: Home / Self Care | Payer: Self-pay | Attending: Emergency Medicine | Admitting: Emergency Medicine

## 2012-05-29 DIAGNOSIS — L0291 Cutaneous abscess, unspecified: Secondary | ICD-10-CM

## 2012-05-29 DIAGNOSIS — L02419 Cutaneous abscess of limb, unspecified: Secondary | ICD-10-CM | POA: Insufficient documentation

## 2012-05-29 MED ORDER — SULFAMETHOXAZOLE-TMP DS 800-160 MG PO TABS
1.0000 | ORAL_TABLET | Freq: Once | ORAL | Status: AC
  Administered 2012-05-29: 1 via ORAL
  Filled 2012-05-29: qty 1

## 2012-05-29 MED ORDER — SULFAMETHOXAZOLE-TRIMETHOPRIM 800-160 MG PO TABS: 1.0000 | ORAL_TABLET | Freq: Two times a day (BID) | ORAL | Status: DC

## 2012-05-29 MED ORDER — BUPIVACAINE HCL (PF) 0.5 % IJ SOLN
10.0000 mL | Freq: Once | INTRAMUSCULAR | Status: AC
  Administered 2012-05-29: 10 mL
  Filled 2012-05-29: qty 10

## 2012-05-29 MED ORDER — OXYCODONE-ACETAMINOPHEN 5-325 MG PO TABS
2.0000 | ORAL_TABLET | Freq: Once | ORAL | Status: AC
  Administered 2012-05-29: 2 via ORAL
  Filled 2012-05-29: qty 2

## 2012-05-29 MED ORDER — OXYCODONE-ACETAMINOPHEN 5-325 MG PO TABS: 1.0000 | ORAL_TABLET | ORAL | Status: DC | PRN

## 2012-05-29 NOTE — ED Notes (Signed)
MD at bedside for I and D, pt tolerating well.

## 2012-05-29 NOTE — ED Provider Notes (Signed)
History   This chart was scribed for Raeford Razor, MD by Charolett Bumpers . The patient was seen in room TR05C/TR05C.    CSN: [id number]  Arrival date & time 05/29/12  1609   None     Chief Complaint  Patient presents with  . Abscess    (Consider location/radiation/quality/duration/timing/severity/associated sxs/prior treatment) HPI [name] is a 41 y.o. female who presents to the Emergency Department complaining of constant, moderate abscess to her left lateral ankle for the past 2-3 days. Pt reports associated purulent drainage. Patient states that her symptoms worsened last night with associated swelling and pain. 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 Pt denies any fevers chills. Patient denies any h/o diabetes. Patient reports that she normally gets abscesses on her buttocks and legs. Pt states that she has had lupus lesions on her left ankle previously. Pt also reports a h/o ankle surgery on her left ankle. Pt has been seen here for the same symptoms previously.    Past Medical History  Diagnosis Date  . Depression   . Chronic pain   . Leukocytoclastic vasculitis   . Tobacco abuse   . Lupus   . Lupus     Past Surgical History  Procedure Date  . Cholecystectomy   . Tubal ligation   . Ankle surgery     History reviewed. No pertinent family history.  History  Substance Use Topics  . Smoking status: Current Everyday Smoker -- 1.0 packs/day    Types: Cigarettes  . Smokeless tobacco: Never Used  . Alcohol Use: No    OB History    Grav Para Term Preterm Abortions TAB SAB Ect Mult Living                  Review of Systems  Constitutional: Negative for fever and chills.  Skin: Positive for wound.    Allergies  Review of patient's allergies indicates no known allergies.  Home Medications   Current Outpatient Rx  Name Route Sig Dispense Refill  . ALPRAZOLAM 0.5 MG PO TABS Oral Take 0.5 mg by mouth at bedtime as needed.  Patient uses this medication for anxiety.    . CLONAZEPAM 0.5 MG PO TABS Oral Take 0.5 mg by mouth at bedtime.    Marland Kitchen PREDNISONE 10 MG PO TABS Oral Take 2 tablets (20 mg total) by mouth 2 (two) times daily. 20 tablet 0  . CLONAZEPAM 0.5 MG PO TABS Oral Take 1 tablet (0.5 mg total) by mouth 2 (two) times daily. 60 tablet 0    BP 154/107  Pulse 102  Temp 98 F (36.7 C) (Oral)  Resp 20  SpO2 99%  Physical Exam  Nursing note and vitals reviewed. Constitutional: She is oriented to person, place, and time. She appears well-developed and well-nourished. No distress.  HENT:  Head: Normocephalic and atraumatic.  Eyes: EOM are normal.  Neck: Neck supple. No tracheal deviation present.  Cardiovascular: Normal rate, regular rhythm and normal heart sounds.   No murmur heard. Pulmonary/Chest: Effort normal and breath sounds normal. No respiratory distress. She has no wheezes.  Musculoskeletal: Normal range of motion.  Neurological: She is alert and oriented to person, place, and time.  Skin: Skin is warm and dry.       Near lateral malleolus of left ankle, lesion consistent with abscess. Fluctuance noted. Moderate tenderness to palpation. No significant surrounding signs of cellulitis. Neurovascularly intact distally. Mild serous drainage noted.  Psychiatric: She has a normal mood and affect. Her behavior is normal.    ED Course  Procedures (including critical care time)  INCISION AND DRAINAGE  Performed by: Raeford Razor, MD Authorized by: Raeford Razor, MD  Consent - Verbal Consent obtained Risks and benefits: risks/benefits and alternatives were discussed  Type: Abscess  Body Area: Left lateral ankle near lateral malleolus   Anesthesia: Local infiltration  Local anesthetic: bupivacaine 0.5% without epinephrine  Anesthetic total: 3 cc  Complexity: Complex  Blunt dissection to break up loculations  Drainage: Purulent  Drainage amount: moderate amount  Packing material:  1/4 iodoform gauze, bandage applied.   Patient tolerance: Patient tolerated the procedure well with no immediate complications. Pt has a h/o ankle surgery in the location of the abscess, no obvious visible hardware noted.   DIAGNOSTIC STUDIES: Oxygen Saturation is 99% on room air, normal by my interpretation.    COORDINATION OF CARE:  1627: Discussed planned course of treatment with the patient, who is agreeable at this time. Will start pt on abx and pain medication. Will I&D, patient is agreeable.  1630: Medication Orders: Bupivacaine (Marcaine) 0.5% injection 10 mL-once; Sulfamethoxazole-trimethoprim (Bactrim DS) 800-160 mg per tablet 1 tablet-once; Oxycodone-acetaminophen (Percocet) 5-325 mg per tablet 2 tablet-once.  1700: Preformed I&D of left lateral ankle abscess, discussed f/u in 2 days for a recheck.      Labs Reviewed - No data to display No results found.   1. Abscess       MDM  40 year old female with an abscess the left lower extremity. This was I&D at bedside. There is no evidence of surrounding cellulitis but given patient's history of chronic steroid use we'll give a short course of Bactrim. Patient instructed to followup for wound recheck. Return precautions sooner were discussed.  I personally preformed the services scribed in my presence. The recorded information has been reviewed and considered. Raeford Razor, MD.        Raeford Razor, MD 06/01/12 [phone]

## 2012-05-29 NOTE — ED Notes (Signed)
Pt c/o wound to left ankle from lupus; pt sts some purulent drainage and pain; pt seen here for same

## 2012-05-30 ENCOUNTER — Encounter (HOSPITAL_COMMUNITY): Payer: Self-pay

## 2012-05-30 ENCOUNTER — Emergency Department (HOSPITAL_COMMUNITY)
Admission: EM | Admit: 2012-05-30 | Discharge: 2012-05-30 | Disposition: A | Discharge status: Home / Self Care | Payer: Self-pay | Attending: Emergency Medicine | Admitting: Emergency Medicine

## 2012-05-30 ENCOUNTER — Inpatient Hospital Stay (HOSPITAL_COMMUNITY)
Admission: EM | Admit: 2012-05-30 | Discharge: 2012-06-03 | DRG: 607 | Disposition: A | Discharge status: Home / Self Care | Payer: Self-pay | Attending: Internal Medicine | Admitting: Internal Medicine

## 2012-05-30 DIAGNOSIS — F17200 Nicotine dependence, unspecified, uncomplicated: Secondary | ICD-10-CM | POA: Insufficient documentation

## 2012-05-30 DIAGNOSIS — M329 Systemic lupus erythematosus, unspecified: Secondary | ICD-10-CM | POA: Diagnosis present

## 2012-05-30 DIAGNOSIS — D72829 Elevated white blood cell count, unspecified: Secondary | ICD-10-CM | POA: Diagnosis present

## 2012-05-30 DIAGNOSIS — F3289 Other specified depressive episodes: Secondary | ICD-10-CM | POA: Diagnosis present

## 2012-05-30 DIAGNOSIS — A4901 Methicillin susceptible Staphylococcus aureus infection, unspecified site: Secondary | ICD-10-CM | POA: Diagnosis present

## 2012-05-30 DIAGNOSIS — G8929 Other chronic pain: Secondary | ICD-10-CM | POA: Diagnosis present

## 2012-05-30 DIAGNOSIS — Z79899 Other long term (current) drug therapy: Secondary | ICD-10-CM

## 2012-05-30 DIAGNOSIS — L989 Disorder of the skin and subcutaneous tissue, unspecified: Principal | ICD-10-CM | POA: Diagnosis present

## 2012-05-30 DIAGNOSIS — F329 Major depressive disorder, single episode, unspecified: Secondary | ICD-10-CM | POA: Diagnosis present

## 2012-05-30 DIAGNOSIS — F411 Generalized anxiety disorder: Secondary | ICD-10-CM | POA: Diagnosis present

## 2012-05-30 DIAGNOSIS — E669 Obesity, unspecified: Secondary | ICD-10-CM | POA: Insufficient documentation

## 2012-05-30 DIAGNOSIS — R21 Rash and other nonspecific skin eruption: Secondary | ICD-10-CM | POA: Diagnosis present

## 2012-05-30 DIAGNOSIS — L02419 Cutaneous abscess of limb, unspecified: Secondary | ICD-10-CM | POA: Insufficient documentation

## 2012-05-30 DIAGNOSIS — R52 Pain, unspecified: Secondary | ICD-10-CM

## 2012-05-30 DIAGNOSIS — N390 Urinary tract infection, site not specified: Secondary | ICD-10-CM

## 2012-05-30 DIAGNOSIS — L0291 Cutaneous abscess, unspecified: Secondary | ICD-10-CM

## 2012-05-30 DIAGNOSIS — R000 Tachycardia, unspecified: Secondary | ICD-10-CM | POA: Diagnosis present

## 2012-05-30 LAB — POCT I-STAT, CHEM 8
Calcium, Ion: 1.14 mmol/L (ref 1.12–1.23)
Glucose, Bld: 109 mg/dL — ABNORMAL HIGH (ref 70–99)
HCT: 47 % — ABNORMAL HIGH (ref 36.0–46.0)
Hemoglobin: 16 g/dL — ABNORMAL HIGH (ref 12.0–15.0)
Potassium: 4.1 mEq/L (ref 3.5–5.1)

## 2012-05-30 LAB — CBC
HCT: 44.5 % (ref 36.0–46.0)
MCHC: 34.6 g/dL (ref 30.0–36.0)
Platelets: 322 10*3/uL (ref 150–400)
RDW: 14.9 % (ref 11.5–15.5)
WBC: 20.1 10*3/uL — ABNORMAL HIGH (ref 4.0–10.5)

## 2012-05-30 LAB — CBC WITH DIFFERENTIAL/PLATELET
Basophils Relative: 0 % (ref 0–1)
Eosinophils Absolute: 0.2 10*3/uL (ref 0.0–0.7)
MCH: 30 pg (ref 26.0–34.0)
MCHC: 34 g/dL (ref 30.0–36.0)
Neutrophils Relative %: 75 % (ref 43–77)
Platelets: 243 10*3/uL (ref 150–400)

## 2012-05-30 LAB — RAPID URINE DRUG SCREEN, HOSP PERFORMED
Amphetamines: NOT DETECTED
Barbiturates: NOT DETECTED
Benzodiazepines: POSITIVE — AB
Cocaine: NOT DETECTED

## 2012-05-30 MED ORDER — HEPARIN SODIUM (PORCINE) 5000 UNIT/ML IJ SOLN
5000.0000 [IU] | Freq: Three times a day (TID) | INTRAMUSCULAR | Status: DC
  Administered 2012-05-31 – 2012-06-03 (×11): 5000 [IU] via SUBCUTANEOUS
  Filled 2012-05-30 (×14): qty 1

## 2012-05-30 MED ORDER — HYDROXYZINE HCL 50 MG/ML IM SOLN
25.0000 mg | Freq: Four times a day (QID) | INTRAMUSCULAR | Status: DC | PRN
  Administered 2012-05-31: 25 mg via INTRAMUSCULAR
  Filled 2012-05-30: qty 1

## 2012-05-30 MED ORDER — OXYCODONE-ACETAMINOPHEN 5-325 MG PO TABS
2.0000 | ORAL_TABLET | Freq: Once | ORAL | Status: AC
  Administered 2012-05-30: 2 via ORAL
  Filled 2012-05-30: qty 2

## 2012-05-30 MED ORDER — CLONAZEPAM 0.5 MG PO TABS
0.5000 mg | ORAL_TABLET | Freq: Every day | ORAL | Status: DC
  Administered 2012-05-31: 0.5 mg via ORAL
  Filled 2012-05-30: qty 1

## 2012-05-30 MED ORDER — VANCOMYCIN HCL 1000 MG IV SOLR
1250.0000 mg | Freq: Two times a day (BID) | INTRAVENOUS | Status: DC
  Administered 2012-05-30 – 2012-06-02 (×6): 1250 mg via INTRAVENOUS
  Filled 2012-05-30 (×7): qty 1250

## 2012-05-30 MED ORDER — HYDROMORPHONE HCL PF 1 MG/ML IJ SOLN
1.0000 mg | Freq: Once | INTRAMUSCULAR | Status: AC
  Administered 2012-05-30: 1 mg via INTRAVENOUS
  Filled 2012-05-30: qty 1

## 2012-05-30 MED ORDER — HYDROMORPHONE HCL PF 2 MG/ML IJ SOLN
2.0000 mg | Freq: Once | INTRAMUSCULAR | Status: AC
  Administered 2012-05-30: 2 mg via INTRAVENOUS
  Filled 2012-05-30: qty 1

## 2012-05-30 MED ORDER — OXYCODONE-ACETAMINOPHEN 5-325 MG PO TABS: 2.0000 | ORAL_TABLET | ORAL | Status: DC | PRN

## 2012-05-30 MED ORDER — HYDROMORPHONE HCL PF 2 MG/ML IJ SOLN
2.0000 mg | Freq: Once | INTRAMUSCULAR | Status: AC
  Administered 2012-05-30: 2 mg via INTRAMUSCULAR
  Filled 2012-05-30: qty 1

## 2012-05-30 MED ORDER — OXYCODONE-ACETAMINOPHEN 5-325 MG PO TABS
1.0000 | ORAL_TABLET | ORAL | Status: DC | PRN
  Administered 2012-05-30 – 2012-05-31 (×6): 1 via ORAL
  Filled 2012-05-30 (×6): qty 1

## 2012-05-30 MED ORDER — PREDNISONE 20 MG PO TABS
20.0000 mg | ORAL_TABLET | Freq: Two times a day (BID) | ORAL | Status: DC
  Administered 2012-05-31 – 2012-06-03 (×8): 20 mg via ORAL
  Filled 2012-05-30 (×10): qty 1

## 2012-05-30 MED ORDER — KETOROLAC TROMETHAMINE 10 MG PO TABS
10.0000 mg | ORAL_TABLET | Freq: Four times a day (QID) | ORAL | Status: DC
  Administered 2012-05-31 – 2012-06-03 (×15): 10 mg via ORAL
  Filled 2012-05-30 (×18): qty 1

## 2012-05-30 MED ORDER — ALPRAZOLAM 0.5 MG PO TABS
0.5000 mg | ORAL_TABLET | Freq: Every evening | ORAL | Status: DC | PRN
  Administered 2012-05-30 – 2012-06-01 (×3): 0.5 mg via ORAL
  Filled 2012-05-30: qty 2
  Filled 2012-05-30 (×2): qty 1

## 2012-05-30 NOTE — H&P (Signed)
Hospital Admission Note Date: 05/30/2012  Patient name: [name] Medical record number: [id number] Date of birth: 1971/04/21 Age: 41 y.o. Gender: female PCP: Yisroel Ramming, MD  Medical Service: Internal Medicine Teaching Service   Chief Complaint: "It hurts all over"  History of Present Illness:  [name] is a 41 yo woman with PMH of SLE who presents today for pain control.  She broke out with a rash all over her body after she left the ED yesterday after presenting with a similar lesion on her left ankle. She feels like the rash is "on fire" and painful especially on her feet and her buttocks, 9/10 in severity.  No exacerbation factor.  Dilaudid/Morphine help whenever she comes to the ED. She states the percocet she received in the ED yesterday is not strong enough, and did not relieve her pain. She states she has similar episodes 2-3 times a month, for which she has similarly been treated with either dilaudid or morphine.  The patient states that she has not scratched the lesions and that they have not been pruritic. She states that she has scars from previous lesions where they have subsequently healed. She also complains of painful and swollen joints.  She states she was first diagnosed with lupus about 3 years ago when her mother passed away.  She is being seen at [facility] and states that they are sending her to Rheumatology at [facility]. Denies any fever, chills, N/V, abd pain, SOB, chest pain, or fatigue, blood in urine.  She has an increase in urinary frequency over the past 3 years as well.   She takes Prednisone 10mg  daily for these flairs, but reports that only opioids have given her any real relief. The patient does not have a PCP or rheumatologist that she has seen since developing the disease, and does not appear to have a true diagnosis of SLE that she can relate. She has only sought care for these episodes in the ED and at [facility] (but does not have a PCP there).  ROS: as per  HPI  Tobacco: 1ppd x30 yrs  Alcohol: none  Illicits: denied current use of any controlled substances other than prescribed opioids and benzos. The patient has a history of opioid dependence, and an overdose as recent as two months ago on a fentanyl patch. She states she was in a methadone clinic, but "weaned herself off". Also states she has a history of crack cocaine and marijuana use, but states that she is not currently using any of these drugs.  Meds: Current Outpatient Rx  Name Route Sig Dispense Refill  . ALPRAZOLAM 0.5 MG PO TABS Oral Take 0.5 mg by mouth at bedtime as needed. Patient uses this medication for anxiety.    . CLONAZEPAM 0.5 MG PO TABS Oral Take 0.5 mg by mouth at bedtime.    . OXYCODONE-ACETAMINOPHEN 5-325 MG PO TABS Oral Take 1 tablet by mouth every 4 (four) hours as needed for pain. 10 tablet 0  . PREDNISONE 10 MG PO TABS Oral Take 2 tablets (20 mg total) by mouth 2 (two) times daily. 20 tablet 0    Allergies: Allergies as of 05/30/2012  . (No Known Allergies)   Past Medical History  Diagnosis Date  . Depression   . Chronic pain   . Leukocytoclastic vasculitis   . Tobacco abuse   . Lupus   . Lupus    Past Surgical History  Procedure Date  . Cholecystectomy   . Tubal ligation   .  Ankle surgery    No family history on file. History   Social History  . Marital Status: Divorced    Spouse Name: N/A    Number of Children: N/A  . Years of Education: N/A   Occupational History  . Not on file.   Social History Main Topics  . Smoking status: Current Everyday Smoker -- 1.0 packs/day    Types: Cigarettes  . Smokeless tobacco: Never Used  . Alcohol Use: No  . Drug Use: No  . Sexually Active:    Other Topics Concern  . Not on file   Social History Narrative  . No narrative on file   Physical Exam: Blood pressure 132/93, pulse 108, temperature 97 F (36.1 C), temperature source Oral, resp. rate 22, height 5' 2.99" (1.6 m), weight 199 lb 15.3 oz  (90.7 kg), SpO2 92.00%. BP 132/93  Pulse 108  Temp 97 F (36.1 C) (Oral)  Resp 22  Ht 5' 2.99" (1.6 m)  Wt 199 lb 15.3 oz (90.7 kg)  BMI 35.43 kg/m2  SpO2 92%  General Appearance:    Alert, oriented.   HEENT:   Normocephalic, without obvious abnormality, atraumatic. Two wart-like lesions present on the superior aspect of tongue, one on right,one on left.   Neck:  Supple, symmetrical, trachea midline  Lungs:     Clear to auscultation bilaterally, respirations unlabored  Chest Wall:    No tenderness or deformity   Heart:    Tachycardic. Normal rhythm. S1 and S2 normal, no murmur, rub or gallop  Abdomen:     Soft, non-tender, bowel sounds active all four quadrants,    no masses, no organomegaly  Extremities:   Extremities normal, atraumatic, no cyanosis or edema  Pulses:   2+ and symmetric all extremities  Skin:   Papular lesions noted on extensor surfaces of BL upper extremities distal to elbows, with less prominent lessions extending to palms and flexor surfaces. Similar lesions present on buttocks bilaterally, and on LE bilaterally below the knee. All lesions have a central papular area that appear wart-like, and approximately 2-43mm in diameter, and all are surrounded by erythema that extends to variable sizes, typically from 0.5-1.5cm. One such lesion on the lateral left ankle has associated central purulence and drainage.  Psychiatric:  Patient is noticeably anxious, with rapid, pressured speech.    Lab results: Basic Metabolic Panel:  Basename 05/30/12 1336  NA 138  K 4.1  CL 109  CO2 --  GLUCOSE 109*  BUN 13  CREATININE 0.90  CALCIUM --  MG --  PHOS --   CBC:  Basename 05/30/12 1336 05/30/12 1305  WBC -- 20.1*  NEUTROABS -- --  HGB 16.0* 15.4*  HCT 47.0* 44.5  MCV -- 87.4  PLT -- 322   Urine Drug Screen: Drugs of Abuse     Component Value Date/Time   LABOPIA POSITIVE* 05/30/2012 1349   COCAINSCRNUR NONE DETECTED 05/30/2012 1349   LABBENZ POSITIVE* 05/30/2012  1349   AMPHETMU NONE DETECTED 05/30/2012 1349   THCU NONE DETECTED 05/30/2012 1349   LABBARB NONE DETECTED 05/30/2012 1349    Assessment & Plan by Problem: 1. Skin rash/lesions -  The skin lesions that she currently has look as if they are pruritic, although the patient denies this. On preliminary evaluation, they look as if they have been scratched extensively, resulting in possible infection at a few such sites.  There is clear infection/purulence in the lesion on her lateral left ankle that was previously incised and drained  in the ED. The patient had a leukocytosis and was tachycardic on presentation.  Interventions at this time will be directed towards determining the etiology of the lesions, and for assessing potential serious complications (namely infectious) that might be associated with the lesions. To note, the patient has a history of positive MRSA PCR screening. - admit to floor - begin IV vancomycin per pharmacy - monitor vitals - blood culture x 2 - check CBC with diff - check ESR and CRP  2. Systemic lupus erythematosus - the patient states that she has a PMH of SLE. Although she does have a positive ANA on record from 2 years ago, she has a negative ANA from 1 year ago, and also has a negative dsDNA from 1 year ago. Given the negative dsDNA, it is highly unlikely that the patient truly has SLE. She has been receiving prednisone from an outside health clinic, and has many past ED visits for what was thought to be SLE-associated pain, for which she was often given dilaudid or morphine. Interventions at this time will be aimed at further assessing for the possible presence of SLE and other closely-related autoimmune diseases in this patient. - check ANA - check dsDNA - check rheumatoid factor - check C3 and C4  Signed: Karlie Aung R 05/30/2012, 6:45 PM

## 2012-05-30 NOTE — ED Notes (Signed)
Met with pt for supportive visit.  Emotional support offered.

## 2012-05-30 NOTE — ED Notes (Signed)
Seen yesterday for lesions related to lupus infected on left ankle. Then when dischraged had numerous other lesions erupt and complains of pain, has taken all percoet given but three

## 2012-05-30 NOTE — ED Provider Notes (Signed)
History     CSN: [id number]  Arrival date & time 05/30/12  0802   First MD Initiated Contact with Patient 05/30/12 [phone]      Chief Complaint  Patient presents with  . Rash    (Consider location/radiation/quality/duration/timing/severity/associated sxs/prior treatment) HPI Patient suffers from painful rash diffusely on trunk and extremities for 3 years. Has become worse over approximately the past 3 weeks. Patient states is secondary to lupus . Pain is burning in quality and diffuse. Patient seen in the emergency department yesterday had abscessed I&D from left ankle. Given prescription for Percocet of which she's used 7 of 10 prescribed. She also has increased her dose of prednisone, taking 40 mg last night. Patient feels as if she's having a lupus flareup. Past Medical History  Diagnosis Date  . Depression   . Chronic pain   . Leukocytoclastic vasculitis   . Tobacco abuse   . Lupus   . Lupus     Past Surgical History  Procedure Date  . Cholecystectomy   . Tubal ligation   . Ankle surgery     No family history on file.  History  Substance Use Topics  . Smoking status: Current Everyday Smoker -- 1.0 packs/day    Types: Cigarettes  . Smokeless tobacco: Never Used  . Alcohol Use: No    OB History    Grav Para Term Preterm Abortions TAB SAB Ect Mult Living                  Review of Systems  Constitutional: Negative.   HENT: Negative.   Respiratory: Negative.   Cardiovascular: Negative.   Gastrointestinal: Negative.   Musculoskeletal: Negative.   Skin: Positive for rash.  Neurological: Negative.   Hematological: Negative.   Psychiatric/Behavioral: Negative.     Allergies  Review of patient's allergies indicates no known allergies.  Home Medications   Current Outpatient Rx  Name Route Sig Dispense Refill  . ALPRAZOLAM 0.5 MG PO TABS Oral Take 0.5 mg by mouth at bedtime as needed. Patient uses this medication for anxiety.    . CLONAZEPAM 0.5 MG PO TABS  Oral Take 0.5 mg by mouth at bedtime.    Marland Kitchen ADVIL PM PO Oral Take 1 tablet by mouth at bedtime as needed. For pain and sleep    . OXYCODONE-ACETAMINOPHEN 5-325 MG PO TABS Oral Take 1 tablet by mouth every 4 (four) hours as needed for pain. 10 tablet 0  . PREDNISONE 10 MG PO TABS Oral Take 2 tablets (20 mg total) by mouth 2 (two) times daily. 20 tablet 0  . SULFAMETHOXAZOLE-TRIMETHOPRIM 800-160 MG PO TABS Oral Take 1 tablet by mouth 2 (two) times daily. 14 tablet 0  . CLONAZEPAM 0.5 MG PO TABS Oral Take 1 tablet (0.5 mg total) by mouth 2 (two) times daily. 60 tablet 0    BP 157/109  Pulse 124  Temp 97.4 F (36.3 C) (Oral)  Resp 22  SpO2 97%  Physical Exam  Nursing note and vitals reviewed. Constitutional: She appears well-developed and well-nourished.       Appears uncomfortable  HENT:  Head: Normocephalic and atraumatic.  Eyes: Conjunctivae are normal. Pupils are equal, round, and reactive to light.  Neck: Neck supple. No tracheal deviation present. No thyromegaly present.  Cardiovascular: Normal rate and regular rhythm.   No murmur heard. Pulmonary/Chest: Effort normal and breath sounds normal.       Obese  Abdominal: Soft. Bowel sounds are normal. She exhibits no distension. There is  no tenderness.  Musculoskeletal: Normal range of motion. She exhibits no edema and no tenderness.  Neurological: She is alert. Coordination normal.  Skin: Skin is warm and dry. Rash noted.       Multiple 2-3 mm nodules on trunk and extremities And buttock sLeft lower extremity lateral malleolus has adime sized open wound packed with iodoform gauze. Mildly tender no red streaks up the leg. All 4 extremities neurovascularly intact  Psychiatric:       Tearful    ED Course  Procedures (including critical care time)  Labs Reviewed - No data to display No results found.   No diagnosis found.  Patient given Percocet and dialudid  In the ED packing removed from left ankle wound to reveal a clean  base  MDM     Patient suffers from chronic pain, she is instructed to increase prednisone and taper prednisone to 40 mg x2 days, 30 mg x2 days 20 mg x2 days and then 10 mg daily. Patient reports she has adequate prednisone at home . Marland Kitchen Plan prescription Percocet dispense 10 tablets Diagnosis #1chronic pain #2 healing abscess     Doug Sou, MD 05/30/12 1005

## 2012-05-30 NOTE — Progress Notes (Signed)
ANTIBIOTIC CONSULT NOTE - INITIAL  Pharmacy Consult for Vancomycin Indication: cellulitis/skin lesions  No Known Allergies  Patient Measurements: Height: 5' 2.99" (160 cm) Weight: 199 lb 15.3 oz (90.7 kg) IBW/kg (Calculated) : 52.38    Vital Signs: Temp: 97 F (36.1 C) (07/12 1411) Temp src: Oral (07/12 1411) BP: 132/93 mmHg (07/12 1411) Pulse Rate: 108  (07/12 1411) Intake/Output from previous day:   Intake/Output from this shift:    Labs:  Basename 05/30/12 1336 05/30/12 1305  WBC -- 20.1*  HGB 16.0* 15.4*  PLT -- 322  LABCREA -- --  CREATININE 0.90 --   Estimated Creatinine Clearance: 87 ml/min (by C-G formula based on Cr of 0.9). No results found for this basename: VANCOTROUGH:2,VANCOPEAK:2,VANCORANDOM:2,GENTTROUGH:2,GENTPEAK:2,GENTRANDOM:2,TOBRATROUGH:2,TOBRAPEAK:2,TOBRARND:2,AMIKACINPEAK:2,AMIKACINTROU:2,AMIKACIN:2, in the last 72 hours   Microbiology: No results found for this or any previous visit (from the past 720 hour(s)).  Medical History: Past Medical History  Diagnosis Date  . Depression   . Chronic pain   . Leukocytoclastic vasculitis   . Tobacco abuse   . Lupus   . Lupus     Medications:   (Not in a hospital admission) Assessment: 41 y/o female patient admitted for skin lesions/rash which may be MRSA requiring vancomycin. Afebrile with leukocystosis on admit, renal fxn wnl.  Goal of Therapy:  Vancomycin trough level 10-15 mcg/ml  Plan:  Vancomycin 1250mg  IV q12h, and monitor renal function. Measure antibiotic drug levels at steady [address] PharmD, [place] Pager [phone] 05/30/2012,6:16 PM

## 2012-05-30 NOTE — ED Notes (Signed)
Admitting MD paged 

## 2012-05-30 NOTE — ED Notes (Signed)
Pt. States, "want my pain managed." unrelieved from today's earlier evaluation. Pt. Has lupus. Pt. Has prescription for percocet but not helping. Pt. States, "I come here and receive 3 to 4 shots of dilaudid and sent home." she does have prednisone.

## 2012-05-30 NOTE — ED Provider Notes (Signed)
History     CSN: [id number]  Arrival date & time 05/30/12  1131   First MD Initiated Contact with Patient 05/30/12 1255      Chief Complaint  Patient presents with  . Muscle Pain    (Consider location/radiation/quality/duration/timing/severity/associated sxs/prior treatment) HPI Patient presents for the second time today with pain about her buttocks and extremities and trunk. Worse over the past 3 weeks. She receivedDilaudid are muscularly during her visit earlier today and was prescribed Percocet. Denies adequate pain relief. No fever no other complaint no other associated symptoms. Past Medical History  Diagnosis Date  . Depression   . Chronic pain   . Leukocytoclastic vasculitis   . Tobacco abuse   . Lupus   . Lupus     Past Surgical History  Procedure Date  . Cholecystectomy   . Tubal ligation   . Ankle surgery     No family history on file.  History  Substance Use Topics  . Smoking status: Current Everyday Smoker -- 1.0 packs/day    Types: Cigarettes  . Smokeless tobacco: Never Used  . Alcohol Use: No   Former user of crack cocaine OB History    Grav Para Term Preterm Abortions TAB SAB Ect Mult Living                  Review of Systems  Constitutional: Negative.   HENT: Negative.   Respiratory: Negative.   Cardiovascular: Negative.   Gastrointestinal: Negative.   Skin: Positive for rash and wound.       Healing abscess left ankle  Neurological: Negative.   Hematological: Negative.   Psychiatric/Behavioral: Negative.     Allergies  Review of patient's allergies indicates no known allergies.  Home Medications   Current Outpatient Rx  Name Route Sig Dispense Refill  . ALPRAZOLAM 0.5 MG PO TABS Oral Take 0.5 mg by mouth at bedtime as needed. Patient uses this medication for anxiety.    . CLONAZEPAM 0.5 MG PO TABS Oral Take 0.5 mg by mouth at bedtime.    . OXYCODONE-ACETAMINOPHEN 5-325 MG PO TABS Oral Take 1 tablet by mouth every 4 (four) hours  as needed for pain. 10 tablet 0  . PREDNISONE 10 MG PO TABS Oral Take 2 tablets (20 mg total) by mouth 2 (two) times daily. 20 tablet 0    BP 147/106  Pulse 115  Temp 98 F (36.7 C) (Oral)  Resp 26  SpO2 97%  Physical Exam  Nursing note and vitals reviewed. Constitutional: She appears well-developed and well-nourished. She appears distressed.       Appears uncomfortable  HENT:  Head: Normocephalic and atraumatic.  Eyes: Conjunctivae are normal. Pupils are equal, round, and reactive to light.  Neck: Neck supple. No tracheal deviation present. No thyromegaly present.  Cardiovascular: Normal rate and regular rhythm.   No murmur heard. Pulmonary/Chest: Effort normal and breath sounds normal.  Abdominal: Soft. Bowel sounds are normal. She exhibits no distension. There is no tenderness.  Musculoskeletal: Normal range of motion. She exhibits no edema and no tenderness.  Neurological: She is alert. Coordination normal.  Skin: Skin is warm and dry. Rash noted.       Multiple 2-3 mm nodules on trunk and extremities and buttocks. Left lateral malleolus is a dime size open wound, clean appearing all 4 extremities neurovascularly intact  Psychiatric:       Tearful and anxious    ED Course  Procedures (including critical care time)  Labs Reviewed - No  data to display No results found.   No diagnosis found.  3:15 PM pain improved after treatment with intravenousDialudid, however patient requesting more pain medicine due to burning of her skin Results for orders placed during the hospital encounter of 05/30/12  URINE RAPID DRUG SCREEN (HOSP PERFORMED)      Component Value Range   Opiates POSITIVE (*) NONE DETECTED   Cocaine NONE DETECTED  NONE DETECTED   Benzodiazepines POSITIVE (*) NONE DETECTED   Amphetamines NONE DETECTED  NONE DETECTED   Tetrahydrocannabinol NONE DETECTED  NONE DETECTED   Barbiturates NONE DETECTED  NONE DETECTED  CBC      Component Value Range   WBC 20.1 (*)  4.0 - 10.5 K/uL   RBC 5.09  3.87 - 5.11 MIL/uL   Hemoglobin 15.4 (*) 12.0 - 15.0 g/dL   HCT 78.2  95.6 - 21.3 %   MCV 87.4  78.0 - 100.0 fL   MCH 30.3  26.0 - 34.0 pg   MCHC 34.6  30.0 - 36.0 g/dL   RDW 08.6  57.8 - 46.9 %   Platelets 322  150 - 400 K/uL  POCT I-STAT, CHEM 8      Component Value Range   Sodium 138  135 - 145 mEq/L   Potassium 4.1  3.5 - 5.1 mEq/L   Chloride 109  96 - 112 mEq/L   BUN 13  6 - 23 mg/dL   Creatinine, Ser 6.29  0.50 - 1.10 mg/dL   Glucose, Bld 528 (*) 70 - 99 mg/dL   Calcium, Ion 4.13  2.44 - 1.23 mmol/L   TCO2 18  0 - 100 mmol/L   Hemoglobin 16.0 (*) 12.0 - 15.0 g/dL   HCT 01.0 (*) 27.2 - 53.6 %   No results found.   MDM   patient having intractable pain Requires admission for pain control Leukocytosis likely secondary to stress or steroids Spoke with Dr.Ho who will arrange for admission Diagnosis intractable pain        Doug Sou, MD 05/30/12 1733

## 2012-05-31 ENCOUNTER — Encounter (HOSPITAL_COMMUNITY): Payer: Self-pay

## 2012-05-31 LAB — BASIC METABOLIC PANEL
BUN: 14 mg/dL (ref 6–23)
CO2: 22 mEq/L (ref 19–32)
Calcium: 9.3 mg/dL (ref 8.4–10.5)
Creatinine, Ser: 0.63 mg/dL (ref 0.50–1.10)

## 2012-05-31 LAB — URINALYSIS, ROUTINE W REFLEX MICROSCOPIC
Bilirubin Urine: NEGATIVE
Hgb urine dipstick: NEGATIVE
Protein, ur: NEGATIVE mg/dL
Urobilinogen, UA: 0.2 mg/dL (ref 0.0–1.0)

## 2012-05-31 LAB — CBC
MCHC: 33.4 g/dL (ref 30.0–36.0)
MCV: 88.5 fL (ref 78.0–100.0)
Platelets: 302 10*3/uL (ref 150–400)
RDW: 15.3 % (ref 11.5–15.5)
WBC: 11.6 10*3/uL — ABNORMAL HIGH (ref 4.0–10.5)

## 2012-05-31 LAB — HIV ANTIBODY (ROUTINE TESTING W REFLEX): HIV: NONREACTIVE

## 2012-05-31 LAB — SEDIMENTATION RATE: Sed Rate: 18 mm/hr (ref 0–22)

## 2012-05-31 LAB — URINE MICROSCOPIC-ADD ON

## 2012-05-31 MED ORDER — MORPHINE SULFATE 2 MG/ML IJ SOLN
1.0000 mg | INTRAMUSCULAR | Status: DC | PRN
  Administered 2012-05-31 – 2012-06-01 (×4): 1 mg via INTRAVENOUS
  Filled 2012-05-31 (×4): qty 1

## 2012-05-31 MED ORDER — DEXTROSE 5 % IV SOLN
1.0000 g | INTRAVENOUS | Status: DC
  Administered 2012-05-31 – 2012-06-02 (×3): 1 g via INTRAVENOUS
  Filled 2012-05-31 (×4): qty 10

## 2012-05-31 NOTE — H&P (Signed)
Internal Medicine Attending Admission Note Date: 05/31/2012  Patient name: [name] Medical record number: [id number] Date of birth: 1971/03/10 Age: 41 y.o. Gender: female  I saw and evaluated the patient. I reviewed the resident's note and I agree with the resident's findings and plan as documented in the resident's note.  Chief Complaint(s): Painful and burning skin lesions all over.  History - key components related to admission:  [name] is a 41 year old woman who carries a diagnosis of systemic lupus erythematosus for approximately 4 years and has had a leukocytoclastic vasculitis associated rash nearly yearly during that time. When this painful rash occurs she has been treated with prednisone and Dilantin or morphine. She recently developed a lesion on the lateral aspect of her left malleolus. In the emergency department this lesion was incised and drained. She subsequently developed a diffuse rash over her entire body although her upper abdomen, breasts, and face are spared. Because of the severe burning pain associated with this rash she presented to the emergency department and was admitted for further care. She denies any fevers, chills, nausea, vomiting, shortness of breath, chest pain, abdominal pain, diarrhea, or hematuria. Although she has a history of intermittent joint pain she is without any at this time. She is sexually active with her partner of several years although the relationship has been on and off and it is unclear if he has been monogamous during this time. She denies any vaginal discharge, pain, or oder. She apparently has a rheumatology appointment at [facility] scheduled for the near future. She is without other complaints at this time.  Physical Exam - key components related to admission:  Filed Vitals:   05/30/12 1426 05/30/12 2007 05/30/12 2215 05/31/12 0451  BP:  133/86 130/86 126/65  Pulse:  126 130 101  Temp:  97.5 F (36.4 C) 98.1 F (36.7 C) 97.9  F (36.6 C)  TempSrc:  Oral Oral Oral  Resp:  16 18 18   Height: 5' 2.99" (1.6 m)  5\' 3"  (1.6 m)   Weight: 199 lb 15.3 oz (90.7 kg)  207 lb 3.7 oz (94 kg)   SpO2:  98% 97% 99%   Gen.: Well-developed well-nourished woman sitting in bed with mild anxiety and pressured speech. She is occasionally tearful but at other times calm and conversive. Skin: Pustular lesions throughout the body approximately 0.5-1 cm in diameter. The lesions are in various stages. They appear to begin as pustules with surrounding erythema that have central necrosis and eventually denudation. These lesions seem to concentrate on her buttocks but are also present on her legs, feet, arms, hands, palms, fingertips, back, axilla, and right ear. There are no pustules or denuded lesions on the lips or oropharynx although she has a clean base ulcer on the left lateral side of her tongue. There are also no lesions on her face, breasts or soles. Lungs: Clear to auscultation bilaterally without wheezes, rhonchi, or rales. Heart: Regular rate and rhythm without murmurs, rubs, or gallops. Abdomen: Soft, nontender, active bowel sounds Extremities: Without edema.  No erythema or warmth over her previous incisions related to the hardware implanted in both feet.  Lab results:  Basic Metabolic Panel:  Basename 05/31/12 0630 05/30/12 1336  NA 135 138  K 4.6 4.1  CL 103 109  CO2 22 --  GLUCOSE 132* 109*  BUN 14 13  CREATININE 0.63 0.90  CALCIUM 9.3 --  MG -- --  PHOS -- --   CBC:  Basename 05/31/12 0630 05/30/12  2226  WBC 11.6* 15.1*  NEUTROABS -- 11.3*  HGB 13.6 13.2  HCT 40.7 38.8  MCV 88.5 88.2  PLT 302 243   Urine Drug Screen:  Positive for opiates and benzodiazepines. Negative for cocaine.  Urinalysis:  15 ketones, small leukocytes, 0-2 white blood cells per high-power field  Misc. Labs:  HIV nonreactive C. reactive protein 7.6 (H)  ESR 18 C4 complement 19  Assessment & Plan by Problem:  [name] is a  41 year old woman who carries a diagnosis of systemic lupus erythromatosis who presents with recurrent diffuse pustular rash which is painful and necrotic. The differential diagnosis includes disseminated gonococcal infection, an autoimmune process, or a bacterial endocarditis with septic embolization. I agree with the housestaff"s current working diagnosis of disseminated gonococcal infection given the appearance of the rash.  1) Diffuse rash: Given the necrotic nature of the rash we will put treat her pain with narcotic therapy. Cultures for gonococcal organisms of a pustule and blood cultures have been obtained. She was empirically started on vancomycin given her initial presentation but ceftriaxone will be added given our concerns for a disseminated gonococcal infection. I agree with the rheumatologic evaluation to assess for an autoimmune process. The likelihood of a septic endocarditis in a patient who otherwise appears so well is very unlikely given the extent of the rash. Nonetheless blood cultures are pending. If the patient is found to have an infectious process further evaluation of her hardware in her bilateral feet and ankles and below her left eye may be required.  Other evaluation and disposition is as pending the results of our initial workup. This may include another biopsy of a lesion specifically looking for gonococcal or other organisms.

## 2012-05-31 NOTE — Progress Notes (Signed)
ANTIBIOTIC CONSULT NOTE - INITIAL  Pharmacy Consult for ceftriaxone Indication: ?disseminated gonorrhea  No Known Allergies  Patient Measurements: Height: 5\' 3"  (160 cm) Weight: 207 lb 3.7 oz (94 kg) (bed scale) IBW/kg (Calculated) : 52.4   Vital Signs: Temp: 97.9 F (36.6 C) (07/13 0451) Temp src: Oral (07/13 0451) BP: 126/65 mmHg (07/13 0451) Pulse Rate: 101  (07/13 0451) Intake/Output from previous day:   Intake/Output from this shift: Total I/O In: -  Out: 250 [Urine:250]  Labs:  [facility] 05/31/12 0630 05/30/12 2226 05/30/12 1336 05/30/12 1305  WBC 11.6* 15.1* -- 20.1*  HGB 13.6 13.2 16.0* --  PLT 302 243 -- 322  LABCREA -- -- -- --  CREATININE 0.63 -- 0.90 --   Estimated Creatinine Clearance: 99.8 ml/min (by C-G formula based on Cr of 0.63). No results found for this basename: VANCOTROUGH:2,VANCOPEAK:2,VANCORANDOM:2,GENTTROUGH:2,GENTPEAK:2,GENTRANDOM:2,TOBRATROUGH:2,TOBRAPEAK:2,TOBRARND:2,AMIKACINPEAK:2,AMIKACINTROU:2,AMIKACIN:2, in the last 72 hours   Microbiology: No results found for this or any previous visit (from the past 720 hour(s)).  Medical History: Past Medical History  Diagnosis Date  . Depression   . Chronic pain   . Leukocytoclastic vasculitis   . Tobacco abuse   . Lupus   . Lupus    Assessment: 1 yof presented to the ED with skin lesions/rash initiated on vancomycin for possible MRSA infection. Now adding ceftriaxone per pharmacy for possible disseminated gonorrhea. Recommend to treat for 7 days and de-escalate if improvement at 24-48 hours.   Plan:  1. Ceftriaxone 1gm IV Q24H 2. F/u clinical status and de-escalate as able 3. Recommend completing a 7-day course  Sydne Krahl, Drake Leach 05/31/2012,9:30 AM
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
## 2012-06-01 DIAGNOSIS — F329 Major depressive disorder, single episode, unspecified: Secondary | ICD-10-CM

## 2012-06-01 DIAGNOSIS — F411 Generalized anxiety disorder: Secondary | ICD-10-CM

## 2012-06-01 LAB — VANCOMYCIN, TROUGH: Vancomycin Tr: 11.1 ug/mL (ref 10.0–20.0)

## 2012-06-01 MED ORDER — OXYCODONE-ACETAMINOPHEN 5-325 MG PO TABS
1.0000 | ORAL_TABLET | ORAL | Status: DC | PRN
  Administered 2012-06-01: 1 via ORAL
  Administered 2012-06-01 – 2012-06-02 (×4): 2 via ORAL
  Filled 2012-06-01 (×5): qty 2

## 2012-06-01 MED ORDER — CLONAZEPAM 0.5 MG PO TABS
0.5000 mg | ORAL_TABLET | Freq: Every day | ORAL | Status: DC
  Administered 2012-06-01 – 2012-06-03 (×3): 0.5 mg via ORAL
  Filled 2012-06-01 (×3): qty 1

## 2012-06-01 NOTE — Progress Notes (Signed)
ANTIBIOTIC CONSULT NOTE - Follow Up  Pharmacy Consult for Vancomycin Indication: cellulitis/skin lesions  No Known Allergies  Patient Measurements: Height: 5\' 3"  (160 cm) Weight: 207 lb 3.7 oz (94 kg) (bed scale) IBW/kg (Calculated) : 52.4    Vital Signs: Temp: 98 F (36.7 C) (07/14 1510) Temp src: Oral (07/14 1510) BP: 106/56 mmHg (07/14 1510) Pulse Rate: 88  (07/14 1510) Intake/Output from previous day: 07/13 0701 - 07/14 0700 In: 1270 [P.O.:720; IV Piggyback:550] Out: 250 [Urine:250] Intake/Output from this shift:    Labs:  Basename 05/31/12 0630 05/30/12 2226 05/30/12 1336 05/30/12 1305  WBC 11.6* 15.1* -- 20.1*  HGB 13.6 13.2 16.0* --  PLT 302 243 -- 322  LABCREA -- -- -- --  CREATININE 0.63 -- 0.90 --   Estimated Creatinine Clearance: 99.8 ml/min (by C-G formula based on Cr of 0.63).  Basename 06/01/12 1809  VANCOTROUGH 11.1  VANCOPEAK --  VANCORANDOM --  GENTTROUGH --  GENTPEAK --  GENTRANDOM --  TOBRATROUGH --  TOBRAPEAK --  TOBRARND --  AMIKACINPEAK --  AMIKACINTROU --  AMIKACIN --     Microbiology: Recent Results (from the past 720 hour(s))  CULTURE, BLOOD (ROUTINE X 2)     Status: Normal (Preliminary result)   Collection Time   05/30/12  6:00 PM      Component Value Range Status Comment   Specimen Description BLOOD ARM RIGHT   Final    Special Requests BOTTLES DRAWN AEROBIC AND ANAEROBIC 10CC EA   Final    Culture  Setup Time 05/30/2012 22:06   Final    Culture     Final    Value:        BLOOD CULTURE RECEIVED NO GROWTH TO DATE CULTURE WILL BE HELD FOR 5 DAYS BEFORE ISSUING A FINAL NEGATIVE REPORT   Report Status PENDING   Incomplete   CULTURE, BLOOD (ROUTINE X 2)     Status: Normal (Preliminary result)   Collection Time   05/30/12  6:07 PM      Component Value Range Status Comment   Specimen Description BLOOD HAND RIGHT   Final    Special Requests BOTTLES DRAWN AEROBIC AND ANAEROBIC 10CC EA   Final    Culture  Setup Time 05/30/2012 22:07    Final    Culture     Final    Value:        BLOOD CULTURE RECEIVED NO GROWTH TO DATE CULTURE WILL BE HELD FOR 5 DAYS BEFORE ISSUING A FINAL NEGATIVE REPORT   Report Status PENDING   Incomplete   WOUND CULTURE     Status: Normal (Preliminary result)   Collection Time   05/30/12 11:31 PM      Component Value Range Status Comment   Specimen Description WOUND RIGHT HAND   Final    Special Requests NONE   Final    Gram Stain PENDING   Incomplete    Culture NO GROWTH 1 DAY   Final    Report Status PENDING   Incomplete   WOUND CULTURE     Status: Normal (Preliminary result)   Collection Time   05/30/12 11:31 PM      Component Value Range Status Comment   Specimen Description WOUND RIGHT ARM   Final    Special Requests NONE   Final    Gram Stain PENDING   Incomplete    Culture     Final    Value: FEW STAPHYLOCOCCUS AUREUS     Note: RIFAMPIN AND  GENTAMICIN SHOULD NOT BE USED AS SINGLE DRUGS FOR TREATMENT OF STAPH INFECTIONS.   Report Status PENDING   Incomplete   WOUND CULTURE     Status: Normal (Preliminary result)   Collection Time   05/30/12 11:31 PM      Component Value Range Status Comment   Specimen Description WOUND LEFT ANKLE   Final    Special Requests NONE   Final    Gram Stain PENDING   Incomplete    Culture     Final    Value: MODERATE STAPHYLOCOCCUS AUREUS     Note: RIFAMPIN AND GENTAMICIN SHOULD NOT BE USED AS SINGLE DRUGS FOR TREATMENT OF STAPH INFECTIONS.   Report Status PENDING   Incomplete     Medical History: Past Medical History  Diagnosis Date  . Depression   . Chronic pain   . Leukocytoclastic vasculitis   . Tobacco abuse   . Lupus   . Lupus     Medications:  Prescriptions prior to admission  Medication Sig Dispense Refill  . ALPRAZolam (XANAX) 0.5 MG tablet Take 0.5 mg by mouth at bedtime as needed. Patient uses this medication for anxiety.      . clonazePAM (KLONOPIN) 0.5 MG tablet Take 0.5 mg by mouth at bedtime.      Marland Kitchen oxyCODONE-acetaminophen  (PERCOCET) 5-325 MG per tablet Take 1 tablet by mouth every 4 (four) hours as needed for pain.  10 tablet  0  . predniSONE (DELTASONE) 10 MG tablet Take 2 tablets (20 mg total) by mouth 2 (two) times daily.  20 tablet  0   Assessment: 41 y/o female patient admitted for skin lesions/rash/ cellulitis - wound on Left ankle grew MRSA currently being treated with vancomycin. Bcx ngtd, no notes regarding osteomyelitis, ? Endocarditis - no ECHO, no murmur per exams - Vancomycin trough 11.1 at goal for cellulitis.   Afebrile with leukocystosis on admit improving 15>11, renal fxn wnl.  Goal of Therapy:  Vancomycin trough level 10-15 mcg/ml  Plan:  Continue Vancomycin 1250mg  IV q12h Leota Sauers Pharm.D. CPP, BCPS Clinical Pharmacist [phone] 06/01/2012 7:19 PM
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
## 2012-06-02 DIAGNOSIS — L989 Disorder of the skin and subcutaneous tissue, unspecified: Secondary | ICD-10-CM

## 2012-06-02 DIAGNOSIS — D72829 Elevated white blood cell count, unspecified: Secondary | ICD-10-CM

## 2012-06-02 DIAGNOSIS — R21 Rash and other nonspecific skin eruption: Secondary | ICD-10-CM | POA: Diagnosis present

## 2012-06-02 LAB — HEPATITIS PANEL, ACUTE
Hep A IgM: NEGATIVE
Hep B C IgM: NEGATIVE

## 2012-06-02 LAB — ANA: Anti Nuclear Antibody(ANA): NEGATIVE

## 2012-06-02 LAB — GC/CHLAMYDIA PROBE AMP, URINE: GC Probe Amp, Urine: NEGATIVE

## 2012-06-02 LAB — MRSA PCR SCREENING: MRSA by PCR: NEGATIVE

## 2012-06-02 LAB — WOUND CULTURE

## 2012-06-02 LAB — C4 COMPLEMENT: Complement C4, Body Fluid: 19 mg/dL (ref 10–40)

## 2012-06-02 LAB — C3 COMPLEMENT: C3 Complement: 144 mg/dL (ref 90–180)

## 2012-06-02 MED ORDER — CEPHALEXIN 500 MG PO CAPS
500.0000 mg | ORAL_CAPSULE | Freq: Four times a day (QID) | ORAL | Status: DC
  Administered 2012-06-02 – 2012-06-03 (×5): 500 mg via ORAL
  Filled 2012-06-02 (×8): qty 1

## 2012-06-02 MED ORDER — NICOTINE 21 MG/24HR TD PT24
21.0000 mg | MEDICATED_PATCH | Freq: Every day | TRANSDERMAL | Status: DC
  Administered 2012-06-02 – 2012-06-03 (×2): 21 mg via TRANSDERMAL
  Filled 2012-06-02 (×2): qty 1

## 2012-06-02 MED ORDER — OXYCODONE-ACETAMINOPHEN 5-325 MG PO TABS
1.0000 | ORAL_TABLET | ORAL | Status: DC | PRN
  Administered 2012-06-02 – 2012-06-03 (×5): 1 via ORAL
  Filled 2012-06-02 (×5): qty 1

## 2012-06-02 MED ORDER — ALPRAZOLAM 0.5 MG PO TABS
0.5000 mg | ORAL_TABLET | Freq: Two times a day (BID) | ORAL | Status: DC | PRN
  Administered 2012-06-02 – 2012-06-03 (×3): 0.5 mg via ORAL
  Filled 2012-06-02 (×3): qty 1

## 2012-06-02 NOTE — Progress Notes (Signed)
[facility]  Total days of antibiotics 5        Day 3 Ceftriaxone        Day 4 Vancomycin       Reason for Consult: Evaluation and treatment of diffuse pustular rash    Referring Physician: Dr. Doneen Poisson  Principal Problem:  *Acute skin eruption of discoloration, elevations, blisters Active Problems:  Chronic pain  Leukocytosis     . cephALEXin  500 mg Oral Q6H  . clonazePAM  0.5 mg Oral Daily  . heparin  5,000 Units Subcutaneous Q8H  . ketorolac  10 mg Oral Q6H  . nicotine  21 mg Transdermal Daily  . predniSONE  20 mg Oral BID  . DISCONTD: cefTRIAXone (ROCEPHIN)  IV  1 g Intravenous Q24H  . DISCONTD: vancomycin  1,250 mg Intravenous Q12H   Recommendations: 1. D/c contact isolation 2. D/C IV Ceftriaxone and Vancomycin 3. Start Cephelexin PO 500 mg Q6H x4 days 4. Punch biopsy inpatient if possible 5. F/u wound and blood cultures 6. F/u gonococcus DNA pcr  Assessment: [name] was admitted on 05/30/12  with a diffuse non-pruritic blistering rash and joint pains x2 days.  The exact etiology of this rash is unknown, but wound culture of right arm lesion is positive for MSSA.  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  Since her condition appears to be a more chronic presentation with multiple episodes over the past few years, a gonococcal related rash may be lower on the differential list.  Of note, her last known biopsy was in July 2010 of right thigh lesion for evaluation of an infectious etiology for vasculitis that was negative for gram, AFB, and PAS staining.  As such, further evaluation by a dermatologist at this time and another biopsy of the lesions would be helpful to acquire an accurate diagnosis of [name]'s condition; however this seems difficult due to her social factors and lack of insurance.  She  does claim to have an appointment to see a Rhematologist at [facility] in August.  At this point, [name] may have some primary bacterial infected lesion(s) further complicated with this diffuse rash.  We can continue to treat her with abx, but switch to PO Cephelexin instead of IV abx.  Further course of treatment will depend on wound biopsy, culture growths, and gonococcus pcr.    HPI: [name] is a 41 y.o. female presenting with a diffuse blistering rash all over her body and joint pains.  [name] claims the rash erupted on 05/29/12 night after coming to the ED for a lesion on her left malleolus that was I&D in the ED.  She has had multiple similar episodes of this rash in the past, approximately twice a month for the past four years.  The rash is non-pruritic, burning in nature, and diffusely spread all over her body sparing her face, breasts, and abdomen with the exception of a healing lesion near her belly button.  It has improved significantly since admission as per patient and her pain is also well controlled with medication.  She currently denies any itching, headaches, nausea, vomiting, diarrhea, fever, chills, chest pain, shortness of breath, urinary complaints, or abdominal pain.    [name] was extremely anxious when I went to see her and wanted to leave the room to smoke a cigarette to  calm her nerves.  She denies any hx of STI's but does not recall ever being tested for Gonorrhea or Chlamydia.  She is in a monogamous relationship with her boyfriend of 12 years and does not believe him to have any STI's either.  She has a hx of crack cocaine and methadone use, denies IVDA, and has been in jail for 4 months two years ago.    Review of Systems: Pertinent items are noted in HPI.  Past Medical History  Diagnosis Date  . Depression   . Chronic pain   . Leukocytoclastic vasculitis   . Tobacco abuse   . Lupus   . Lupus    History  Substance Use Topics  . Smoking status: Current  Everyday Smoker -- 1.0 packs/day    Types: Cigarettes  . Smokeless tobacco: Never Used  . Alcohol Use: No   History reviewed. No pertinent family history. No Known Allergies  OBJECTIVE: Blood pressure 101/64, pulse 91, temperature 97.9 F (36.6 C), temperature source Oral, resp. rate 17, height 5\' 3"  (1.6 m), weight 207 lb 3.7 oz (94 kg), SpO2 98.00%. General: AAOX3, anxious HEENT: PERRLA, EOMA, + raised lesions on lateral surfaces of tongue Skin: diffuse lesions in multiple stages of healing on all extremities, back, buttocks, and around belly button. Lesions are variable in size, some with necrotic centers, some papules, and some flat. Lungs: CTA B/L Cor: RRR Abdomen: +BS, obese, NT, + stretch marks, + ecchymosis Extremities:+2DP b/l, diffuse rash, -edema  Microbiology: Recent Results (from the past 240 hour(s))  CULTURE, BLOOD (ROUTINE X 2)     Status: Normal (Preliminary result)   Collection Time   05/30/12  6:00 PM      Component Value Range Status Comment   Specimen Description BLOOD ARM RIGHT   Final    Special Requests BOTTLES DRAWN AEROBIC AND ANAEROBIC 10CC EA   Final    Culture  Setup Time 05/30/2012 22:06   Final    Culture     Final    Value:        BLOOD CULTURE RECEIVED NO GROWTH TO DATE CULTURE WILL BE HELD FOR 5 DAYS BEFORE ISSUING A FINAL NEGATIVE REPORT   Report Status PENDING   Incomplete   CULTURE, BLOOD (ROUTINE X 2)     Status: Normal (Preliminary result)   Collection Time   05/30/12  6:07 PM      Component Value Range Status Comment   Specimen Description BLOOD HAND RIGHT   Final    Special Requests BOTTLES DRAWN AEROBIC AND ANAEROBIC 10CC EA   Final    Culture  Setup Time 05/30/2012 22:07   Final    Culture     Final    Value:        BLOOD CULTURE RECEIVED NO GROWTH TO DATE CULTURE WILL BE HELD FOR 5 DAYS BEFORE ISSUING A FINAL NEGATIVE REPORT   Report Status PENDING   Incomplete   WOUND CULTURE     Status: Normal   Collection Time   05/30/12 11:31 PM        Component Value Range Status Comment   Specimen Description WOUND RIGHT HAND   Final    Special Requests NONE   Final    Gram Stain     Final    Value: NO WBC SEEN     NO SQUAMOUS EPITHELIAL CELLS SEEN     NO ORGANISMS SEEN   Culture NO GROWTH 2 DAYS   Final    Report  Status 06/02/2012 FINAL   Final   WOUND CULTURE     Status: Normal   Collection Time   05/30/12 11:31 PM      Component Value Range Status Comment   Specimen Description WOUND RIGHT ARM   Final    Special Requests NONE   Final    Gram Stain     Final    Value: NO WBC SEEN     NO SQUAMOUS EPITHELIAL CELLS SEEN     NO ORGANISMS SEEN   Culture     Final    Value: FEW STAPHYLOCOCCUS AUREUS     Note: RIFAMPIN AND GENTAMICIN SHOULD NOT BE USED AS SINGLE DRUGS FOR TREATMENT OF STAPH INFECTIONS.   Report Status 06/02/2012 FINAL   Final    Organism ID, Bacteria STAPHYLOCOCCUS AUREUS   Final   WOUND CULTURE     Status: Normal (Preliminary result)   Collection Time   05/30/12 11:31 PM      Component Value Range Status Comment   Specimen Description WOUND LEFT ANKLE   Final    Special Requests NONE   Final    Gram Stain PENDING   Incomplete    Culture     Final    Value: MODERATE STAPHYLOCOCCUS AUREUS     Note: RIFAMPIN AND GENTAMICIN SHOULD NOT BE USED AS SINGLE DRUGS FOR TREATMENT OF STAPH INFECTIONS.   Report Status PENDING   Incomplete   MRSA PCR SCREENING     Status: Normal   Collection Time   06/02/12 11:10 AM      Component Value Range Status Comment   MRSA by PCR NEGATIVE  NEGATIVE Final     Darden Palmer, MD PGY-1 IM Resident Eligha Bridegroom. [facility] [phone] pager    06/02/2012, 12:24 PM  Addendum: I have seen and examined Ms. Para March with Dr. Virgina Organ and agree with her assessment and recommendations. 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 It appears to have a primarily vesicular appearance and there are lesions in  multiple stages of evolution. I think that some of the lesions have secondary bacterial superinfection with MSSA and there is also some evidence of chronic excoriation of some of her lesions. I doubt that this is disseminated gonococcal or other systemic infection. I feel comfortable finishing her antibiotic therapy with oral cephalexin. The more difficult thing will be to have better handle on the primary lesion and it may be worthwhile considering inpatient dermatology evaluation and repeat punch biopsy to help guide long-term outpatient management.  Cliffton Asters, MD [facility] [phone] pager   [phone] cell 06/02/2012, 2:33 PM
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
## 2012-06-03 MED ORDER — OXYCODONE-ACETAMINOPHEN 5-325 MG PO TABS: 1.0000 | ORAL_TABLET | Freq: Four times a day (QID) | ORAL | Status: AC | PRN

## 2012-06-03 MED ORDER — CEPHALEXIN 500 MG PO CAPS: 500.0000 mg | ORAL_CAPSULE | Freq: Four times a day (QID) | ORAL | Status: AC
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
## 2012-06-03 NOTE — Progress Notes (Signed)
Discharge teaching completed including follow up appointments and discharge medications.  Patient verbalizes understanding with no further questions.  Discharged to home, vital signs stable.

## 2012-06-03 NOTE — Discharge Summary (Signed)
Patient Name:  [name] MRN: [id number]  PCP: Yisroel Ramming, MD DOB:  27-May-1971       Date of Admission:  05/30/2012  Date of Discharge:  06/03/2012      Attending Physician: Dr. Jonah Blue         DISCHARGE DIAGNOSES: 1. Skin abscesses/lesions 2. Anxiety/depression 3. History of SLE - patient does not have SLE, despite previous records. dsDNA negative.    DISPOSITION AND FOLLOW-UP: [name] is to follow-up with the listed providers as detailed below, at which time, the following should be addressed:   Follow-up Information    Follow up with [facility] on 07/17/2012. (RHEUMATOLOGY - Your appointment is on 07/17/2012 at 8:40AM --> YOU SHOULD ARRIVE BY 8:20AM)    Contact information:   [phone]       Follow up with [facility] on 08/05/2012. (DERMATOLOGY - Your appointment is on 08/05/2012 at 9:20AM --> YOU MUST ARRIVE NO LATER THAN 9:00AM)    Contact information:   [address] [place] [phone]          DISCHARGE MEDICATIONS: Medication List  As of 06/03/2012  8:36 AM   ASK your doctor about these medications         ALPRAZolam 0.5 MG tablet   Commonly known as: XANAX   Take 0.5 mg by mouth at bedtime as needed. Patient uses this medication for anxiety.      clonazePAM 0.5 MG tablet   Commonly known as: KLONOPIN   Take 0.5 mg by mouth at bedtime.      oxyCODONE-acetaminophen 5-325 MG per tablet   Commonly known as: PERCOCET/ROXICET   Take 1 tablet by mouth every 4 (four) hours as needed for pain.      predniSONE 10 MG tablet   Commonly known as: DELTASONE   Take 2 tablets (20 mg total) by mouth 2 (two) times daily.             CONSULTS:  Dr. Orvan Falconer - Infectious Disease   PROCEDURES PERFORMED:  No results found.     ADMISSION DATA: H&P: [name] is a 41 yo woman with PMH of SLE who presents today for pain control. She broke out with a rash all over her body after she left the ED  yesterday after presenting with a similar lesion on her left ankle. She feels like the rash is "on fire" and painful especially on her feet and her buttocks, 9/10 in severity. No exacerbation factor. Dilaudid/Morphine help whenever she comes to the ED. She states the percocet she received in the ED yesterday is not strong enough, and did not relieve her pain. She states she has similar episodes 2-3 times a month, for which she has similarly been treated with either dilaudid or morphine. The patient states that she has not scratched the lesions and that they have not been pruritic. She states that she has scars from previous lesions where they have subsequently healed. She also complains of painful and swollen joints. She states she was first diagnosed with lupus about 3 years ago when her mother passed away. She is being seen at [facility] and states that they are sending her to Rheumatology at [facility]. Denies any fever, chills, N/V, abd pain, SOB, chest pain, or fatigue, blood in urine. She has an increase in urinary frequency over the past 3 years as well.  She takes Prednisone 10mg  daily for these flairs, but reports that only opioids  have given her any real relief. The patient does not have a PCP or rheumatologist that she has seen since developing the disease, and does not appear to have a true diagnosis of SLE that she can relate. She has only sought care for these episodes in the ED and at [facility] (but does not have a PCP there).  Physical Exam: Blood pressure 132/93, pulse 108, temperature 97 F (36.1 C), temperature source Oral, resp. rate 22, height 5' 2.99" (1.6 m), weight 199 lb 15.3 oz (90.7 kg), SpO2 92.00%.  BP 132/93  Pulse 108  Temp 97 F (36.1 C) (Oral)  Resp 22  Ht 5' 2.99" (1.6 m)  Wt 199 lb 15.3 oz (90.7 kg)  BMI 35.43 kg/m2  SpO2 92%  Labs: Basic Metabolic Panel:   Basename  05/30/12 1336   NA  138   K  4.1   CL  109   CO2  --   GLUCOSE  109*   BUN  13     CREATININE  0.90   CALCIUM  --   MG  --   PHOS  --    CBC:   Basename  05/30/12 1336  05/30/12 1305   WBC  --  20.1*   NEUTROABS  --  --   HGB  16.0*  15.4*   HCT  47.0*  44.5   MCV  --  87.4   PLT  --  322    Urine Drug Screen:  Drugs of Abuse    Component  Value  Date/Time    LABOPIA  POSITIVE*  05/30/2012 1349    COCAINSCRNUR  NONE DETECTED  05/30/2012 1349    LABBENZ  POSITIVE*  05/30/2012 1349    AMPHETMU  NONE DETECTED  05/30/2012 1349    THCU  NONE DETECTED  05/30/2012 1349    LABBARB  NONE DETECTED  05/30/2012 1349     HOSPITAL COURSE: 1. Skin rash/lesions - Lesions were pustular on initial presentation, but improved to vesicular and by the time of discharge most were resolved. Started on vancomycin at admission, and rocephin was added due to high clinical suspicion for disseminated gonococcal infection given the appearance of lesions. Serum blood cultures were negative, and cervical PCR for gonorrhea also negative (although not performed until after abx were started).  It is unclear what caused the patient's skin lesions. In the past it was thought to be due to levamisole in cocaine, but patient has been negative for cocaine on every UDS over the last year, denies use over that time, and still has issue with outbreaks of these lesions. 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 Also, myeloperoxidase and proteinase ANCAs were both positive. 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 Sweet's syndrome, for instance, would account for all of her findings except for biopsy revealing leukocytoclastic vasculitis (in Sweet's there is leukocytoclasis without vasculitis). She will be following up with rheumatology at [facility], and also with dermatology. She was discharged with a small supply of hydrocodone for pain management as her lesions continued to resolve.  2.  Anxiety/depression - The patient complained of significant anxiety during her hospital course, and repeatedly requested increased doses of her home xanax. The patient has a history of drug abuse, and also requested IV opioids regularly during her hospital course. It is unclear as to how severe her anxiety is in the context of her history of drug-seeking behavior  and drug overdose. Anxiety appeared to have been resolved at time of discharge.  2. History of systemic lupus erythematosus - patient's dsDNA was negative on previous admission, and was repeated this admission and was again negative. The problem of SLE seemed to have appeared on the patient's problem list secondary to a positive ANA in the past, but as dsDNA is highly sensitive, the patient dose not seem to have SLE.   DISCHARGE DATA: Vital Signs: BP 129/82  Pulse 89  Temp 97.7 F (36.5 C) (Oral)  Resp 18  Ht 5\' 3"  (1.6 m)  Wt 207 lb 3.7 oz (94 kg)  BMI 36.71 kg/m2  SpO2 97%  Labs: Results for orders placed during the hospital encounter of 05/30/12 (from the past 24 hour(s))  MRSA PCR SCREENING     Status: Normal   Collection Time   06/02/12 11:10 AM      Component Value Range   MRSA by PCR NEGATIVE  NEGATIVE  RPR     Status: Normal   Collection Time   06/02/12  3:36 PM      Component Value Range   RPR NON REACTIVE  NON REACTIVE   Signed: Elfredia Nevins, MD   PGY I, Internal Medicine Resident 06/03/2012, 8:36 AM

## 2012-06-04 ENCOUNTER — Encounter (HOSPITAL_COMMUNITY): Payer: Self-pay

## 2012-06-04 ENCOUNTER — Emergency Department (HOSPITAL_COMMUNITY)
Admission: EM | Admit: 2012-06-04 | Discharge: 2012-06-04 | Disposition: A | Discharge status: Home / Self Care | Payer: Self-pay | Attending: Emergency Medicine | Admitting: Emergency Medicine

## 2012-06-04 DIAGNOSIS — R21 Rash and other nonspecific skin eruption: Secondary | ICD-10-CM | POA: Insufficient documentation

## 2012-06-04 DIAGNOSIS — F3289 Other specified depressive episodes: Secondary | ICD-10-CM | POA: Insufficient documentation

## 2012-06-04 DIAGNOSIS — M329 Systemic lupus erythematosus, unspecified: Secondary | ICD-10-CM | POA: Insufficient documentation

## 2012-06-04 DIAGNOSIS — Z9089 Acquired absence of other organs: Secondary | ICD-10-CM | POA: Insufficient documentation

## 2012-06-04 DIAGNOSIS — F329 Major depressive disorder, single episode, unspecified: Secondary | ICD-10-CM | POA: Insufficient documentation

## 2012-06-04 DIAGNOSIS — F17200 Nicotine dependence, unspecified, uncomplicated: Secondary | ICD-10-CM | POA: Insufficient documentation

## 2012-06-04 LAB — MPO/PR-3 (ANCA) ANTIBODIES: Myeloperoxidase Abs: 31 AU/mL — ABNORMAL HIGH (ref <20)

## 2012-06-04 MED ORDER — OXYCODONE-ACETAMINOPHEN 5-325 MG PO TABS: 1.0000 | ORAL_TABLET | Freq: Four times a day (QID) | ORAL | Status: AC | PRN

## 2012-06-04 MED ORDER — HYDROMORPHONE HCL PF 2 MG/ML IJ SOLN
2.0000 mg | Freq: Once | INTRAMUSCULAR | Status: AC
  Administered 2012-06-04: 2 mg via INTRAMUSCULAR
  Filled 2012-06-04: qty 1

## 2012-06-04 MED ORDER — PERMETHRIN 5 % EX CREA: TOPICAL_CREAM | CUTANEOUS | Status: DC

## 2012-06-04 NOTE — ED Notes (Signed)
Pt sts she has been being treated for Lupus but a rash broke out all over her last night again. Joint swelling. Pain all over feels like " I'm burning".

## 2012-06-04 NOTE — ED Provider Notes (Signed)
History     CSN: [id number]  Arrival date & time 06/04/12  0701   First MD Initiated Contact with Patient 06/04/12 [phone]      Chief Complaint  Patient presents with  . Rash     The history is provided by the patient.   the patient has had a rash intermittently for 4 years.  This is a vesicular pustular rash throughout her extremities and trunk.  The patient was recently in the hospital and discharged yesterday after an extensive four-day workup that included multiple lab tests and an infectious disease consultation.  No clear etiology for the rash was found.  The patient's pain was improved and she was discharged.  She returned to the emergency department now with increasing pain and burning from her rash.  She denies animals or pets in the house.  She has had no prior exposure to scabies as she knows of.  She has outpatient rheumatology and dermatology followup scheduled for August and September however she reports the pain was too severe and thus he presents back to the emergency department for evaluation.  She denies fevers and chills.  She denies all other systemic symptoms.  Her pain is moderate to severe and she describes as a burning and painful rash.  She reports the rash has not changed since discharge yesterday  Past Medical History  Diagnosis Date  . Depression   . Chronic pain   . Leukocytoclastic vasculitis   . Tobacco abuse   . Lupus   . Lupus     Past Surgical History  Procedure Date  . Cholecystectomy   . Tubal ligation   . Ankle surgery     No family history on file.  History  Substance Use Topics  . Smoking status: Current Everyday Smoker -- 1.0 packs/day    Types: Cigarettes  . Smokeless tobacco: Never Used  . Alcohol Use: No    OB History    Grav Para Term Preterm Abortions TAB SAB Ect Mult Living                  Review of Systems  Skin: Positive for rash.  All other systems reviewed and are negative.    Allergies  Review of patient's  allergies indicates no known allergies.  Home Medications   Current Outpatient Rx  Name Route Sig Dispense Refill  . ALPRAZOLAM 0.5 MG PO TABS Oral Take 0.5 mg by mouth at bedtime as needed. Patient uses this medication for anxiety.    . CEPHALEXIN 500 MG PO CAPS Oral Take 1 capsule (500 mg total) by mouth every 6 (six) hours. 8 capsule 0  . CLONAZEPAM 0.5 MG PO TABS Oral Take 0.5 mg by mouth at bedtime.    . OXYCODONE-ACETAMINOPHEN 5-325 MG PO TABS Oral Take 1 tablet by mouth every 6 (six) hours as needed. 10 tablet 0  . OXYCODONE-ACETAMINOPHEN 5-325 MG PO TABS Oral Take 1 tablet by mouth every 6 (six) hours as needed for pain. 15 tablet 0  . PERMETHRIN 5 % EX CREA  Apply to entire body, then wash off in 8-14 hours, Then repeat in 1 week. Launder all clothes, towels and linens 60 g 0  . PREDNISONE 10 MG PO TABS Oral Take 2 tablets (20 mg total) by mouth 2 (two) times daily. 20 tablet 0    BP 140/103  Pulse 114  Temp 97 F (36.1 C) (Oral)  Resp 22  SpO2 96%  Physical Exam  Nursing note and vitals  reviewed. Constitutional: She is oriented to person, place, and time. She appears well-developed and well-nourished. No distress.  HENT:  Head: Normocephalic and atraumatic.  Eyes: EOM are normal.  Neck: Normal range of motion.  Cardiovascular: Normal rate, regular rhythm and normal heart sounds.   Pulmonary/Chest: Effort normal and breath sounds normal.  Abdominal: Soft. She exhibits no distension. There is no tenderness.  Musculoskeletal: Normal range of motion.  Neurological: She is alert and oriented to person, place, and time.  Skin: Skin is warm and dry.       Pustular and vesicular rash the route her bilateral arms back and bilateral legs.  There no secondary signs of infection.  The lesions are in multiple stages of healing.  There is no surrounding erythema.  There is no focal abscess  Psychiatric: She has a normal mood and affect. Judgment normal.    ED Course  Procedures  (including critical care time)  Labs Reviewed - No data to display No results found.   1. Rash       MDM  The patient was just in the hospital for 4 days and had an extensive workup and has outpatient rheumatology and dermatology appointment scheduled.  No clear etiology for the rash was noted.  The patient has had a rash intermittently for 4 years.  This is not a life-threatening rash however the etiology of this rash is still unknown and she will require close dermatology followup.        Lyanne Co, MD 06/04/12 [phone]

## 2012-06-05 LAB — CULTURE, BLOOD (ROUTINE X 2)

## 2012-06-13 NOTE — Discharge Summary (Signed)
INTERNAL MEDICINE TEACHING SERVICE Attending Note  Date: 06/13/2012  Patient name: [name]  Medical record number: [id number]  Date of birth: 1971/09/14    This patient has been discussed with the house staff. Please see their note for complete details. I concur with their findings. Please see my note on date of discharge.   Jonah Blue, DO  06/13/2012, 3:58 PM

## 2012-06-19 ENCOUNTER — Emergency Department (HOSPITAL_COMMUNITY)
Admission: EM | Admit: 2012-06-19 | Discharge: 2012-06-19 | Disposition: A | Discharge status: Home / Self Care | Payer: Self-pay | Attending: Emergency Medicine | Admitting: Emergency Medicine

## 2012-06-19 ENCOUNTER — Encounter (HOSPITAL_COMMUNITY): Payer: Self-pay | Admitting: Emergency Medicine

## 2012-06-19 DIAGNOSIS — F17200 Nicotine dependence, unspecified, uncomplicated: Secondary | ICD-10-CM | POA: Insufficient documentation

## 2012-06-19 DIAGNOSIS — L02419 Cutaneous abscess of limb, unspecified: Secondary | ICD-10-CM | POA: Insufficient documentation

## 2012-06-19 DIAGNOSIS — M329 Systemic lupus erythematosus, unspecified: Secondary | ICD-10-CM | POA: Insufficient documentation

## 2012-06-19 DIAGNOSIS — G8929 Other chronic pain: Secondary | ICD-10-CM | POA: Insufficient documentation

## 2012-06-19 MED ORDER — HYDROCODONE-ACETAMINOPHEN 5-325 MG PO TABS: 1.0000 | ORAL_TABLET | ORAL | Status: DC | PRN

## 2012-06-19 MED ORDER — CLINDAMYCIN HCL 300 MG PO CAPS: 300.0000 mg | ORAL_CAPSULE | Freq: Four times a day (QID) | ORAL | Status: AC

## 2012-06-19 NOTE — ED Provider Notes (Signed)
Medical screening examination/treatment/procedure(s) were performed by non-physician practitioner and as supervising physician I was immediately available for consultation/collaboration.   Hernandez Losasso, MD 06/19/12 2309 

## 2012-06-19 NOTE — ED Provider Notes (Signed)
History     CSN: [id number]  Arrival date & time 06/19/12  1308   First MD Initiated Contact with Patient 06/19/12 2000      Chief Complaint  Patient presents with  . Abscess   HPI  History provided by the patient. Patient is a 41 year old female with past history of lupus who presents with complaints of recurrence of abscesses skin infection to left lateral ankle. Patient reports having a small abscess 3-4 weeks ago. She wishes with I&D and antibiotics. Patient reports that her symptoms have improved significantly the sides a slow healing scar from I&D. Over the past 5 days patient reports return of some swelling and erythema to the area. She also reports some purulent drainage from the old I&D wound. Area is tender to palpation and with walking. She denies any fever, chills, sweats. There is no erythematous streak. She has not used any other treatments for symptoms.     Past Medical History  Diagnosis Date  . Depression   . Chronic pain   . Leukocytoclastic vasculitis   . Tobacco abuse   . Lupus   . Lupus     Past Surgical History  Procedure Date  . Cholecystectomy   . Tubal ligation   . Ankle surgery     History reviewed. No pertinent family history.  History  Substance Use Topics  . Smoking status: Current Everyday Smoker -- 1.0 packs/day    Types: Cigarettes  . Smokeless tobacco: Never Used  . Alcohol Use: No    OB History    Grav Para Term Preterm Abortions TAB SAB Ect Mult Living                  Review of Systems  Constitutional: Negative for fever and chills.  Skin:       Abscess and erythema to left ankle  Neurological: Negative for numbness.    Allergies  Review of patient's allergies indicates no known allergies.  Home Medications   Current Outpatient Rx  Name Route Sig Dispense Refill  . ALPRAZOLAM 0.5 MG PO TABS Oral Take 0.5 mg by mouth at bedtime.     Marland Kitchen CLONAZEPAM 0.5 MG PO TABS Oral Take 0.5 mg by mouth at bedtime.    Marland Kitchen PREDNISONE 10  MG PO TABS Oral Take 2 tablets (20 mg total) by mouth 2 (two) times daily. 20 tablet 0    BP 123/89  Pulse 107  Temp 98.1 F (36.7 C) (Oral)  Resp 18  SpO2 96%  Physical Exam  Nursing note and vitals reviewed. Constitutional: She is oriented to person, place, and time. She appears well-developed and well-nourished. No distress.  HENT:  Head: Normocephalic.  Cardiovascular: Normal rate and regular rhythm.   Pulmonary/Chest: Effort normal and breath sounds normal.  Neurological: She is alert and oriented to person, place, and time.  Skin: Skin is warm and dry.       Small 1 cm linear wound to lateral aspect of left ankle malleoli area consistent with history of prior I&D. There is slight purulent drainage and bleeding. There is also surrounding edema and erythema to the skin. Area is tender to palpation. No erythematous streak.  Psychiatric: She has a normal mood and affect. Her behavior is normal.    ED Course  Procedures     1. Cellulitis and abscess of lower leg       MDM  8:03 PM patient seen and evaluated. Patient with erythema slight drainage from lateral aspect of left  ankle and malleolus area.  Prescriptions for clindamycin and Vicodin provided. Patient instructed to have 48 hour recheck.  Angus Seller, PA 06/19/12 2020

## 2012-06-19 NOTE — ED Notes (Signed)
Pt with abscess to right ankle that was drained here and not healing; pt sts drainage from wound

## 2012-07-15 ENCOUNTER — Emergency Department (HOSPITAL_COMMUNITY): Payer: Self-pay

## 2012-07-15 ENCOUNTER — Encounter (HOSPITAL_COMMUNITY): Payer: Self-pay | Admitting: *Deleted

## 2012-07-15 ENCOUNTER — Observation Stay (HOSPITAL_COMMUNITY)
Admission: EM | Admit: 2012-07-15 | Discharge: 2012-07-20 | DRG: 603 | Disposition: A | Discharge status: Home / Self Care | Payer: MEDICAID | Attending: Internal Medicine | Admitting: Internal Medicine

## 2012-07-15 DIAGNOSIS — IMO0002 Reserved for concepts with insufficient information to code with codable children: Secondary | ICD-10-CM | POA: Insufficient documentation

## 2012-07-15 DIAGNOSIS — S91009A Unspecified open wound, unspecified ankle, initial encounter: Secondary | ICD-10-CM

## 2012-07-15 DIAGNOSIS — F329 Major depressive disorder, single episode, unspecified: Secondary | ICD-10-CM

## 2012-07-15 DIAGNOSIS — R451 Restlessness and agitation: Secondary | ICD-10-CM

## 2012-07-15 DIAGNOSIS — S02400A Malar fracture unspecified, initial encounter for closed fracture: Secondary | ICD-10-CM | POA: Insufficient documentation

## 2012-07-15 DIAGNOSIS — M329 Systemic lupus erythematosus, unspecified: Secondary | ICD-10-CM | POA: Insufficient documentation

## 2012-07-15 DIAGNOSIS — L02619 Cutaneous abscess of unspecified foot: Secondary | ICD-10-CM | POA: Diagnosis present

## 2012-07-15 DIAGNOSIS — I10 Essential (primary) hypertension: Secondary | ICD-10-CM | POA: Insufficient documentation

## 2012-07-15 DIAGNOSIS — M310 Hypersensitivity angiitis: Principal | ICD-10-CM | POA: Diagnosis present

## 2012-07-15 DIAGNOSIS — S02401A Maxillary fracture, unspecified, initial encounter for closed fracture: Secondary | ICD-10-CM | POA: Insufficient documentation

## 2012-07-15 DIAGNOSIS — F1410 Cocaine abuse, uncomplicated: Secondary | ICD-10-CM | POA: Insufficient documentation

## 2012-07-15 DIAGNOSIS — R21 Rash and other nonspecific skin eruption: Secondary | ICD-10-CM | POA: Diagnosis present

## 2012-07-15 DIAGNOSIS — F17200 Nicotine dependence, unspecified, uncomplicated: Secondary | ICD-10-CM | POA: Insufficient documentation

## 2012-07-15 DIAGNOSIS — S0510XA Contusion of eyeball and orbital tissues, unspecified eye, initial encounter: Secondary | ICD-10-CM | POA: Insufficient documentation

## 2012-07-15 DIAGNOSIS — F332 Major depressive disorder, recurrent severe without psychotic features: Secondary | ICD-10-CM | POA: Insufficient documentation

## 2012-07-15 DIAGNOSIS — K0889 Other specified disorders of teeth and supporting structures: Secondary | ICD-10-CM | POA: Insufficient documentation

## 2012-07-15 DIAGNOSIS — N390 Urinary tract infection, site not specified: Secondary | ICD-10-CM | POA: Diagnosis present

## 2012-07-15 DIAGNOSIS — F419 Anxiety disorder, unspecified: Secondary | ICD-10-CM | POA: Diagnosis present

## 2012-07-15 DIAGNOSIS — S0993XA Unspecified injury of face, initial encounter: Secondary | ICD-10-CM | POA: Diagnosis present

## 2012-07-15 DIAGNOSIS — R509 Fever, unspecified: Secondary | ICD-10-CM | POA: Insufficient documentation

## 2012-07-15 DIAGNOSIS — Z91199 Patient's noncompliance with other medical treatment and regimen due to unspecified reason: Secondary | ICD-10-CM | POA: Insufficient documentation

## 2012-07-15 DIAGNOSIS — Z9119 Patient's noncompliance with other medical treatment and regimen: Secondary | ICD-10-CM | POA: Insufficient documentation

## 2012-07-15 DIAGNOSIS — L03119 Cellulitis of unspecified part of limb: Secondary | ICD-10-CM | POA: Insufficient documentation

## 2012-07-15 DIAGNOSIS — R000 Tachycardia, unspecified: Secondary | ICD-10-CM | POA: Insufficient documentation

## 2012-07-15 DIAGNOSIS — G8929 Other chronic pain: Secondary | ICD-10-CM | POA: Diagnosis present

## 2012-07-15 LAB — BASIC METABOLIC PANEL
BUN: 10 mg/dL (ref 6–23)
CO2: 19 mEq/L (ref 19–32)
Chloride: 101 mEq/L (ref 96–112)
Creatinine, Ser: 0.65 mg/dL (ref 0.50–1.10)
GFR calc Af Amer: >90 mL/min (ref >90)
Potassium: 3.1 mEq/L — ABNORMAL LOW (ref 3.5–5.1)

## 2012-07-15 LAB — CBC WITH DIFFERENTIAL/PLATELET
Basophils Absolute: 0 10*3/uL (ref 0.0–0.1)
Basophils Relative: 0 % (ref 0–1)
HCT: 39.5 % (ref 36.0–46.0)
Hemoglobin: 14.1 g/dL (ref 12.0–15.0)
Lymphocytes Relative: 20 % (ref 12–46)
MCHC: 35.7 g/dL (ref 30.0–36.0)
Monocytes Absolute: 0.2 10*3/uL (ref 0.1–1.0)
Monocytes Relative: 3 % (ref 3–12)
Neutro Abs: 5.4 10*3/uL (ref 1.7–7.7)
Neutrophils Relative %: 76 % (ref 43–77)
WBC: 7.2 10*3/uL (ref 4.0–10.5)

## 2012-07-15 LAB — POCT I-STAT TROPONIN I: Troponin i, poc: 0.01 ng/mL (ref 0.00–0.08)

## 2012-07-15 MED ORDER — HYDROMORPHONE HCL PF 1 MG/ML IJ SOLN
1.0000 mg | Freq: Once | INTRAMUSCULAR | Status: AC
  Administered 2012-07-15: 1 mg via INTRAVENOUS
  Filled 2012-07-15: qty 1

## 2012-07-15 MED ORDER — HYDROMORPHONE HCL PF 1 MG/ML IJ SOLN
1.0000 mg | Freq: Once | INTRAMUSCULAR | Status: AC
Start: 2012-07-15 — End: 2012-07-15
  Administered 2012-07-15: 1 mg via INTRAVENOUS
  Filled 2012-07-15: qty 1

## 2012-07-15 MED ORDER — CLINDAMYCIN PHOSPHATE 900 MG/50ML IV SOLN
900.0000 mg | Freq: Once | INTRAVENOUS | Status: AC
  Administered 2012-07-15: 900 mg via INTRAVENOUS
  Filled 2012-07-15: qty 50

## 2012-07-15 MED ORDER — LORAZEPAM 1 MG PO TABS
2.0000 mg | ORAL_TABLET | Freq: Once | ORAL | Status: AC
  Administered 2012-07-15: 2 mg via ORAL
  Filled 2012-07-15: qty 2

## 2012-07-15 MED ORDER — SODIUM CHLORIDE 0.9 % IV BOLUS (SEPSIS)
1000.0000 mL | Freq: Once | INTRAVENOUS | Status: AC
  Administered 2012-07-15: 1000 mL via INTRAVENOUS

## 2012-07-15 MED ORDER — LORAZEPAM 2 MG/ML IJ SOLN
1.0000 mg | Freq: Once | INTRAMUSCULAR | Status: AC
  Administered 2012-07-15: 1 mg via INTRAVENOUS
  Filled 2012-07-15: qty 1
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
## 2012-07-15 NOTE — ED Notes (Signed)
CT called, pt would not cooperate or sit still for CT, MD notified.

## 2012-07-15 NOTE — ED Notes (Signed)
Pt OOB yelling into hallway. Pt redirected to bed with assist, gait unsteady.
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
## 2012-07-15 NOTE — ED Notes (Signed)
Pt will not sit still, keeps getting out of bed. Pt instructed to stop pulling on leads and IV.
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
## 2012-07-15 NOTE — ED Notes (Addendum)
Pt crying that the pain from her flare up is so bad she can't take it. Pt informed she needs to wait until the physician can see her for pain medication. Pt appears to have scabies bumps all over body.
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
## 2012-07-16 ENCOUNTER — Inpatient Hospital Stay (HOSPITAL_COMMUNITY): Payer: Self-pay

## 2012-07-16 DIAGNOSIS — M79609 Pain in unspecified limb: Secondary | ICD-10-CM

## 2012-07-16 DIAGNOSIS — N390 Urinary tract infection, site not specified: Secondary | ICD-10-CM

## 2012-07-16 LAB — CBC
HCT: 38.1 % (ref 36.0–46.0)
Platelets: 217 10*3/uL (ref 150–400)
RDW: 12.7 % (ref 11.5–15.5)
WBC: 3.5 10*3/uL — ABNORMAL LOW (ref 4.0–10.5)

## 2012-07-16 LAB — COMPREHENSIVE METABOLIC PANEL
Alkaline Phosphatase: 76 U/L (ref 39–117)
BUN: 9 mg/dL (ref 6–23)
Creatinine, Ser: 0.53 mg/dL (ref 0.50–1.10)
GFR calc Af Amer: >90 mL/min (ref >90)
Glucose, Bld: 115 mg/dL — ABNORMAL HIGH (ref 70–99)
Potassium: 3.1 mEq/L — ABNORMAL LOW (ref 3.5–5.1)
Total Bilirubin: 0.7 mg/dL (ref 0.3–1.2)
Total Protein: 6 g/dL (ref 6.0–8.3)

## 2012-07-16 LAB — RAPID URINE DRUG SCREEN, HOSP PERFORMED
Cocaine: POSITIVE — AB
Opiates: POSITIVE — AB

## 2012-07-16 LAB — URINALYSIS, ROUTINE W REFLEX MICROSCOPIC
Glucose, UA: 100 mg/dL — AB
Nitrite: POSITIVE — AB
Specific Gravity, Urine: 1.029 (ref 1.005–1.030)
pH: 6 (ref 5.0–8.0)

## 2012-07-16 LAB — MAGNESIUM: Magnesium: 1.7 mg/dL (ref 1.5–2.5)

## 2012-07-16 LAB — AMMONIA: Ammonia: 36 umol/L (ref 11–60)

## 2012-07-16 LAB — MRSA PCR SCREENING: MRSA by PCR: NEGATIVE

## 2012-07-16 LAB — URINE MICROSCOPIC-ADD ON

## 2012-07-16 MED ORDER — IBUPROFEN 400 MG PO TABS
400.0000 mg | ORAL_TABLET | Freq: Four times a day (QID) | ORAL | Status: DC | PRN
Start: 2012-07-16 — End: 2012-07-20
  Filled 2012-07-16 (×3): qty 1

## 2012-07-16 MED ORDER — VANCOMYCIN HCL 1000 MG IV SOLR
1250.0000 mg | Freq: Two times a day (BID) | INTRAVENOUS | Status: DC
  Administered 2012-07-16 – 2012-07-18 (×6): 1250 mg via INTRAVENOUS
  Filled 2012-07-16 (×8): qty 1250

## 2012-07-16 MED ORDER — ACETAMINOPHEN 325 MG PO TABS
325.0000 mg | ORAL_TABLET | Freq: Four times a day (QID) | ORAL | Status: DC | PRN
  Administered 2012-07-17: 325 mg via ORAL
  Filled 2012-07-16: qty 1

## 2012-07-16 MED ORDER — ONDANSETRON HCL 4 MG PO TABS: 4.0000 mg | ORAL_TABLET | Freq: Four times a day (QID) | ORAL | Status: DC | PRN

## 2012-07-16 MED ORDER — PIPERACILLIN-TAZOBACTAM 3.375 G IVPB
3.3750 g | Freq: Three times a day (TID) | INTRAVENOUS | Status: DC
  Administered 2012-07-16 – 2012-07-19 (×11): 3.375 g via INTRAVENOUS
  Filled 2012-07-16 (×13): qty 50

## 2012-07-16 MED ORDER — MORPHINE SULFATE 2 MG/ML IJ SOLN: 2.0000 mg | INTRAMUSCULAR | Status: DC | PRN

## 2012-07-16 MED ORDER — PREDNISONE 10 MG PO TABS
10.0000 mg | ORAL_TABLET | Freq: Every day | ORAL | Status: AC
  Administered 2012-07-16 – 2012-07-20 (×5): 10 mg via ORAL
  Filled 2012-07-16 (×6): qty 1

## 2012-07-16 MED ORDER — SODIUM CHLORIDE 0.9 % IJ SOLN
3.0000 mL | Freq: Two times a day (BID) | INTRAMUSCULAR | Status: DC
  Administered 2012-07-16 – 2012-07-18 (×3): 3 mL via INTRAVENOUS

## 2012-07-16 MED ORDER — SODIUM CHLORIDE 0.9 % IV SOLN
INTRAVENOUS | Status: DC
  Administered 2012-07-16: 02:00:00 via INTRAVENOUS

## 2012-07-16 MED ORDER — ONDANSETRON HCL 4 MG/2ML IJ SOLN
4.0000 mg | Freq: Four times a day (QID) | INTRAMUSCULAR | Status: DC | PRN
Start: 2012-07-16 — End: 2012-07-20

## 2012-07-16 MED ORDER — THIAMINE HCL 100 MG/ML IJ SOLN
100.0000 mg | Freq: Every day | INTRAMUSCULAR | Status: DC
  Administered 2012-07-16 – 2012-07-17 (×2): 100 mg via INTRAVENOUS
  Filled 2012-07-16 (×2): qty 1

## 2012-07-16 MED ORDER — POTASSIUM CHLORIDE IN NACL 20-0.9 MEQ/L-% IV SOLN
INTRAVENOUS | Status: DC
  Administered 2012-07-16 – 2012-07-19 (×6): via INTRAVENOUS
  Filled 2012-07-16 (×11): qty 1000

## 2012-07-16 MED ORDER — POTASSIUM CHLORIDE 10 MEQ/100ML IV SOLN
10.0000 meq | INTRAVENOUS | Status: AC
  Administered 2012-07-16 (×4): 10 meq via INTRAVENOUS
  Filled 2012-07-16 (×4): qty 100

## 2012-07-16 MED ORDER — CLONAZEPAM 1 MG PO TABS
1.0000 mg | ORAL_TABLET | Freq: Two times a day (BID) | ORAL | Status: DC
  Administered 2012-07-16 – 2012-07-17 (×4): 1 mg via ORAL
  Filled 2012-07-16 (×5): qty 1
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
## 2012-07-16 NOTE — Consult Note (Signed)
Patient Identification:  [name] Date of Evaluation:  07/16/2012 Reason for Consult: Severe Depression  Referring Provider:   Dr. Rito Ehrlich  History of Present Illness: History and mental status exam cannot be provided at this time. The patient has been taken off the floor for procedure and this examination will be resumed when she returns. 07/17/12 Past Psychiatric History   Past Medical History:     Past Medical History  Diagnosis Date  . Depression   . Chronic pain   . Leukocytoclastic vasculitis   . Tobacco abuse   . Lupus   . Lupus        Past Surgical History  Procedure Date  . Cholecystectomy   . Tubal ligation   . Ankle surgery     Allergies: No Known Allergies  Current Medications:  Prior to Admission medications   Medication Sig Start Date End Date Taking? Authorizing Provider  acetaminophen (TYLENOL) 325 MG tablet Take by mouth every 6 (six) hours as needed. For pain   Yes Historical Provider, MD  ALPRAZolam Prudy Feeler) 0.5 MG tablet Take 0.5 mg by mouth 2 (two) times daily.    Yes Historical Provider, MD  clonazePAM (KLONOPIN) 1 MG tablet Take 1 mg by mouth 2 (two) times daily.   Yes Historical Provider, MD  ibuprofen (ADVIL,MOTRIN) 200 MG tablet Take 400 mg by mouth every 6 (six) hours as needed. For pain   Yes Historical Provider, MD  predniSONE (DELTASONE) 10 MG tablet Take 10-50 mg by mouth daily. Taper dose take 40 mg for 5 days, 30 mg for 5 days.20 mg for 5 days and then 10 mg for 5 days patient is on the 10 mg doses 04/18/12  Yes Geoffery Lyons, MD    Social History:    reports that she has been smoking Cigarettes.  She has been smoking about 1 pack per day. She has never used smokeless tobacco. She reports that she uses illicit drugs. She reports that she does not drink alcohol.   Family History:    History reviewed. No pertinent family history.  Mental Status Examination/Evaluation: Objective:  Appearance:   Psychomotor Activity:    Eye Contact::      Speech:   Volume  Mood:    Affect:    Thought Process:    Orientation:    Thought Content:   Suicidal Thoughts:   Homicidal Thoughts:    Judgement:  Insight:     DIAGNOSIS:   AXIS I   AXIS II   AXIS III See medical notes.  AXIS IV    AXIS V    Assessment/Plan:  Patient will be assessed 07/17/12 if she is conscious and can participate in mental status exam to evaluate her depression. Dr. Rito Ehrlich will be contacted when this consultation is completed. RECOMMENDATION:  1.  call for consultation when patient is fully conscious. Samwise Eckardt J. Ferol Luz, MD Psychiatrist  07/16/2012 .1:53 PM
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
## 2012-07-16 NOTE — Progress Notes (Signed)
Bilateral:  No evidence of DVT, superficial thrombosis, or Baker's Cyst.   

## 2012-07-16 NOTE — Progress Notes (Signed)
ANTIBIOTIC CONSULT NOTE - INITIAL  Pharmacy Consult for vancomycin Indication: wound infection  No Known Allergies  Patient Measurements: Height: 5\' 3"  (160 cm) Weight: 199 lb 4.8 oz (90.402 kg) IBW/kg (Calculated) : 52.4    Vital Signs: Temp: 97.4 F (36.3 C) (08/27 1744) Temp src: Oral (08/27 1744) BP: 148/87 mmHg (08/27 2345) Pulse Rate: 113  (08/27 2345) Intake/Output from previous day:   Intake/Output from this shift:    Labs:  [facility] 07/15/12 1830  WBC 7.2  HGB 14.1  PLT 222  LABCREA --  CREATININE 0.65   Estimated Creatinine Clearance: 97.8 ml/min (by C-G formula based on Cr of 0.65). No results found for this basename: VANCOTROUGH:2,VANCOPEAK:2,VANCORANDOM:2,GENTTROUGH:2,GENTPEAK:2,GENTRANDOM:2,TOBRATROUGH:2,TOBRAPEAK:2,TOBRARND:2,AMIKACINPEAK:2,AMIKACINTROU:2,AMIKACIN:2, in the last 72 hours   Microbiology: No results found for this or any previous visit (from the past 720 hour(s)).  Medical History: Past Medical History  Diagnosis Date  . Depression   . Chronic pain   . Leukocytoclastic vasculitis   . Tobacco abuse   . Lupus   . Lupus     Medications:  Prescriptions prior to admission  Medication Sig Dispense Refill  . acetaminophen (TYLENOL) 325 MG tablet Take by mouth every 6 (six) hours as needed. For pain      . ALPRAZolam (XANAX) 0.5 MG tablet Take 0.5 mg by mouth 2 (two) times daily.       . clonazePAM (KLONOPIN) 1 MG tablet Take 1 mg by mouth 2 (two) times daily.      Marland Kitchen ibuprofen (ADVIL,MOTRIN) 200 MG tablet Take 400 mg by mouth every 6 (six) hours as needed. For pain      . predniSONE (DELTASONE) 10 MG tablet Take 10-50 mg by mouth daily. Taper dose take 40 mg for 5 days, 30 mg for 5 days.20 mg for 5 days and then 10 mg for 5 days patient is on the 10 mg doses       Assessment: 41 yo with purulent drainage from left ankle wound to begin vancomycin therapy.  Goal of Therapy:  Vancomycin trough level 10-15 mcg/ml  Plan:  Vancomycin  1250 mg IV q12 hours. F/u renal function, cultures and clinical progress. Check vanc trough when appropriate.  [name] 07/16/2012,1:41 AM

## 2012-07-17 DIAGNOSIS — S0993XA Unspecified injury of face, initial encounter: Secondary | ICD-10-CM | POA: Diagnosis present

## 2012-07-17 DIAGNOSIS — F332 Major depressive disorder, recurrent severe without psychotic features: Secondary | ICD-10-CM

## 2012-07-17 LAB — CBC
HCT: 33.6 % — ABNORMAL LOW (ref 36.0–46.0)
Hemoglobin: 11.7 g/dL — ABNORMAL LOW (ref 12.0–15.0)
MCH: 29.4 pg (ref 26.0–34.0)
MCHC: 34.8 g/dL (ref 30.0–36.0)
MCV: 84.4 fL (ref 78.0–100.0)
Platelets: 194 K/uL (ref 150–400)
RBC: 3.98 MIL/uL (ref 3.87–5.11)
RDW: 13.1 % (ref 11.5–15.5)
WBC: 4.5 K/uL (ref 4.0–10.5)

## 2012-07-17 LAB — COMPREHENSIVE METABOLIC PANEL WITH GFR
ALT: 16 U/L (ref 0–35)
AST: 15 U/L (ref 0–37)
Albumin: 2.4 g/dL — ABNORMAL LOW (ref 3.5–5.2)
Alkaline Phosphatase: 62 U/L (ref 39–117)
BUN: 6 mg/dL (ref 6–23)
CO2: 22 meq/L (ref 19–32)
Calcium: 8.3 mg/dL — ABNORMAL LOW (ref 8.4–10.5)
Chloride: 109 meq/L (ref 96–112)
Creatinine, Ser: 0.76 mg/dL (ref 0.50–1.10)
GFR calc Af Amer: >90 mL/min
GFR calc non Af Amer: >90 mL/min
Glucose, Bld: 102 mg/dL — ABNORMAL HIGH (ref 70–99)
Potassium: 3.2 meq/L — ABNORMAL LOW (ref 3.5–5.1)
Sodium: 139 meq/L (ref 135–145)
Total Bilirubin: 0.4 mg/dL (ref 0.3–1.2)
Total Protein: 5.5 g/dL — ABNORMAL LOW (ref 6.0–8.3)

## 2012-07-17 LAB — URINE CULTURE: Colony Count: 5000

## 2012-07-17 MED ORDER — POTASSIUM CHLORIDE 10 MEQ/100ML IV SOLN
10.0000 meq | INTRAVENOUS | Status: AC
  Administered 2012-07-17 (×4): 10 meq via INTRAVENOUS
  Filled 2012-07-17 (×4): qty 100

## 2012-07-17 MED ORDER — SODIUM CHLORIDE 0.9 % IJ SOLN
10.0000 mL | INTRAMUSCULAR | Status: DC | PRN
  Administered 2012-07-19 – 2012-07-20 (×4): 10 mL

## 2012-07-17 MED ORDER — VITAMIN B-1 100 MG PO TABS
100.0000 mg | ORAL_TABLET | Freq: Every day | ORAL | Status: DC
  Administered 2012-07-18 – 2012-07-20 (×3): 100 mg via ORAL
  Filled 2012-07-17 (×3): qty 1

## 2012-07-17 MED ORDER — CLONAZEPAM 0.5 MG PO TABS
0.5000 mg | ORAL_TABLET | Freq: Two times a day (BID) | ORAL | Status: DC
  Administered 2012-07-17 – 2012-07-20 (×6): 0.5 mg via ORAL
  Filled 2012-07-17 (×7): qty 1

## 2012-07-17 MED ORDER — OXYCODONE HCL 5 MG PO TABS
5.0000 mg | ORAL_TABLET | Freq: Four times a day (QID) | ORAL | Status: DC | PRN
  Administered 2012-07-17 – 2012-07-20 (×10): 5 mg via ORAL
  Filled 2012-07-17 (×10): qty 1

## 2012-07-17 MED ORDER — POTASSIUM CHLORIDE CRYS ER 20 MEQ PO TBCR
40.0000 meq | EXTENDED_RELEASE_TABLET | Freq: Once | ORAL | Status: AC
  Administered 2012-07-17: 40 meq via ORAL
  Filled 2012-07-17: qty 2

## 2012-07-17 MED ORDER — POTASSIUM CHLORIDE 10 MEQ/100ML IV SOLN
10.0000 meq | INTRAVENOUS | Status: DC
  Filled 2012-07-17 (×4): qty 100

## 2012-07-17 NOTE — Progress Notes (Signed)
1700 Noted red rash over entire body and left ankle with old scab area.
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
## 2012-07-17 NOTE — Progress Notes (Signed)
Called [phone] unit to give report.  Receiving RN unable to take report at this time. 

## 2012-07-17 NOTE — Progress Notes (Signed)
Peripherally Inserted Central Catheter/Midline Placement  The IV Nurse has discussed with the patient and/or persons authorized to consent for the patient, the purpose of this procedure and the potential benefits and risks involved with this procedure.  The benefits include less needle sticks, lab draws from the catheter and patient may be discharged home with the catheter.  Risks include, but not limited to, infection, bleeding, blood clot (thrombus formation), and puncture of an artery; nerve damage and irregular heat beat.  Alternatives to this procedure were also discussed.  PICC/Midline Placement Documentation  PICC / Midline Double Lumen 07/17/12 PICC Right Basilic (Active)       Stacie Glaze Horton 07/17/2012, 6:07 PM
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
## 2012-07-17 NOTE — Progress Notes (Signed)
Called to start another iv for pt; pt has had 5 ivs in 3 days due to frequent infiltrations; pt on vanco and zosyn;  Has been getting multiple runs of K;  Arms edematous, bruised; pt has 1 iv site, but needs 2nd site;  Pt known to IV Team; picc line has been ordered and pt is on the list;   RN aware;   Barkley Bruns RN IV Team

## 2012-07-17 NOTE — Progress Notes (Signed)
1535 Patient arrived to room via wheelchair from unit 2000 on arrival to floor noted patient IV site to be infiltrated and redden. IV removed  And IV team made aware of retsart

## 2012-07-18 DIAGNOSIS — S0993XA Unspecified injury of face, initial encounter: Secondary | ICD-10-CM

## 2012-07-18 LAB — COMPREHENSIVE METABOLIC PANEL
ALT: 14 U/L (ref 0–35)
Albumin: 2.4 g/dL — ABNORMAL LOW (ref 3.5–5.2)
Alkaline Phosphatase: 67 U/L (ref 39–117)
Calcium: 8.5 mg/dL (ref 8.4–10.5)
GFR calc Af Amer: >90 mL/min (ref >90)
Potassium: 3.4 mEq/L — ABNORMAL LOW (ref 3.5–5.1)
Sodium: 140 mEq/L (ref 135–145)
Total Protein: 5.7 g/dL — ABNORMAL LOW (ref 6.0–8.3)

## 2012-07-18 LAB — CBC
MCH: 29.3 pg (ref 26.0–34.0)
MCHC: 33.6 g/dL (ref 30.0–36.0)
Platelets: 220 10*3/uL (ref 150–400)
RDW: 13.5 % (ref 11.5–15.5)

## 2012-07-18 LAB — TSH: TSH: 4.485 u[IU]/mL (ref 0.350–4.500)

## 2012-07-18 MED ORDER — GABAPENTIN 300 MG PO CAPS
300.0000 mg | ORAL_CAPSULE | Freq: Two times a day (BID) | ORAL | Status: DC
  Administered 2012-07-18 – 2012-07-19 (×3): 300 mg via ORAL
  Filled 2012-07-18 (×4): qty 1

## 2012-07-18 MED ORDER — POTASSIUM CHLORIDE 10 MEQ/100ML IV SOLN
10.0000 meq | INTRAVENOUS | Status: AC
  Administered 2012-07-18 (×4): 10 meq via INTRAVENOUS
  Filled 2012-07-18 (×4): qty 100

## 2012-07-18 MED ORDER — ALPRAZOLAM 0.5 MG PO TABS
0.5000 mg | ORAL_TABLET | Freq: Two times a day (BID) | ORAL | Status: DC | PRN
  Administered 2012-07-18 – 2012-07-20 (×4): 0.5 mg via ORAL
  Filled 2012-07-18 (×4): qty 1

## 2012-07-18 MED ORDER — VANCOMYCIN HCL 1000 MG IV SOLR
1500.0000 mg | Freq: Two times a day (BID) | INTRAVENOUS | Status: DC
  Administered 2012-07-19: 1500 mg via INTRAVENOUS
  Filled 2012-07-18 (×2): qty 1500
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
## 2012-07-18 NOTE — Progress Notes (Signed)
Physical Therapy Treatment Patient Details Name: [name] MRN: [id number] DOB: September 15, 1971 Today's Date: 07/18/2012 Time: 4098-1191 PT Time Calculation (min): 17 min  PT Assessment / Plan / Recommendation Comments on Treatment Session  Patient very agitated today - reports being angry with staff - not listening/ won't give me pain meds.  Patient refusing to ambulate further than bathroom today.  Encouraged patient to call for assist and ambulate in hallway for mobility.    Follow Up Recommendations  No PT follow up    Barriers to Discharge        Equipment Recommendations  None recommended by PT    Recommendations for Other Services    Frequency Min 3X/week   Plan Discharge plan remains appropriate;Frequency remains appropriate    Precautions / Restrictions Precautions Precautions: Fall Precaution Comments: Impulsive Restrictions Weight Bearing Restrictions: No       Mobility  Bed Mobility Bed Mobility: Supine to Sit;Sit to Supine Supine to Sit: 7: Independent;HOB flat Sitting - Scoot to Edge of Bed: 7: Independent Sit to Supine: 7: Independent Details for Bed Mobility Assistance: Provided cues for safety. Transfers Transfers: Sit to Stand;Stand to Sit Sit to Stand: 7: Independent;From bed;From toilet Stand to Sit: 7: Independent;To toilet;To bed Details for Transfer Assistance: Provided verbal cues for safety - patient unwilling to follow Ambulation/Gait Ambulation/Gait Assistance: 5: Supervision Ambulation Distance (Feet): 26 Feet Assistive device: None Ambulation/Gait Assistance Details: Gait somewhat unsteady, however no loss of balance.  Provided cues for safety - unwilling to follow. Gait Pattern: Decreased stride length;Wide base of support      PT Goals Acute Rehab PT Goals PT Goal: Sit to Stand - Progress: Met PT Goal: Stand to Sit - Progress: Met PT Goal: Ambulate - Progress: Progressing toward goal  Visit Information  Last PT Received On:  07/18/12 Assistance Needed: +1    Subjective Data  Subjective: My knees hurt too much to walk any more   Cognition  Overall Cognitive Status: Impaired Area of Impairment: Safety/judgement Arousal/Alertness: Awake/alert Orientation Level: Appears intact for tasks assessed Behavior During Session: Anxious (Agitated) Safety/Judgement: Impulsive Safety/Judgement - Other Comments: Reaching over recliner to attempt to unplug IV - agitated when PT instructed her to sit down so that PT could unplug equipment.    Balance     End of Session PT - End of Session Activity Tolerance: Treatment limited secondary to agitation Patient left: in bed;with call bell/phone within reach Nurse Communication: Mobility status   GP     Vena Austria 07/18/2012, 6:54 PM Durenda Hurt. Renaldo Fiddler, [facility] Acute Rehab Services Pager [phone]

## 2012-07-18 NOTE — Progress Notes (Signed)
ANTIBIOTIC CONSULT NOTE - FOLLOW UP  Pharmacy Consult for Vancomycin Indication: LLE cellulits  No Known Allergies  Patient Measurements: Height: 5\' 3"  (160 cm) Weight: 199 lb 4.8 oz (90.402 kg) IBW/kg (Calculated) : 52.4    Vital Signs: Temp: 98.1 F (36.7 C) (08/30 1308) Temp src: Oral (08/30 1308) BP: 105/71 mmHg (08/30 1308) Pulse Rate: 93  (08/30 1308) Intake/Output from previous day: 08/29 0701 - 08/30 0700 In: 1227 [P.O.:360; I.V.:650; IV Piggyback:217] Out: 650 [Urine:650] Intake/Output from this shift: Total I/O In: 240 [P.O.:240] Out: -   Labs:  Basename 07/18/12 0510 07/17/12 0700 07/16/12 0954  WBC 6.1 4.5 3.5*  HGB 11.0* 11.7* 13.5  PLT 220 194 217  LABCREA -- -- --  CREATININE 0.72 0.76 0.53   Estimated Creatinine Clearance: 97.8 ml/min (by C-G formula based on Cr of 0.72).  Basename 07/18/12 1330  VANCOTROUGH 8.2*  VANCOPEAK --  Drue Dun --  GENTTROUGH --  GENTPEAK --  GENTRANDOM --  TOBRATROUGH --  TOBRAPEAK --  TOBRARND --  AMIKACINPEAK --  AMIKACINTROU --  AMIKACIN --     Microbiology: Recent Results (from the past 720 hour(s))  MRSA PCR SCREENING     Status: Normal   Collection Time   07/16/12  3:17 AM      Component Value Range Status Comment   MRSA by PCR NEGATIVE  NEGATIVE Final   URINE CULTURE     Status: Normal   Collection Time   07/16/12  3:20 PM      Component Value Range Status Comment   Specimen Description URINE, RANDOM   Final    Special Requests NONE   Final    Culture  Setup Time 07/16/2012 15:37   Final    Colony Count 5,000 COLONIES/ML   Final    Culture INSIGNIFICANT GROWTH   Final    Report Status 07/17/2012 FINAL   Final     Anti-infectives     Start     Dose/Rate Route Frequency Ordered Stop   07/16/12 0200  piperacillin-tazobactam (ZOSYN) IVPB 3.375 g       3.375 g 12.5 mL/hr over 240 Minutes Intravenous Every 8 hours 07/16/12 0135     07/16/12 0200   vancomycin (VANCOCIN) 1,250 mg in sodium  chloride 0.9 % 250 mL IVPB        1,250 mg 166.7 mL/hr over 90 Minutes Intravenous Every 12 hours 07/16/12 0145     07/15/12 2030   clindamycin (CLEOCIN) IVPB 900 mg        900 mg 100 mL/hr over 30 Minutes Intravenous  Once 07/15/12 2018 07/15/12 2216          Assessment: 41 yo F with L ankle/foot cellulits, improving on Vancomycin and Zosyn.  Noted MD plans to change to po therapy in 1-2 days.  WBC remain WNL.  SCr stable at 0.72 with UOP ok at 0.3 ml/kg/hr.  Vancomycin trough level slightly below goal.  Goal of Therapy:  Vancomycin trough level 10-15 mcg/ml  Plan:  - Increase vancomycin to 1500 mg IV q12h - schedule next dose for midnight as 1250 mg dose currently hanging - Plan for no further levels with anticipated change to po antibiotics soon  Jill Side L. Illene Bolus, PharmD, BCPS Clinical Pharmacist Pager: [phone] Pharmacy: [phone] 07/18/2012 4:37 PM
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
## 2012-07-19 DIAGNOSIS — F329 Major depressive disorder, single episode, unspecified: Secondary | ICD-10-CM

## 2012-07-19 DIAGNOSIS — F419 Anxiety disorder, unspecified: Secondary | ICD-10-CM | POA: Diagnosis present

## 2012-07-19 LAB — CBC
HCT: 33.7 % — ABNORMAL LOW (ref 36.0–46.0)
Hemoglobin: 11.1 g/dL — ABNORMAL LOW (ref 12.0–15.0)
MCH: 28.5 pg (ref 26.0–34.0)
MCHC: 32.9 g/dL (ref 30.0–36.0)
RBC: 3.89 MIL/uL (ref 3.87–5.11)

## 2012-07-19 LAB — BASIC METABOLIC PANEL
BUN: 6 mg/dL (ref 6–23)
CO2: 24 mEq/L (ref 19–32)
GFR calc non Af Amer: >90 mL/min (ref >90)
Glucose, Bld: 92 mg/dL (ref 70–99)
Potassium: 3.8 mEq/L (ref 3.5–5.1)
Sodium: 141 mEq/L (ref 135–145)

## 2012-07-19 MED ORDER — LIDOCAINE VISCOUS 2 % MT SOLN
20.0000 mL | OROMUCOSAL | Status: DC | PRN
  Filled 2012-07-19: qty 20

## 2012-07-19 MED ORDER — CEPHALEXIN 500 MG PO CAPS
500.0000 mg | ORAL_CAPSULE | Freq: Three times a day (TID) | ORAL | Status: DC
  Administered 2012-07-19 – 2012-07-20 (×4): 500 mg via ORAL
  Filled 2012-07-19 (×6): qty 1

## 2012-07-19 MED ORDER — DOXYCYCLINE HYCLATE 100 MG PO TABS
100.0000 mg | ORAL_TABLET | Freq: Two times a day (BID) | ORAL | Status: DC
  Administered 2012-07-19 – 2012-07-20 (×3): 100 mg via ORAL
  Filled 2012-07-19 (×4): qty 1

## 2012-07-19 MED ORDER — RISPERIDONE 0.5 MG PO TABS
0.5000 mg | ORAL_TABLET | Freq: Every day | ORAL | Status: DC
  Administered 2012-07-19: 0.5 mg via ORAL
  Filled 2012-07-19 (×2): qty 1

## 2012-07-19 MED ORDER — GABAPENTIN 300 MG PO CAPS
300.0000 mg | ORAL_CAPSULE | Freq: Three times a day (TID) | ORAL | Status: DC
Start: 2012-07-19 — End: 2012-07-20
  Administered 2012-07-19 – 2012-07-20 (×4): 300 mg via ORAL
  Filled 2012-07-19 (×5): qty 1
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
## 2012-07-20 LAB — CBC
MCH: 29.3 pg (ref 26.0–34.0)
MCV: 86.3 fL (ref 78.0–100.0)
Platelets: 237 10*3/uL (ref 150–400)
RDW: 12.8 % (ref 11.5–15.5)
WBC: 6.9 10*3/uL (ref 4.0–10.5)

## 2012-07-20 LAB — BASIC METABOLIC PANEL
Calcium: 9.8 mg/dL (ref 8.4–10.5)
Creatinine, Ser: 0.73 mg/dL (ref 0.50–1.10)
GFR calc Af Amer: >90 mL/min (ref >90)
Sodium: 138 mEq/L (ref 135–145)

## 2012-07-20 MED ORDER — DOXYCYCLINE HYCLATE 100 MG PO TABS: 100.0000 mg | ORAL_TABLET | Freq: Two times a day (BID) | ORAL | Status: AC

## 2012-07-20 MED ORDER — POTASSIUM CHLORIDE CRYS ER 20 MEQ PO TBCR
40.0000 meq | EXTENDED_RELEASE_TABLET | Freq: Once | ORAL | Status: AC
  Administered 2012-07-20: 40 meq via ORAL
  Filled 2012-07-20: qty 2

## 2012-07-20 MED ORDER — CLONAZEPAM 1 MG PO TABS: 1.0000 mg | ORAL_TABLET | Freq: Two times a day (BID) | ORAL | Status: DC

## 2012-07-20 MED ORDER — GABAPENTIN 300 MG PO CAPS: 300.0000 mg | ORAL_CAPSULE | Freq: Three times a day (TID) | ORAL | Status: DC

## 2012-07-20 MED ORDER — PREDNISONE 10 MG PO TABS: 10.0000 mg | ORAL_TABLET | Freq: Every day | ORAL | Status: DC

## 2012-07-20 MED ORDER — OXYCODONE HCL 5 MG PO TABS: 5.0000 mg | ORAL_TABLET | Freq: Four times a day (QID) | ORAL | Status: AC | PRN

## 2012-07-20 MED ORDER — CEPHALEXIN 500 MG PO CAPS: 500.0000 mg | ORAL_CAPSULE | Freq: Three times a day (TID) | ORAL | Status: AC

## 2012-07-20 MED ORDER — LIDOCAINE VISCOUS 2 % MT SOLN: 20.0000 mL | OROMUCOSAL | Status: AC | PRN

## 2012-07-20 MED ORDER — RISPERIDONE 0.5 MG PO TABS: 0.5000 mg | ORAL_TABLET | Freq: Every day | ORAL | Status: DC
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
## 2012-08-25 ENCOUNTER — Emergency Department (HOSPITAL_COMMUNITY)
Admission: EM | Admit: 2012-08-25 | Discharge: 2012-08-25 | Disposition: A | Discharge status: Home / Self Care | Payer: Self-pay | Attending: Emergency Medicine | Admitting: Emergency Medicine

## 2012-08-25 ENCOUNTER — Encounter (HOSPITAL_COMMUNITY): Payer: Self-pay | Admitting: *Deleted

## 2012-08-25 DIAGNOSIS — Z9089 Acquired absence of other organs: Secondary | ICD-10-CM | POA: Insufficient documentation

## 2012-08-25 DIAGNOSIS — E860 Dehydration: Secondary | ICD-10-CM | POA: Insufficient documentation

## 2012-08-25 DIAGNOSIS — G8929 Other chronic pain: Secondary | ICD-10-CM | POA: Insufficient documentation

## 2012-08-25 DIAGNOSIS — M310 Hypersensitivity angiitis: Secondary | ICD-10-CM | POA: Insufficient documentation

## 2012-08-25 DIAGNOSIS — Z760 Encounter for issue of repeat prescription: Secondary | ICD-10-CM | POA: Insufficient documentation

## 2012-08-25 DIAGNOSIS — Z79899 Other long term (current) drug therapy: Secondary | ICD-10-CM | POA: Insufficient documentation

## 2012-08-25 LAB — URINE MICROSCOPIC-ADD ON

## 2012-08-25 LAB — CBC WITH DIFFERENTIAL/PLATELET
Basophils Absolute: 0 10*3/uL (ref 0.0–0.1)
Lymphocytes Relative: 18 % (ref 12–46)
Lymphs Abs: 1.8 10*3/uL (ref 0.7–4.0)
Neutro Abs: 7.2 10*3/uL (ref 1.7–7.7)
Neutrophils Relative %: 75 % (ref 43–77)
Platelets: 319 10*3/uL (ref 150–400)
RBC: 4.89 MIL/uL (ref 3.87–5.11)
RDW: 12.7 % (ref 11.5–15.5)
WBC: 9.7 10*3/uL (ref 4.0–10.5)

## 2012-08-25 LAB — RAPID URINE DRUG SCREEN, HOSP PERFORMED
Amphetamines: POSITIVE — AB
Benzodiazepines: POSITIVE — AB
Cocaine: NOT DETECTED
Opiates: NOT DETECTED

## 2012-08-25 LAB — SEDIMENTATION RATE: Sed Rate: 27 mm/hr — ABNORMAL HIGH (ref 0–22)

## 2012-08-25 LAB — BASIC METABOLIC PANEL
CO2: 19 mEq/L (ref 19–32)
Calcium: 9 mg/dL (ref 8.4–10.5)
Chloride: 99 mEq/L (ref 96–112)
Potassium: 3.5 mEq/L (ref 3.5–5.1)
Sodium: 133 mEq/L — ABNORMAL LOW (ref 135–145)

## 2012-08-25 LAB — URINALYSIS, ROUTINE W REFLEX MICROSCOPIC
Glucose, UA: NEGATIVE mg/dL
Leukocytes, UA: NEGATIVE
Protein, ur: 30 mg/dL — AB
Specific Gravity, Urine: >1.046 — ABNORMAL HIGH (ref 1.005–1.030)
pH: 5.5 (ref 5.0–8.0)

## 2012-08-25 MED ORDER — SODIUM CHLORIDE 0.9 % IV SOLN: 1000.0000 mL | INTRAVENOUS | Status: DC

## 2012-08-25 MED ORDER — CLONAZEPAM 1 MG PO TABS: 1.0000 mg | ORAL_TABLET | Freq: Two times a day (BID) | ORAL | Status: DC

## 2012-08-25 MED ORDER — METOCLOPRAMIDE HCL 5 MG/ML IJ SOLN
10.0000 mg | Freq: Once | INTRAMUSCULAR | Status: AC
  Administered 2012-08-25: 10 mg via INTRAVENOUS
  Filled 2012-08-25: qty 2

## 2012-08-25 MED ORDER — METHYLPREDNISOLONE SODIUM SUCC 125 MG IJ SOLR
125.0000 mg | Freq: Once | INTRAMUSCULAR | Status: AC
  Administered 2012-08-25: 125 mg via INTRAVENOUS
  Filled 2012-08-25: qty 2

## 2012-08-25 MED ORDER — GABAPENTIN 300 MG PO CAPS
300.0000 mg | ORAL_CAPSULE | Freq: Once | ORAL | Status: AC
  Administered 2012-08-25: 300 mg via ORAL
  Filled 2012-08-25: qty 1

## 2012-08-25 MED ORDER — FENTANYL CITRATE 0.05 MG/ML IJ SOLN: 50.0000 ug | Freq: Once | INTRAMUSCULAR | Status: DC

## 2012-08-25 MED ORDER — PREDNISONE 10 MG PO TABS: 10.0000 mg | ORAL_TABLET | Freq: Every day | ORAL | Status: DC

## 2012-08-25 MED ORDER — FENTANYL CITRATE 0.05 MG/ML IJ SOLN
50.0000 ug | Freq: Once | INTRAMUSCULAR | Status: AC
  Administered 2012-08-25: 50 ug via INTRAVENOUS
  Filled 2012-08-25: qty 2

## 2012-08-25 MED ORDER — GABAPENTIN 300 MG PO CAPS: 300.0000 mg | ORAL_CAPSULE | Freq: Three times a day (TID) | ORAL | Status: DC

## 2012-08-25 MED ORDER — GABAPENTIN 600 MG PO TABS
300.0000 mg | ORAL_TABLET | Freq: Once | ORAL | Status: DC
  Filled 2012-08-25: qty 0.5

## 2012-08-25 MED ORDER — TRAMADOL-ACETAMINOPHEN 37.5-325 MG PO TABS: ORAL_TABLET | ORAL | Status: DC

## 2012-08-25 MED ORDER — RISPERIDONE 0.5 MG PO TABS: 0.5000 mg | ORAL_TABLET | Freq: Every day | ORAL | Status: DC

## 2012-08-25 MED ORDER — LORAZEPAM 2 MG/ML IJ SOLN
1.0000 mg | Freq: Once | INTRAMUSCULAR | Status: AC
  Administered 2012-08-25: 1 mg via INTRAVENOUS
  Filled 2012-08-25: qty 1

## 2012-08-25 MED ORDER — DIPHENHYDRAMINE HCL 50 MG/ML IJ SOLN
50.0000 mg | Freq: Once | INTRAMUSCULAR | Status: AC
  Administered 2012-08-25: 50 mg via INTRAVENOUS
  Filled 2012-08-25: qty 1

## 2012-08-25 MED ORDER — SODIUM CHLORIDE 0.9 % IV SOLN
1000.0000 mL | Freq: Once | INTRAVENOUS | Status: AC
  Administered 2012-08-25: 1000 mL via INTRAVENOUS
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
## 2012-08-25 NOTE — ED Notes (Signed)
EDP made aware of pt requesting pain medication 

## 2012-08-25 NOTE — ED Notes (Signed)
Pt c/o lupus exacerbation and pain related to this. Pt has raised areas on buttocks, bilateral arms, and bilateral legs. Pt in acute distress from pain.

## 2012-09-13 ENCOUNTER — Emergency Department (HOSPITAL_COMMUNITY): Payer: Self-pay

## 2012-09-13 ENCOUNTER — Encounter (HOSPITAL_COMMUNITY): Payer: Self-pay | Admitting: *Deleted

## 2012-09-13 ENCOUNTER — Observation Stay (HOSPITAL_COMMUNITY)
Admission: EM | Admit: 2012-09-13 | Discharge: 2012-09-15 | Disposition: A | Discharge status: Home / Self Care | Payer: Self-pay | Attending: Infectious Diseases | Admitting: Infectious Diseases

## 2012-09-13 DIAGNOSIS — F1310 Sedative, hypnotic or anxiolytic abuse, uncomplicated: Secondary | ICD-10-CM | POA: Insufficient documentation

## 2012-09-13 DIAGNOSIS — M25473 Effusion, unspecified ankle: Secondary | ICD-10-CM | POA: Insufficient documentation

## 2012-09-13 DIAGNOSIS — R21 Rash and other nonspecific skin eruption: Secondary | ICD-10-CM

## 2012-09-13 DIAGNOSIS — Z23 Encounter for immunization: Secondary | ICD-10-CM | POA: Insufficient documentation

## 2012-09-13 DIAGNOSIS — IMO0002 Reserved for concepts with insufficient information to code with codable children: Secondary | ICD-10-CM | POA: Insufficient documentation

## 2012-09-13 DIAGNOSIS — Z79899 Other long term (current) drug therapy: Secondary | ICD-10-CM | POA: Insufficient documentation

## 2012-09-13 DIAGNOSIS — I776 Arteritis, unspecified: Secondary | ICD-10-CM

## 2012-09-13 DIAGNOSIS — R350 Frequency of micturition: Secondary | ICD-10-CM | POA: Insufficient documentation

## 2012-09-13 DIAGNOSIS — F3289 Other specified depressive episodes: Secondary | ICD-10-CM | POA: Insufficient documentation

## 2012-09-13 DIAGNOSIS — F329 Major depressive disorder, single episode, unspecified: Secondary | ICD-10-CM | POA: Diagnosis present

## 2012-09-13 DIAGNOSIS — F32A Depression, unspecified: Secondary | ICD-10-CM

## 2012-09-13 DIAGNOSIS — M2550 Pain in unspecified joint: Secondary | ICD-10-CM

## 2012-09-13 DIAGNOSIS — R200 Anesthesia of skin: Secondary | ICD-10-CM

## 2012-09-13 DIAGNOSIS — F1910 Other psychoactive substance abuse, uncomplicated: Secondary | ICD-10-CM

## 2012-09-13 DIAGNOSIS — M310 Hypersensitivity angiitis: Principal | ICD-10-CM

## 2012-09-13 DIAGNOSIS — M25476 Effusion, unspecified foot: Secondary | ICD-10-CM | POA: Insufficient documentation

## 2012-09-13 DIAGNOSIS — F1210 Cannabis abuse, uncomplicated: Secondary | ICD-10-CM | POA: Insufficient documentation

## 2012-09-13 DIAGNOSIS — F1410 Cocaine abuse, uncomplicated: Secondary | ICD-10-CM | POA: Insufficient documentation

## 2012-09-13 DIAGNOSIS — Z8614 Personal history of Methicillin resistant Staphylococcus aureus infection: Secondary | ICD-10-CM | POA: Insufficient documentation

## 2012-09-13 DIAGNOSIS — D72829 Elevated white blood cell count, unspecified: Secondary | ICD-10-CM | POA: Diagnosis present

## 2012-09-13 LAB — BASIC METABOLIC PANEL WITH GFR
Calcium: 9.9 mg/dL (ref 8.4–10.5)
Creatinine, Ser: 0.68 mg/dL (ref 0.50–1.10)
GFR calc non Af Amer: >90 mL/min (ref >90)
Glucose, Bld: 126 mg/dL — ABNORMAL HIGH (ref 70–99)
Sodium: 136 meq/L (ref 135–145)

## 2012-09-13 LAB — CBC WITH DIFFERENTIAL/PLATELET
Basophils Absolute: 0 K/uL (ref 0.0–0.1)
Basophils Relative: 0 % (ref 0–1)
Eosinophils Absolute: 0 K/uL (ref 0.0–0.7)
Eosinophils Relative: 0 % (ref 0–5)
HCT: 42.9 % (ref 36.0–46.0)
Hemoglobin: 15.1 g/dL — ABNORMAL HIGH (ref 12.0–15.0)
Lymphocytes Relative: 10 % — ABNORMAL LOW (ref 12–46)
Lymphs Abs: 1.8 K/uL (ref 0.7–4.0)
MCH: 29.5 pg (ref 26.0–34.0)
MCHC: 35.2 g/dL (ref 30.0–36.0)
MCV: 84 fL (ref 78.0–100.0)
Monocytes Absolute: 0.8 10*3/uL (ref 0.1–1.0)
Monocytes Relative: 4 % (ref 3–12)
Neutro Abs: 15.8 10*3/uL — ABNORMAL HIGH (ref 1.7–7.7)
Neutrophils Relative %: 86 % — ABNORMAL HIGH (ref 43–77)
Platelets: 270 K/uL (ref 150–400)
RBC: 5.11 MIL/uL (ref 3.87–5.11)
RDW: 13.3 % (ref 11.5–15.5)
WBC: 18.4 10*3/uL — ABNORMAL HIGH (ref 4.0–10.5)

## 2012-09-13 LAB — BASIC METABOLIC PANEL
BUN: 18 mg/dL (ref 6–23)
CO2: 20 mEq/L (ref 19–32)
Chloride: 102 mEq/L (ref 96–112)
GFR calc Af Amer: >90 mL/min (ref >90)
Potassium: 4.1 mEq/L (ref 3.5–5.1)

## 2012-09-13 LAB — URINALYSIS, ROUTINE W REFLEX MICROSCOPIC
Glucose, UA: NEGATIVE mg/dL
Hgb urine dipstick: NEGATIVE
Ketones, ur: NEGATIVE mg/dL
Leukocytes, UA: NEGATIVE
Nitrite: NEGATIVE
Protein, ur: 30 mg/dL — AB
Specific Gravity, Urine: >1.046 — ABNORMAL HIGH (ref 1.005–1.030)
Urobilinogen, UA: 0.2 mg/dL (ref 0.0–1.0)
pH: 5.5 (ref 5.0–8.0)

## 2012-09-13 LAB — RAPID URINE DRUG SCREEN, HOSP PERFORMED
Amphetamines: POSITIVE — AB
Barbiturates: NOT DETECTED
Benzodiazepines: POSITIVE — AB
Cocaine: NOT DETECTED
Opiates: NOT DETECTED
Tetrahydrocannabinol: NOT DETECTED

## 2012-09-13 LAB — HIV ANTIBODY (ROUTINE TESTING W REFLEX): HIV: NONREACTIVE

## 2012-09-13 LAB — HEMOGLOBIN A1C
Hgb A1c MFr Bld: 5.5 % (ref <5.7)
Mean Plasma Glucose: 111 mg/dL (ref <117)

## 2012-09-13 LAB — URINE MICROSCOPIC-ADD ON

## 2012-09-13 LAB — HEPATITIS C ANTIBODY: HCV Ab: NEGATIVE

## 2012-09-13 LAB — ANGIOTENSIN CONVERTING ENZYME: Angiotensin-Converting Enzyme: 20 U/L (ref 8–52)

## 2012-09-13 LAB — SEDIMENTATION RATE: Sed Rate: 6 mm/hr (ref 0–22)

## 2012-09-13 MED ORDER — PREDNISONE 50 MG PO TABS
60.0000 mg | ORAL_TABLET | Freq: Every day | ORAL | Status: DC
  Administered 2012-09-14 – 2012-09-15 (×2): 60 mg via ORAL
  Filled 2012-09-13 (×3): qty 1

## 2012-09-13 MED ORDER — MORPHINE SULFATE 4 MG/ML IJ SOLN
4.0000 mg | Freq: Once | INTRAMUSCULAR | Status: AC
  Administered 2012-09-13: 4 mg via INTRAVENOUS
  Filled 2012-09-13: qty 1

## 2012-09-13 MED ORDER — METHYLPREDNISOLONE SODIUM SUCC 125 MG IJ SOLR
125.0000 mg | Freq: Once | INTRAMUSCULAR | Status: AC
  Administered 2012-09-13: 125 mg via INTRAVENOUS
  Filled 2012-09-13: qty 2

## 2012-09-13 MED ORDER — GABAPENTIN 300 MG PO CAPS
300.0000 mg | ORAL_CAPSULE | Freq: Three times a day (TID) | ORAL | Status: DC
  Administered 2012-09-13 – 2012-09-15 (×8): 300 mg via ORAL
  Filled 2012-09-13 (×9): qty 1

## 2012-09-13 MED ORDER — PNEUMOCOCCAL VAC POLYVALENT 25 MCG/0.5ML IJ INJ
0.5000 mL | INJECTION | INTRAMUSCULAR | Status: AC
  Administered 2012-09-14: 0.5 mL via INTRAMUSCULAR
  Filled 2012-09-13: qty 0.5

## 2012-09-13 MED ORDER — CLONAZEPAM 1 MG PO TABS
1.0000 mg | ORAL_TABLET | Freq: Two times a day (BID) | ORAL | Status: DC
  Administered 2012-09-13 – 2012-09-15 (×5): 1 mg via ORAL
  Filled 2012-09-13 (×5): qty 1

## 2012-09-13 MED ORDER — KETOROLAC TROMETHAMINE 30 MG/ML IJ SOLN
30.0000 mg | Freq: Four times a day (QID) | INTRAMUSCULAR | Status: DC
  Administered 2012-09-13 – 2012-09-15 (×9): 30 mg via INTRAVENOUS
  Filled 2012-09-13 (×13): qty 1

## 2012-09-13 MED ORDER — RISPERIDONE 0.5 MG PO TABS
0.5000 mg | ORAL_TABLET | Freq: Every day | ORAL | Status: DC
  Administered 2012-09-13 – 2012-09-14 (×2): 0.5 mg via ORAL
  Filled 2012-09-13 (×3): qty 1

## 2012-09-13 MED ORDER — PREDNISONE 10 MG PO TABS
10.0000 mg | ORAL_TABLET | Freq: Every day | ORAL | Status: DC
  Administered 2012-09-13: 10 mg via ORAL
  Filled 2012-09-13 (×2): qty 1

## 2012-09-13 MED ORDER — SODIUM CHLORIDE 0.9 % IV BOLUS (SEPSIS)
500.0000 mL | Freq: Once | INTRAVENOUS | Status: AC
  Administered 2012-09-13: 08:00:00 via INTRAVENOUS

## 2012-09-13 MED ORDER — DIPHENHYDRAMINE HCL 25 MG PO CAPS
25.0000 mg | ORAL_CAPSULE | Freq: Four times a day (QID) | ORAL | Status: DC | PRN
  Administered 2012-09-13 – 2012-09-15 (×5): 25 mg via ORAL
  Filled 2012-09-13 (×6): qty 1

## 2012-09-13 MED ORDER — MORPHINE SULFATE 2 MG/ML IJ SOLN
1.0000 mg | INTRAMUSCULAR | Status: DC | PRN
  Administered 2012-09-13 – 2012-09-15 (×9): 1 mg via INTRAVENOUS
  Filled 2012-09-13 (×9): qty 1

## 2012-09-13 MED ORDER — HEPARIN SODIUM (PORCINE) 5000 UNIT/ML IJ SOLN
5000.0000 [IU] | Freq: Three times a day (TID) | INTRAMUSCULAR | Status: DC
  Administered 2012-09-13 – 2012-09-15 (×7): 5000 [IU] via SUBCUTANEOUS
  Filled 2012-09-13 (×9): qty 1

## 2012-09-13 MED ORDER — MORPHINE SULFATE 4 MG/ML IJ SOLN: 4.0000 mg | Freq: Once | INTRAMUSCULAR | Status: DC

## 2012-09-13 MED ORDER — INFLUENZA VIRUS VACC SPLIT PF IM SUSP
0.5000 mL | INTRAMUSCULAR | Status: AC
  Administered 2012-09-14: 0.5 mL via INTRAMUSCULAR
  Filled 2012-09-13: qty 0.5

## 2012-09-13 NOTE — ED Notes (Signed)
Admitting Md at the bedside 

## 2012-09-13 NOTE — ED Provider Notes (Signed)
History     CSN: [id number]  Arrival date & time 09/13/12  0440   First MD Initiated Contact with Patient 09/13/12 [phone]      Chief Complaint  Patient presents with  . Hand Pain  . Foot Pain  . Joint Swelling    (Consider location/radiation/quality/duration/timing/severity/associated sxs/prior treatment) HPI Comments: Pt thinks he has a h/o lupus although D/C note from July 2013 suggests that she does not, reports acute exacerbation of pain in feet, soles, both knees, hands and some other joints, much worse recently.  She normally goes to [facility], but hasn't been since August due to it closing.  Pt has been on prednisone, neurontin, reports taking them with no relief in this pain.  No fevers, no direct trauma.  Pt has multiple skin lesions which she notes and prior notes suggest that she has a vasculitis responsible for them.  No CP, SOB, HA, sitff neck.  She has been taking her own OTC meds as well with no relief.  Pt admits she used to do drugs, reports no drugs other than prescriptions in past 1 month.    Patient is a 41 y.o. female presenting with hand pain and lower extremity pain. The history is provided by the patient.  Hand Pain Pertinent negatives include no abdominal pain and no shortness of breath.  Foot Pain Pertinent negatives include no abdominal pain and no shortness of breath.    Past Medical History  Diagnosis Date  . Depression   . Chronic pain   . Leukocytoclastic vasculitis   . Tobacco abuse   . Lupus   . Lupus     Past Surgical History  Procedure Date  . Cholecystectomy   . Tubal ligation   . Ankle surgery     History reviewed. No pertinent family history.  History  Substance Use Topics  . Smoking status: Current Every Day Smoker -- 1.0 packs/day    Types: Cigarettes  . Smokeless tobacco: Never Used  . Alcohol Use: No    OB History    Grav Para Term Preterm Abortions TAB SAB Ect Mult Living                  Review of Systems    Constitutional: Negative for fever and chills.  HENT: Negative for congestion, rhinorrhea and sinus pressure.   Respiratory: Negative for cough, chest tightness and shortness of breath.   Gastrointestinal: Negative for nausea, vomiting, abdominal pain and diarrhea.  Genitourinary: Negative for dysuria, frequency, hematuria, flank pain and genital sores.  Musculoskeletal: Positive for joint swelling and arthralgias. Negative for back pain.  Neurological: Negative for weakness and numbness.  All other systems reviewed and are negative.    Allergies  Review of patient's allergies indicates no known allergies.  Home Medications   Current Outpatient Rx  Name Route Sig Dispense Refill  . ACETAMINOPHEN 325 MG PO TABS Oral Take by mouth every 6 (six) hours as needed. For pain    . CLONAZEPAM 1 MG PO TABS Oral Take 1 tablet (1 mg total) by mouth 2 (two) times daily. 30 tablet 1  . DIPHENHYDRAMINE-APAP (SLEEP) 25-500 MG PO TABS Oral Take 1 tablet by mouth at bedtime as needed. For pain    . GABAPENTIN 300 MG PO CAPS Oral Take 1 capsule (300 mg total) by mouth 3 (three) times daily. 90 capsule 2  . IBUPROFEN 200 MG PO TABS Oral Take 400 mg by mouth every 6 (six) hours as needed. For pain    .  PREDNISONE 10 MG PO TABS Oral Take 1 tablet (10 mg total) by mouth daily. 30 tablet 2  . RISPERIDONE 0.5 MG PO TABS Oral Take 1 tablet (0.5 mg total) by mouth at bedtime. 30 tablet 0  . TRAMADOL-ACETAMINOPHEN 37.5-325 MG PO TABS Oral Take 2 tablets by mouth every 6 (six) hours as needed. For pain    . TRAMADOL-ACETAMINOPHEN 37.5-325 MG PO TABS  2 tabs po QID prn pain 16 tablet 0    BP 167/118  Pulse 109  Temp 97.8 F (36.6 C) (Oral)  Resp 22  Ht 5\' 3"  (1.6 m)  Wt 200 lb (90.719 kg)  BMI 35.43 kg/m2  SpO2 98%  LMP 08/23/2012  Physical Exam  Nursing note and vitals reviewed. Constitutional: She appears well-developed and well-nourished. She is cooperative. She appears distressed.  HENT:  Head:  Normocephalic and atraumatic.  Neck: Normal range of motion and full passive range of motion without pain. No rigidity. No mass present.    Cardiovascular: Normal rate and regular rhythm.   No murmur heard. Pulmonary/Chest: Effort normal. No respiratory distress.  Musculoskeletal: She exhibits tenderness.       Right knee: She exhibits deformity. She exhibits normal range of motion, no swelling, no laceration and no erythema.       Left knee: She exhibits deformity. She exhibits normal range of motion and no swelling.       Right ankle: She exhibits swelling. She exhibits normal range of motion, no ecchymosis, no deformity and no laceration. No lateral malleolus, no medial malleolus, no posterior TFL and no head of 5th metatarsal tenderness found. Achilles tendon normal.       Right hand: She exhibits decreased range of motion and tenderness.       Multiple areas of excoriation, no discharge, fluctuance  Neurological: She is alert.  Skin: Skin is warm. Lesion noted. No bruising, no ecchymosis, no laceration, no petechiae and no rash noted. She is not diaphoretic.  Psychiatric: Her mood appears anxious. Her speech is not delayed and not slurred. Cognition and memory are impaired. She is communicative.    ED Course  Procedures (including critical care time)  Labs Reviewed  CBC WITH DIFFERENTIAL - Abnormal; Notable for the following:    WBC 18.4 (*)     Hemoglobin 15.1 (*)     Neutrophils Relative 86 (*)     Neutro Abs 15.8 (*)     Lymphocytes Relative 10 (*)     All other components within normal limits  BASIC METABOLIC PANEL - Abnormal; Notable for the following:    Glucose, Bld 126 (*)     All other components within normal limits  URINE RAPID DRUG SCREEN (HOSP PERFORMED) - Abnormal; Notable for the following:    Benzodiazepines POSITIVE (*)     Amphetamines POSITIVE (*)     All other components within normal limits  URINALYSIS, ROUTINE W REFLEX MICROSCOPIC - Abnormal; Notable for  the following:    Color, Urine AMBER (*)  BIOCHEMICALS MAY BE AFFECTED BY COLOR   APPearance CLOUDY (*)     Specific Gravity, Urine >1.046 (*)     Bilirubin Urine SMALL (*)     Protein, ur 30 (*)     All other components within normal limits  URINE MICROSCOPIC-ADD ON - Abnormal; Notable for the following:    Squamous Epithelial / LPF FEW (*)     All other components within normal limits  SEDIMENTATION RATE   No results found.   1.  Arthralgia   2. Numbness   3. Vasculitis     ra sat is 98% and in interpret to be normal.  7:33 AM Pt still uncomfortable, reporting 10/10 pain, still tachycardic and HTN suggestive of being in sig pain.  third dose of morphine given.    7:42 AM Spoke to [facility] resident who will see pt.  MDM  Unclear to me if pt truly has lupus.  Seems less likely after reading D/C summary from July.   Pt's elevated WBC may be due to pain and fever.  For now, will treat pain, check sed rate, continue to monitor for pain improvement.  Pt likely with acute on chronic exacerbation for non specific rheumatologic disorder.  If pain unable to be controlled, sed rate is very high, will consider admission for pain control, further IV steroids.          Gavin Pound. Oletta Lamas, MD 09/13/12 [phone]

## 2012-09-13 NOTE — ED Notes (Signed)
Patient reports onset of swelling/knots on her feet and hands since 2100 today.  Patient unsure if this is her lupus or if she has had an allergic reaction to something.  She denies any other complaints.
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
## 2012-09-13 NOTE — ED Notes (Signed)
Patient calling out for more pain medication.
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
## 2012-09-13 NOTE — H&P (Signed)
Hospital Admission Note Date: 09/13/2012  Patient name: [name] Medical record number: [id number] Date of birth: Feb 15, 1971 Age: 41 y.o. Gender: female PCP: Yisroel Ramming, MD  Medical Service: Internal Medicine Teaching Service-[facility]  Attending physician: Dr. Ninetta Lights    1st Contact: Malachi Carl (Medical Student) Pager: [phone] 2nd Contact: Dr. Virgina Organ    Pager: [phone] 3rd Contact: Dr. Loistine Chance    Pager: [phone] After 5 pm or weekends: 1st Contact:      Pager: [phone] 2nd Contact:      Pager: [phone]  Chief Complaint: joint pain, rash  History of Present Illness: [name] is a 41 yo woman with PMH of leukocytoclastic vasculitis (negative work up for lupus in the past, +ANA, -dsDNA, + myeloperoxidase and pANCA) who presents today for complaints of rash and joint pain x2 days.  She claims the burning pain in her feet and lesions on her extremities started last night around 9pm.  She took tylenol pm to help with pain to no relief and continues to take 20mg  prednisone daily along with neurontin.  [name] claims her last use of drugs was one month ago with cocaine and marijuana but denies any other illicit drug use or amphetamines.  Urine toxicology today was positive for benzos and amphetamines.  She was very tearful during our visit, claims her feet feel like they are on fire, and has nodules erupting on lateral surfaces of b/l feet and red lesions on toes, b/l upper extremities, and buttocks.  [name] has had multiple similar hospital admissions and ER visits; the last ER visit was 08/25/22 and last hospital admission was on 07/15/12 to 07/20/12.  She has been treated with IV antibiotics for MSSA wound cultures (July 2013) and hx of MRSA 2008, and claims to take prednisone and neurontin to control flares daily.  She is a previous [facility] patient was supposed to follow up with [facility] Rheumatology and dermatology, however, failed to do so claiming she cannot afford it and her [facility] card  does not work.  She denies any change in her regular routine, no change in shampoo or cleaning agents, no pets or recent travel history, and denies any fever, chills, N/V, abd pain, SOB, chest pain, or fatigue, blood in urine. She denies any suicidal or homicidal ideation at this time.  She has an increase in urinary frequency over the past 3 years as well.    Meds: Current Outpatient Rx  Name Route Sig Dispense Refill  . ACETAMINOPHEN 325 MG PO TABS Oral Take by mouth every 6 (six) hours as needed. For pain    . CLONAZEPAM 1 MG PO TABS Oral Take 1 tablet (1 mg total) by mouth 2 (two) times daily. 30 tablet 1  . DIPHENHYDRAMINE-APAP (SLEEP) 25-500 MG PO TABS Oral Take 1 tablet by mouth at bedtime as needed. For pain    . GABAPENTIN 300 MG PO CAPS Oral Take 1 capsule (300 mg total) by mouth 3 (three) times daily. 90 capsule 2  . IBUPROFEN 200 MG PO TABS Oral Take 400 mg by mouth every 6 (six) hours as needed. For pain    . PREDNISONE 10 MG PO TABS Oral Take 1 tablet (10 mg total) by mouth daily. 30 tablet 2  . RISPERIDONE 0.5 MG PO TABS Oral Take 1 tablet (0.5 mg total) by mouth at bedtime. 30 tablet 0  . TRAMADOL-ACETAMINOPHEN 37.5-325 MG PO TABS Oral Take 2 tablets by mouth every 6 (six) hours as needed. For pain    .  TRAMADOL-ACETAMINOPHEN 37.5-325 MG PO TABS  2 tabs po QID prn pain 16 tablet 0   Allergies: Allergies as of 09/13/2012  . (No Known Allergies)   Past Medical History  Diagnosis Date  . Depression   . Chronic pain   . Leukocytoclastic vasculitis   . Tobacco abuse   . Lupus   . Lupus    Past Surgical History  Procedure Date  . Cholecystectomy   . Tubal ligation   . Ankle surgery    History reviewed. No pertinent family history. History   Social History  . Marital Status: Divorced    Spouse Name: N/A    Number of Children: N/A  . Years of Education: N/A   Occupational History  . Not on file.   Social History Main Topics  . Smoking status: Current Every Day  Smoker -- 1.0 packs/day    Types: Cigarettes  . Smokeless tobacco: Never Used  . Alcohol Use: No  . Drug Use: Yes    Special: Cocaine     last used 2 mths ago  . Sexually Active: Yes   Other Topics Concern  . Not on file   Social History Narrative  . No narrative on file   Review of Systems: Pertinent items are noted in HPI.  Physical Exam: Blood pressure 167/118, pulse 109, temperature 97.8 F (36.6 C), temperature source Oral, resp. rate 22, height 5\' 3"  (1.6 m), weight 200 lb (90.719 kg), last menstrual period 08/23/2012, SpO2 98.00%. Vitals reviewed. General: resting in bed, NAD HEENT: PERRLA, EOMI, no scleral icterus, + ulcer like lesion on inner surface of bottom lip, vesicular lesion on right side of tongue Cardiac: Tachycardia, no rubs, murmurs or gallops Pulm: clear to auscultation bilaterally, no wheezes, rales, or rhonchi Abd: soft, nontender, nondistended, BS present Ext: warm and well perfused, no pedal edema, + nodular like lesions on lateral surface of b/l feet and left heel, + diffuse vesicular lesions on different stages of healing on b/l lower and upper extremities, b/l hands, back, and buttocks.  Tender to palpation and noted non-fluctuant swelling on b/l hands--MCPs, b/l knees, and ankles.  +2DP b/l. + surgical scars b/l lower extremities near feet Neuro: tearful, alert and oriented X3, cranial nerves II-XII grossly intact, strength and sensation to light touch equal in bilateral upper and lower extremities.  Lab results: Basic Metabolic Panel:  [facility] 09/13/12 0535  NA 136  K 4.1  CL 102  CO2 20  GLUCOSE 126*  BUN 18  CREATININE 0.68  CALCIUM 9.9  MG --  PHOS --   CBC:  Basename 09/13/12 0535  WBC 18.4*  NEUTROABS 15.8*  HGB 15.1*  HCT 42.9  MCV 84.0  PLT 270   Urine Drug Screen: Drugs of Abuse     Component Value Date/Time   LABOPIA NONE DETECTED 09/13/2012 0548   COCAINSCRNUR NONE DETECTED 09/13/2012 0548   LABBENZ POSITIVE*  09/13/2012 0548   AMPHETMU POSITIVE* 09/13/2012 0548   THCU NONE DETECTED 09/13/2012 0548   LABBARB NONE DETECTED 09/13/2012 0548    Urinalysis:  Basename 09/13/12 0548  COLORURINE AMBER*  LABSPEC >1.046*  PHURINE 5.5  GLUCOSEU NEGATIVE  HGBUR NEGATIVE  BILIRUBINUR SMALL*  KETONESUR NEGATIVE  PROTEINUR 30*  UROBILINOGEN 0.2  NITRITE NEGATIVE  LEUKOCYTESUR NEGATIVE   Imaging results:  Dg Ankle Complete Left  09/13/2012  *RADIOLOGY REPORT*  Clinical Data: Swelling  LEFT ANKLE COMPLETE - 3+ VIEW  Comparison: 07/15/2012  Findings: Previous plate and screw fixation of the distal fibula  and screw fixation of the medial malleolus as before.  Ankle mortise appears intact.  There are small marginal spurs from the distal tibial articular surface and talar dome.  Normal alignment and mineralization.  No fracture, dislocation, or other acute bony abnormality.  IMPRESSION:  1.  Stable postoperative and degenerative changes.  No acute abnormality.   Original Report Authenticated By: Osa Craver, M.D.    Assessment & Plan by Problem:  Acute skin eruption of discoloration, elevations, blisters--unclear etiology but has hx of Leukocytoclastic vasculitis as seen on prior biopsy.  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 Hx of multiple similar hospital admission and MSSA and MRSA.  Lupus workup negative.  Positive myeloperoxidase and Proteinase ANCAsThought to possibly be secondary to Lev[place] in cocaine, however, has been clean of cocaine use in the past and this month and continues to have flares.  Does not follow up with appoints made in the past for rheumatology and dermatology.  On prednisone and neurontin at home. Hx of polysubstance abuse--cocaine and THC. Gonorrhea pcr in the past negative.  -admit to med/surg  -given IV solumedrol 125mg  x1, NS bolus, morphine 4mg  IV x3 in ED -f/u blood cx -wbc on admission: 18.4 -f/u cbc -morphine 1mg  IV Q2H PRN -Toradol  30mg  IV Q6H -consider rheumatology inpatient consult   Depression--tearful throughout visit, noted to have hx of depression.  On Klonopin and Risperdal at home.  Denies suicidal or homicidal ideation.  Frustrated with vasculitis and pain.  Has been evaluated by psychiatry on previous admission.   -continue home regimen klonopin and risperdal -pain control  Polysubstance abuse: hx of cocaine and THC use, denies all other illicit drug use. Denies recent drug use x1 month. Urine tox on admission: positive for amphetamines and benzodiazepines.  08/25/12 urine tox: positive for amphetamines, benzodiazepines, and THC.  -counseling given cessation strongly advised   Diet: Regular DVT Px: Heparin Dispo: pending further clinical improvement  Signed: Darden Palmer 09/13/2012, 8:11 AM

## 2012-09-14 DIAGNOSIS — F329 Major depressive disorder, single episode, unspecified: Secondary | ICD-10-CM

## 2012-09-14 DIAGNOSIS — R21 Rash and other nonspecific skin eruption: Secondary | ICD-10-CM

## 2012-09-14 DIAGNOSIS — M310 Hypersensitivity angiitis: Secondary | ICD-10-CM

## 2012-09-14 LAB — CBC
MCH: 28.4 pg (ref 26.0–34.0)
MCHC: 33 g/dL (ref 30.0–36.0)
RDW: 14.1 % (ref 11.5–15.5)

## 2012-09-14 MED ORDER — ALUM & MAG HYDROXIDE-SIMETH 200-200-20 MG/5ML PO SUSP: 30.0000 mL | ORAL | Status: DC | PRN

## 2012-09-14 MED ORDER — ALUM & MAG HYDROXIDE-SIMETH 200-200-20 MG/5ML PO SUSP
30.0000 mL | Freq: Four times a day (QID) | ORAL | Status: DC | PRN
  Administered 2012-09-14 – 2012-09-15 (×2): 30 mL via ORAL
  Filled 2012-09-14 (×2): qty 30

## 2012-09-14 NOTE — ED Notes (Signed)
Psych social completed and is in EPIC.  Additionally, CSW will ask [facility] CSW Bonnye Fava, LCSW to f/u with pt in am.

## 2012-09-14 NOTE — Progress Notes (Signed)
Medical Student Daily Progress Note  Subjective: [name] was seen at bedside this morning. She stated she had no acute events overnight and the following:  She slept thru the night and is doing much better this morining. The pain on the soles of her feet, recent onset toe nodules, knees, wrists and fingers is greatly diminished, but the lesions on her buttocks are still painful. She denies N/V/HA/ABD pain or any new symptoms.  Objective: Vital signs in last 24 hours: Filed Vitals:   09/13/12 0702 09/13/12 1356 09/13/12 2200 09/14/12 0504  BP: 167/118 127/75 127/59 101/58  Pulse: 109 105 77 75  Temp:  97.5 F (36.4 C) 98.3 F (36.8 C) 98 F (36.7 C)  TempSrc:  Oral Oral   Resp: 22 20 20 19   Height:      Weight:      SpO2: 98% 97% 99% 97%   Weight change:  No intake or output data in the 24 hours ending 09/14/12 0854 Physical Exam: BP 101/58  Pulse 75  Temp 98 F (36.7 C) (Oral)  Resp 19  Ht 5\' 3"  (1.6 m)  Wt 90.719 kg (200 lb)  BMI 35.43 kg/m2  SpO2 97%  LMP 08/23/2012  General Appearance:    Alert, cooperative, no distress  Head:    Normocephalic, atraumatic  Eyes:    conjunctiva/corneas clear, EOM's intact  Ears:    Normal TM's and external ear canals, both ears  Nose:   Nares normal, septum midline, mucosa normal, no drainage    or sinus tenderness  Throat:   Lips, mucosa  Neck:   Supple, symmetrical, trachea midline  Back:     Symmetric, no curvature  Lungs:     Clear to auscultation bilaterally, respirations unlabored   Heart:    Regular rate and rhythm, no murmur  Abdomen:     Soft, non-tender, no masses  Extremities:   Extremities atraumatic, no cyanosis or edema  Positive for:  Feet: 2-3cm indurated swelling on medial aspect of L foot near the calcaneous has REDUCED TURGOR compared  LESS red. Soles of feet are LESS erythematous, both these and the skin lesions are NO LONGER painful to light touch. Increased pressure continues to elicit pain.  Knees:  swollen but NO LONGER warm to touch, with smooth glassy appearance   Wrists: Bilateral distal ulnar head nodules have REDUCED IN SIZE since yesterday, some swelling remains. Still painful to deep palparion. Bilateral 1st and 4th digit MCP joints with nodules still somewhat swollen, though REDUCED from yesterday.   Pulses:   2+ and symmetric all extremities  Skin:   Skin color, turgor normal  Positive for: the small lesions in upper and lower extremities are no longer bullous. Buttocks still have numerous non-bullous erythematous nodules. Large number of scars and ulcers that appear to be in various stages of resolution from these original lesions.  Lymph nodes:   Cervical, supraclavicular, and axillary nodes normal  Neurologic:   AAOX3, Negative for focal deficits   Lab Results: Basic Metabolic Panel:  Lab 09/13/12 1610  NA 136  K 4.1  CL 102  CO2 20  GLUCOSE 126*  BUN 18  CREATININE 0.68  CALCIUM 9.9  MG --  PHOS --   CBC:  Lab 09/14/12 0525 09/13/12 0535  WBC 9.7 18.4*  NEUTROABS -- 15.8*  HGB 12.2 15.1*  HCT 37.0 42.9  MCV 86.2 84.0  PLT 252 270   Hemoglobin A1C:  Lab 09/13/12 1240  HGBA1C 5.5   Urine  Drug Screen: Drugs of Abuse     Component Value Date/Time   LABOPIA NONE DETECTED 09/13/2012 0548   COCAINSCRNUR NONE DETECTED 09/13/2012 0548   LABBENZ POSITIVE* 09/13/2012 0548   AMPHETMU POSITIVE* 09/13/2012 0548   THCU NONE DETECTED 09/13/2012 0548   LABBARB NONE DETECTED 09/13/2012 0548    Urinalysis:  Lab 09/13/12 0548  COLORURINE AMBER*  LABSPEC >1.046*  PHURINE 5.5  GLUCOSEU NEGATIVE  HGBUR NEGATIVE  BILIRUBINUR SMALL*  KETONESUR NEGATIVE  PROTEINUR 30*  UROBILINOGEN 0.2  NITRITE NEGATIVE  LEUKOCYTESUR NEGATIVE   Studies/Results: Dg Ankle Complete Left  09/13/2012  *RADIOLOGY REPORT*  Clinical Data: Swelling  LEFT ANKLE COMPLETE - 3+ VIEW  Comparison: 07/15/2012  Findings: Previous plate and screw fixation of the distal fibula and screw  fixation of the medial malleolus as before.  Ankle mortise appears intact.  There are small marginal spurs from the distal tibial articular surface and talar dome.  Normal alignment and mineralization.  No fracture, dislocation, or other acute bony abnormality.  IMPRESSION:  1.  Stable postoperative and degenerative changes.  No acute abnormality.   Original Report Authenticated By: Osa Craver, M.D.    Medications: I have reviewed the patient's current medications. Scheduled Meds:   . clonazePAM  1 mg Oral BID  . gabapentin  300 mg Oral TID  . heparin  5,000 Units Subcutaneous Q8H  . influenza  inactive virus vaccine  0.5 mL Intramuscular Tomorrow-1000  . ketorolac  30 mg Intravenous Q6H  . pneumococcal 23 valent vaccine  0.5 mL Intramuscular Tomorrow-1000  . predniSONE  60 mg Oral Q breakfast  . risperiDONE  0.5 mg Oral QHS  . DISCONTD: predniSONE  10 mg Oral Q breakfast   Continuous Infusions:  PRN Meds:.diphenhydrAMINE, morphine injection  Assessment/Plan: [name] complaints of new lesions on the arms and buttocks that appear consistent with her previous symptoms. It is likely she is having a flare of her systemic vasculitis that is presently resolving. Her skin lesions continue to appear not infected at this time, but those on her buttocks are still erupting and causing her pain. Her leukocytosis has resolved. Her foot pain has reduced with gabapentin. Given her history of flares and not fully differentiated autoimmune symptoms, she will need a rheumatology consult.   Active Problems:  Leukocytoclastic vasculitis  Pain appears well controlled for now, need to determine degree of resolution -D/C morphine @ 1100  -continue ketorolac -inflammation symptoms are reducing - continue PO Prednisone  Leukocytosis-  WBC 9.7 this am @0525  and vitals WNL -no further diagnostic evaluation needed Foot and joint pain  -continue Gabapentin, PO 3xDaily  -ketorolac IV Q6 as per  above for vasculitis Acute skin eruption of discoloration, elevations, blisters  Itching and burning sx are resolving -continue PO diphenhydramine capsule 25mg  PO PRN  For all AI symptoms: We are working to find a rheumatology consult for her, possibly at [facility].  Depression hx of depression, on risperdal and klonipin at home -continue home regimen  -encourage followup with outpaitent PCP  Polysubstance abuse- hx of cocaine, thc, and recent urine tox positive for amphetamines  -last use of cocaine reported to be one month prior  -cessation advised, counseling given   DIET: Heart Healthy Diet  DVT Prophylaxis: heparin  This is a Psychologist, occupational Note.  The care of the patient was discussed with Dr. Loistine Chance and the assessment and plan formulated with their assistance.  Please see their attached note for official documentation of the daily encounter.  [name] 09/14/2012, 8:54 AM  PGY-1 Note Amendment :  I have reviewed and agree with Medical student, [name] note. Please see my separate note for corrections and changes.   Darden Palmer  09/14/12 11:15 AM

## 2012-09-14 NOTE — Progress Notes (Signed)
   CARE MANAGEMENT NOTE 09/14/2012  Patient:  [name], [name]   Account Number:  [id number]  Date Initiated:  09/13/2012  Documentation initiated by:  Vance Peper  Subjective/Objective Assessment:   41 yr old female admitted for joint pain/swelling     Action/Plan:   sister, Laurence Slate # [phone]   Anticipated DC Date:  09/14/2012   Anticipated DC Plan:  HOME/SELF CARE      DC Planning Services  CM consult  Indigent Health Clinic  Medication Assistance      Choice offered to / List presented to:             Status of service:  Completed, signed off Medicare Important Message given?   (If response is "NO", the following Medicare IM given date fields will be blank) Date Medicare IM given:   Date Additional Medicare IM given:    Discharge Disposition:  HOME/SELF CARE  Per UR Regulation:    If discussed at Long Length of Stay Meetings, dates discussed:    Comments:  09/14/2012 1045 NCM spoke to Dr Round and request to transportation to [facility] to [facility] on 10/28. NCM contacted CSW and SW will follow up on options for transportation to appt to clinic. NCM spoke to pt and states she is willing to follow up with clinic for further treatment. She lives with her sister, Raynelle Fanning. Raynelle Fanning assist her with getting medications. She gets her meds from [facility]. She pays $4 for her Prednisone and $7 for Klonopin. At this time, will not utilize the [employer] med fund for meds. Pt is able to pay for meds. Pt is eligible for [employer] fund from main pharmacy. She has applied for disability and was denied twice. She is currently working with attorney. She has applied for Medicaid. She states she was going to [facility] and will need a new PCP. NCM provided pt with list of Primary Care Resources in [facility] that accept self pay. Provided pt with community discount card that may assist pt with out of pocket meds. Isidoro Donning RN CCM Case Mgmt phone [phone]

## 2012-09-14 NOTE — Progress Notes (Signed)
Clinical Social Work Department BRIEF PSYCHOSOCIAL ASSESSMENT 09/14/2012  Patient:  [name], [name]     Account Number:  [id number]     Admit date:  09/13/2012  Clinical Social Worker:  Illene Silver  Date/Time:  09/14/2012 12:08 PM  Referred by:  Physician  Date Referred:  09/14/2012 Referred for  Transportation assistance  Crisis Intervention   Other Referral:   Interview type:  Patient Other interview type:    PSYCHOSOCIAL DATA Living Status:  FAMILY Admitted from facility:   Level of care:   Primary support name:   Primary support relationship to patient:  FAMILY Degree of support available:   Pt lives with her sister who is very supportive of pt. Sister pays all bills and helps pt out with medication costs  when she can.    CURRENT CONCERNS Current Concerns  Adjustment to Illness  Financial Resources   Other Concerns:   Transportaion to md appt in [place] when scheduled.  PC f/u and med assist, per CM.    SOCIAL WORK ASSESSMENT / PLAN Spoke with pt re: transportation issues.  pt depends on her sisters/dtr for transportation at this time.  pt is disability/medicaid pending and gets food stamps.  Pt wants to go to [facility] in hopes of a diagnosis and believes family will be able to assist with transportation, especially if appt. is on a Monday.  Pt very tearful re: her current situatuon and emotional support was offered.   Assessment/plan status:  Psychosocial Support/Ongoing Assessment of Needs Other assessment/ plan:   Information/referral to community resources:    PATIENT'S/FAMILY'S RESPONSE TO PLAN OF CARE: Pt tearful and tired of being sick.  Hopeful that the specialist in [facility] will be able to help her.

## 2012-09-14 NOTE — Progress Notes (Signed)
Subjective: Ms. Speckman was seen and examined at bedside this morning.  She claims to be feeling better since admission, pain is much improved in her bilateral feet and she is able to walk around.  She does report worsening of rash on her buttocks and itchiness relieved with benadryl.  We have talked to her again in extensive detail in regards to proper outpatient follow up and keeping her appointments.  She claims she will try harder and work on finances to make her appointments.  She has no other complaints at this time.  She denies any N/V/D, fever, chills, abdominal pain, chest pain, or shortness of breath at this time.   Objective: Vital signs in last 24 hours: Filed Vitals:   09/13/12 0702 09/13/12 1356 09/13/12 2200 09/14/12 0504  BP: 167/118 127/75 127/59 101/58  Pulse: 109 105 77 75  Temp:  97.5 F (36.4 C) 98.3 F (36.8 C) 98 F (36.7 C)  TempSrc:  Oral Oral   Resp: 22 20 20 19   Height:      Weight:      SpO2: 98% 97% 99% 97%   Weight change:  No intake or output data in the 24 hours ending 09/14/12 1018 Vitals reviewed. General: resting in bed, NAD  HEENT: PERRLA, EOMI, no scleral icterus, + ulcer like lesion on inner surface of bottom lip, vesicular lesion on right side of tongue  Cardiac: RRR, no rubs, murmurs or gallops  Pulm: clear to auscultation bilaterally, no wheezes, rales, or rhonchi  Abd: soft, nontender, nondistended, BS present  Ext: warm and well perfused, no pedal edema, improved + nodular like lesions on lateral surface of b/l feet and left heel, + diffuse vesicular lesions on different stages of healing on b/l lower and upper extremities, b/l hands, back, and buttocks. Nontender to palpation and noted non-fluctuant swelling on b/l hands--MCPs, b/l knees, and ankles. +2DP b/l. + surgical scars b/l lower extremities near feet  Neuro: alert and oriented X3, cranial nerves II-XII grossly intact, strength and sensation to light touch equal in bilateral upper and lower  extremities. Skin: multiple scattered erythematous small excoriated papules in various stages of healing in buttocks region, b/l upper and lower extremities, and abdomen  Lab Results: Basic Metabolic Panel:  Lab 09/13/12 1478  NA 136  K 4.1  CL 102  CO2 20  GLUCOSE 126*  BUN 18  CREATININE 0.68  CALCIUM 9.9  MG --  PHOS --   CBC:  Lab 09/14/12 0525 09/13/12 0535  WBC 9.7 18.4*  NEUTROABS -- 15.8*  HGB 12.2 15.1*  HCT 37.0 42.9  MCV 86.2 84.0  PLT 252 270   Hemoglobin A1C:  Lab 09/13/12 1240  HGBA1C 5.5   Urine Drug Screen: Drugs of Abuse     Component Value Date/Time   LABOPIA NONE DETECTED 09/13/2012 0548   COCAINSCRNUR NONE DETECTED 09/13/2012 0548   LABBENZ POSITIVE* 09/13/2012 0548   AMPHETMU POSITIVE* 09/13/2012 0548   THCU NONE DETECTED 09/13/2012 0548   LABBARB NONE DETECTED 09/13/2012 0548    Urinalysis:  Lab 09/13/12 0548  COLORURINE AMBER*  LABSPEC >1.046*  PHURINE 5.5  GLUCOSEU NEGATIVE  HGBUR NEGATIVE  BILIRUBINUR SMALL*  KETONESUR NEGATIVE  PROTEINUR 30*  UROBILINOGEN 0.2  NITRITE NEGATIVE  LEUKOCYTESUR NEGATIVE   Studies/Results: Dg Ankle Complete Left  09/13/2012  *RADIOLOGY REPORT*  Clinical Data: Swelling  LEFT ANKLE COMPLETE - 3+ VIEW  Comparison: 07/15/2012  Findings: Previous plate and screw fixation of the distal fibula and screw fixation of  the medial malleolus as before.  Ankle mortise appears intact.  There are small marginal spurs from the distal tibial articular surface and talar dome.  Normal alignment and mineralization.  No fracture, dislocation, or other acute bony abnormality.  IMPRESSION:  1.  Stable postoperative and degenerative changes.  No acute abnormality.   Original Report Authenticated By: Osa Craver, M.D.    Medications: I have reviewed the patient's current medications. Scheduled Meds:   . clonazePAM  1 mg Oral BID  . gabapentin  300 mg Oral TID  . heparin  5,000 Units Subcutaneous Q8H  .  influenza  inactive virus vaccine  0.5 mL Intramuscular Tomorrow-1000  . ketorolac  30 mg Intravenous Q6H  . pneumococcal 23 valent vaccine  0.5 mL Intramuscular Tomorrow-1000  . predniSONE  60 mg Oral Q breakfast  . risperiDONE  0.5 mg Oral QHS  . DISCONTD: predniSONE  10 mg Oral Q breakfast   Continuous Infusions:  PRN Meds:.alum & mag hydroxide-simeth, alum & mag hydroxide-simeth, diphenhydrAMINE, morphine injection  Assessment/Plan: Ms. Getting is a 41 year old female with PMH significant for prior biopsy of Leukocytoclastic vasculitis, MSSA, MRSA, depression, and polysubstance abuse admitted for acute rash eruption with red discoloration and blistering along with joint pain.  She has had multiple similar hospital admissions in the past, negative work up for Lupus.  Fails to follow up with rheumatology and dermatology appointments  Acute skin eruption of discoloration, elevations, blisters--unclear etiology but has hx of Leukocytoclastic vasculitis as seen on prior biopsy.  Positive myeloperoxidase and Proteinase ANCAs.  On prednisone and neurontin at home. Hx of polysubstance abuse--cocaine and THC. Gonorrhea pcr in the past negative.   -f/u blood cx  -wbc on admission: 18.4-->9.7  -morphine 1mg  IV Q2H PRN  -Toradol 30mg  IV Q6H  -consider rheumatology inpatient consult or outpatient follow up [facility] rheumatology if possible, will consult case manager and social work as needed  Depression--noted to have hx of depression. On Klonopin and Risperdal at home. Denies suicidal or homicidal ideation. Frustrated with vasculitis and pain. Has been evaluated by psychiatry on previous admission.  -continue home regimen klonopin and risperdal  -pain control   Polysubstance abuse: hx of cocaine and THC use, denies all other illicit drug use. Denies recent drug use x1 month. Urine tox on admission: positive for amphetamines and benzodiazepines. 08/25/12 urine tox: positive for amphetamines,  benzodiazepines, and THC.  -counseling given cessation strongly advised   Diet: Regular  DVT Px: Heparin  Dispo: pending further clinical improvement   LOS: 1 day   [name] 09/14/2012, 10:18 AM

## 2012-09-14 NOTE — Progress Notes (Signed)
Internal Medicine Teaching Service Attending Note Date: 09/14/2012  Patient name: [name]  Medical record number: [id number]  Date of birth: 1971-03-21   I have seen and evaluated [name] and discussed their care with the Residency Team.   Feels better, decreased pain- can walk now. Feels like she has more lesions on her buttocks.  Having some issues with reflux    . clonazePAM  1 mg Oral BID  . gabapentin  300 mg Oral TID  . heparin  5,000 Units Subcutaneous Q8H  . influenza  inactive virus vaccine  0.5 mL Intramuscular Tomorrow-1000  . ketorolac  30 mg Intravenous Q6H  . pneumococcal 23 valent vaccine  0.5 mL Intramuscular Tomorrow-1000  . predniSONE  60 mg Oral Q breakfast  . risperiDONE  0.5 mg Oral QHS  . DISCONTD: predniSONE  10 mg Oral Q breakfast    Physical Exam: Blood pressure 101/58, pulse 75, temperature 98 F (36.7 C), temperature source Oral, resp. rate 19, height 5\' 3"  (1.6 m), weight 90.719 kg (200 lb), last menstrual period 08/23/2012, SpO2 97.00%. General appearance: alert, cooperative and no distress Skin: erythema - scattered, arm(s) bilateral, abdomen, buttock(s) bilateral- multiple small excoriated papules.   Lab results: Results for orders placed during the hospital encounter of 09/13/12 (from the past 24 hour(s))  HEMOGLOBIN A1C     Status: Normal   Collection Time   09/13/12 12:40 PM      Component Value Range   Hemoglobin A1C 5.5  <5.7 %   Mean Plasma Glucose 111  <117 mg/dL  HIV ANTIBODY (ROUTINE TESTING)     Status: Normal   Collection Time   09/13/12  3:06 PM      Component Value Range   HIV NON REACTIVE  NON REACTIVE  HEPATITIS C ANTIBODY     Status: Normal   Collection Time   09/13/12  3:06 PM      Component Value Range   HCV Ab NEGATIVE  NEGATIVE  ANGIOTENSIN CONVERTING ENZYME     Status: Normal   Collection Time   09/13/12  3:06 PM      Component Value Range   Angiotensin-Converting Enzyme 20  8 - 52 U/L  CBC     Status:  Normal   Collection Time   09/14/12  5:25 AM      Component Value Range   WBC 9.7  4.0 - 10.5 K/uL   RBC 4.29  3.87 - 5.11 MIL/uL   Hemoglobin 12.2  12.0 - 15.0 g/dL   HCT 40.9  81.1 - 91.4 %   MCV 86.2  78.0 - 100.0 fL   MCH 28.4  26.0 - 34.0 pg   MCHC 33.0  30.0 - 36.0 g/dL   RDW 78.2  95.6 - 21.3 %   Platelets 252  150 - 400 K/uL    Imaging results:  Dg Ankle Complete Left  09/13/2012  *RADIOLOGY REPORT*  Clinical Data: Swelling  LEFT ANKLE COMPLETE - 3+ VIEW  Comparison: 07/15/2012  Findings: Previous plate and screw fixation of the distal fibula and screw fixation of the medial malleolus as before.  Ankle mortise appears intact.  There are small marginal spurs from the distal tibial articular surface and talar dome.  Normal alignment and mineralization.  No fracture, dislocation, or other acute bony abnormality.  IMPRESSION:  1.  Stable postoperative and degenerative changes.  No acute abnormality.   Original Report Authenticated By: Osa Craver, M.D.     Assessment  and Plan: I agree with the formulated Assessment and Plan with the following changes:  Leukocytoclastic vasculitis of ? Etiology Plan for her to continue prednisone taper F/u in IM clinic and ultimately will f/u in rheum. I stressed the importance of this eval to her and she is committed to making f/u appt.

## 2012-09-15 MED ORDER — DIPHENHYDRAMINE HCL 25 MG PO CAPS: 25.0000 mg | ORAL_CAPSULE | Freq: Four times a day (QID) | ORAL | Status: DC | PRN

## 2012-09-15 MED ORDER — CLONAZEPAM 1 MG PO TABS: 1.0000 mg | ORAL_TABLET | Freq: Two times a day (BID) | ORAL | Status: DC

## 2012-09-15 MED ORDER — NAPROXEN 500 MG PO TABS: 500.0000 mg | ORAL_TABLET | Freq: Two times a day (BID) | ORAL | Status: DC

## 2012-09-15 NOTE — Discharge Summary (Signed)
Internal Medicine Teaching [facility] Discharge Note  Name: [name] MRN: [id number] DOB: [birth year] 41 y.o.  Date of Admission: 09/13/2012  4:41 AM Date of Discharge: 09/15/2012 Attending Physician: Dr. Doneen Poisson, MD Discharge Diagnosis: Principal Problem:  *Acute skin eruption of discoloration, elevations, blisters Active Problems:  Leukocytoclastic vasculitis  Leukocytosis  Depression  Polysubstance abuse  Discharge Medications:   Medication List     As of 09/15/2012  4:50 PM    STOP taking these medications         acetaminophen 325 MG tablet   Commonly known as: TYLENOL      TAKE these medications         clonazePAM 1 MG tablet   Commonly known as: KLONOPIN   Take 1 tablet (1 mg total) by mouth 2 (two) times daily.      diphenhydrAMINE 25 mg capsule   Commonly known as: BENADRYL   Take 1 capsule (25 mg total) by mouth every 6 (six) hours as needed for itching.      diphenhydramine-acetaminophen 25-500 MG Tabs   Commonly known as: TYLENOL PM   Take 1 tablet by mouth at bedtime as needed. For pain      gabapentin 300 MG capsule   Commonly known as: NEURONTIN   Take 1 capsule (300 mg total) by mouth 3 (three) times daily.      ibuprofen 200 MG tablet   Commonly known as: ADVIL,MOTRIN   Take 400 mg by mouth every 6 (six) hours as needed. For pain      naproxen 500 MG tablet   Commonly known as: NAPROSYN   Take 1 tablet (500 mg total) by mouth 2 (two) times daily with a meal.      predniSONE 10 MG tablet   Commonly known as: DELTASONE   Take 1 tablet (10 mg total) by mouth daily.      risperiDONE 0.5 MG tablet   Commonly known as: RISPERDAL   Take 1 tablet (0.5 mg total) by mouth at bedtime.      traMADol-acetaminophen 37.5-325 MG per tablet   Commonly known as: ULTRACET   2 tabs po QID prn pain      traMADol-acetaminophen 37.5-325 MG per tablet   Commonly known as: ULTRACET   Take 2 tablets by mouth every 6 (six) hours as needed. For  pain       Disposition and follow-up:   ShelbyAzariya L Khanam was discharged from [facility] in stable condition.  At the hospital follow up visit please address rash and joint pain--rheumatology and anxiety and depression along with medication adjustment.    Follow-up Appointments:     Follow-up Information    Follow up with PATEL,RAVI, MD. On 09/22/2012. (AT 3PM )    Contact information:   [address] [place] [address] [phone]       Schedule an appointment as soon as possible for a visit with [facility] RHEUMATOLOGY. (AS SOON AS POSSIBLE)    Contact information:   [phone] [address], [place], [place] [address]        Discharge Orders    Future Appointments: Provider: Department: Dept Phone: Center:   09/22/2012 3:00 PM Sunday Spillers, MD [facility] [phone] [facility]     Future Orders Please Complete By Expires   Diet - low sodium heart healthy      Increase activity slowly      Discharge instructions      Comments:  Please follow up with [facility] Rheumatology as soon as possible as we discussed  Follow up with your pcp  Follow up for hospital follow up visit in 1-2 weeks in opc   Call MD for:  severe uncontrolled pain      Call MD for:  redness, tenderness, or signs of infection (pain, swelling, redness, odor or green/yellow discharge around incision site)        Procedures Performed:  Dg Ankle Complete Left  09/13/2012  *RADIOLOGY REPORT*  Clinical Data: Swelling  LEFT ANKLE COMPLETE - 3+ VIEW  Comparison: 07/15/2012  Findings: Previous plate and screw fixation of the distal fibula and screw fixation of the medial malleolus as before.  Ankle mortise appears intact.  There are small marginal spurs from the distal tibial articular surface and talar dome.  Normal alignment and mineralization.  No fracture, dislocation, or other acute bony abnormality.  IMPRESSION:  1.  Stable postoperative and degenerative changes.  No acute  abnormality.   Original Report Authenticated By: Osa Craver, M.D.    Admission HPI: [name] is a 41 yo woman with PMH of leukocytoclastic vasculitis (negative work up for lupus in the past, +ANA, -dsDNA, + myeloperoxidase and pANCA) who presents today for complaints of rash and joint pain x2 days. She claims the burning pain in her feet and lesions on her extremities started last night around 9pm. She took tylenol pm to help with pain to no relief and continues to take 20mg  prednisone daily along with neurontin. [name] claims her last use of drugs was one month ago with cocaine and marijuana but denies any other illicit drug use or amphetamines. Urine toxicology today was positive for benzos and amphetamines. She was very tearful during our visit, claims her feet feel like they are on fire, and has nodules erupting on lateral surfaces of b/l feet and red lesions on toes, b/l upper extremities, and buttocks. [name] has had multiple similar hospital admissions and ER visits; the last ER visit was 08/25/22 and last hospital admission was on 07/15/12 to 07/20/12. She has been treated with IV antibiotics for MSSA wound cultures (July 2013) and hx of MRSA 2008, and claims to take prednisone and neurontin to control flares daily. She is a previous [facility] patient was supposed to follow up with [facility] Rheumatology and dermatology, however, failed to do so claiming she cannot afford it and her [facility] card does not work. She denies any change in her regular routine, no change in shampoo or cleaning agents, no pets or recent travel history, and denies any fever, chills, N/V, abd pain, SOB, chest pain, or fatigue, blood in urine. She denies any suicidal or homicidal ideation at this time. She has an increase in urinary frequency over the past 3 years as well.   Hospital Course by problem list: Principal Problem:  Acute skin eruption of discoloration, elevations, blisters and joint pain--presented  with complaints of erupting rash and joint pain x2 days in various stages of healing in b/l upper and lower extremities and buttock region.  Unclear etiology but has hx of Leukocytoclastic vasculitis as seen on prior biopsy. 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 Ms. Lebeck has a hx of multiple similar hospital admissions and MSSA and MRSA. Lupus workup in the past has been negative.  Positive myeloperoxidase and Proteinase ANCAs, HIV and HCV, and ACE negative, cryoglobulin pending. Gonorrhea pcr in the past negative.    Ms. Fryar has failed to follow up  with appointments made in the past for rheumatology and dermatology. She claims she cannot afford them nor does she have available transportation.  She is on chornic prednisone and neurontin at home. She also has a hx of polysubstance abuse--cocaine and THC.    During the course of her admission, the rash and pain improved, especially in her b/l feet and she was comfortable walking again.  She was initially given IV solumedrol in the emergency room and pain control was maintained throughout admission with morphine and toradol along with prednisone.  Also given naproxen on discharge for pain control.  Wbc on admission was noted to be 18.4, improved to 9.7, afebrile. Blood cultures x2 no growth to date.  Left ankle xray stable postoperative and degenerative changes.  No acute abnormality.  She also gained relief from benadryl.  She is asked to follow up with [facility] rheumatology clinic that has been arranged for her.  Her paperwork has been faxed and they will contact her in regards to a visit appointment.  In case she does not hear from them, we have also provided her their information to call and make an appointment.  She is also advised to establish a primary care physician in the community for regular and long term follow up for her medical needs.  She is scheduled for hospital follow up visit in [facility] 09/22/12 at 3pm.  We have  discussed with her in detail the importance of her attending her appointments and seeing rheumatology.  She is in agreement and claims she is sick of her rash and will do her best to make it the clinic.  Case management and social work were also consulted during this admission to assist with transportation issues.     Depression--hx of depression.  On klonopin and risperdal at home.  Denies suicidal or homicidal ideation but tearful during admission, anxious at times, and frustrated in regards to her rash, pain, and social factors.  Kept on klonopin and risperdal during admission.  She has been evaluated by psychiatry in the past and is encouraged to follow up with psychiatry or [facility] as an outpatient as needed and to also establish primary care for medication adjustments as needed.  She claims to be out of her klonopin which was refilled on discharge with limited supply until she is seen in opc and primary care is established.     Polysubstance abuse--Ms. Freeman has a hx of cocaine and THC use and denies all other illicit drug use. She claims her most recent drug use was over one month ago with cocaine and THC but denies any use of amphetamines. Urine tox on admission was positive for amphetamines and benzodiazepines. 08/25/12 urine tox: positive for amphetamines, benzodiazepines, and THC. Ms. Calia has been counseled extensively on cessation of all illicit drug use and encouraged to continue to refrain from all drugs.  She is in agreement and hopes to stay clean.    Discharge Vitals:  BP 136/68  Pulse 90  Temp 98.2 F (36.8 C) (Oral)  Resp 16  Ht 5\' 3"  (1.6 m)  Wt 200 lb (90.719 kg)  BMI 35.43 kg/m2  SpO2 98%  LMP 08/23/2012  Discharge Labs:  Results for GOLDIE, TREGONING (MRN [id number]) as of 09/15/2012 16:43  Ref. Range 09/14/2012 05:25  WBC Latest Range: 4.0-10.5 K/uL 9.7  RBC Latest Range: 3.87-5.11 MIL/uL 4.29  Hemoglobin Latest Range: 12.0-15.0 g/dL 40.9  HCT Latest Range: 36.0-46.0 %  37.0  MCV Latest Range: 78.0-100.0 fL 86.2  MCH Latest Range: 26.0-34.0 pg 28.4  MCHC Latest Range: 30.0-36.0 g/dL 16.1  RDW Latest Range: 11.5-15.5 % 14.1  Platelets Latest Range: 150-400 K/uL 252   Signed: Darden Palmer 09/15/2012, 4:50 PM   Time Spent on Discharge: 45 minutes

## 2012-09-15 NOTE — Progress Notes (Signed)
Internal Medicine Attending  Date: 09/15/2012  Patient name: [name] Medical record number: [id number] Date of birth: Apr 16, 1971 Age: 41 y.o. Gender: female  I saw and evaluated the patient. I reviewed the resident's note by Dr. Virgina Organ and I agree with the resident's findings and plans as documented in her progress note.  I cared for [name] earlier this year and her erythematous rash with pustules is slightly improved.  She is frustrated with her current condition but has not found the ability to make her follow-up subspecialty appointments to help better define her rheumatologic condition.  This admission, work has been done to try to arrange f/u at [facility] with transportation to and from [facility].  I am hopeful she will remain motivated to make this appointment.  The housestaff have counseled her on the need to stop abusing THC, etc.  This could also be playing a role in her flares.  I agree with the plan to discharge her home today with a one time follow-up in the [facility] and follow-up as arranged at [facility].

## 2012-09-15 NOTE — Progress Notes (Signed)
Medical Student Daily Progress Note  Subjective:  Ms. Brogden was seen and examined at bedside this morning. She claims to be feeling better since admission and improved since yesterday. Her pain is much improved in her feet bilaterally; she was able to walk around, take a shower, and dress herself this morning. The rash on her buttocks and itchiness is better than yesterday but is still somewhat painful. We discussed her outpatient followup needs and the importance of going to her appointments. She claims she will pursue the consult that is being arranged for her at [facility] Rheumatology. She has no other complaints at this time. She denies any N/V/D, fever, chills, abdominal pain, chest pain, or shortness of breath at this time.   Objective: Vital signs in last 24 hours: Filed Vitals:   09/14/12 0504 09/14/12 1557 09/14/12 2211 09/15/12 0444  BP: 101/58 106/57 141/75 136/68  Pulse: 75 79 89 90  Temp: 98 F (36.7 C) 98.1 F (36.7 C) 98.9 F (37.2 C) 98.2 F (36.8 C)  TempSrc:  Tympanic Oral Oral  Resp: 19 18 16 16   Height:      Weight:      SpO2: 97% 97% 99% 98%   Weight change:   Intake/Output Summary (Last 24 hours) at 09/15/12 1337 Last data filed at 09/14/12 2100  Gross per 24 hour  Intake    480 ml  Output      0 ml  Net    480 ml   Physical Exam: BP 136/68  Pulse 90  Temp 98.2 F (36.8 C) (Oral)  Resp 16  Ht 5\' 3"  (1.6 m)  Wt 90.719 kg (200 lb)  BMI 35.43 kg/m2  SpO2 98%  LMP 08/23/2012  General Appearance:   Alert, cooperative, no distress   Head:   Normocephalic, atraumatic   Eyes:   Conjunctiva/corneas clear, EOM's intact   Ears:   Normal ear canals, both ears  Nose:  Nares normal, no drainage or sinus tenderness  Throat/Mouth:  Mucosa and tongue normal Positive for: inside of lower lip/gum non-erythematous chanchre  Neck:  Supple, symmetrical, trachea midline   Back:    Symmetric, no curvature Positive for: Continued presence of red/purple lesions on lower  buttocks, with predominance of distribution around gluteal cleft and lower buttocks. No presently erupted lesions, recent lesions are contiguous and overlap with scars of older lesions.  Lungs:    Respirations unlabored, no wheezing or crackles   Heart:   Regular rate and rhythm, S1 and S2 normal, no murmur, rub   or gallop  Abdomen:    Soft, non-tender, no masses   Extremities:  Extremities atraumatic, no cyanosis or edema  Positive for:  Feet: 2-3cm indurated swelling on medial aspect of L foot near the calcaneous has very little turgor compared to admission, and is no longer more red than surrounding skin. Soles of feet also appear consistent with baseline skin tone, both these and the skin lesions are no longer painful to light touch. Increased pressure on nodules continues to elicit pain.   Knees: swollen but NO LONGER warm to touch, with smooth glassy appearance   Wrists: Bilateral distal ulnar head nodules are reduced in size further since yesterday and admission, minimal swelling remains, thought they are still painful to deep palpation and resididual nodules are noticable. Bilateral 1st and 4th digit MCP joints with nodules still somewhat swollen, though reduced more from yesterday and admission.   Pulses:   2+ and symmetric all extremities  Neurologic:  AAOX3, Negative for focal deficits    Lab Results: Basic Metabolic Panel:  Lab 09/13/12 8119  NA 136  K 4.1  CL 102  CO2 20  GLUCOSE 126*  BUN 18  CREATININE 0.68  CALCIUM 9.9  MG --  PHOS --   CBC:  Lab 09/14/12 0525 09/13/12 0535  WBC 9.7 18.4*  NEUTROABS -- 15.8*  HGB 12.2 15.1*  HCT 37.0 42.9  MCV 86.2 84.0  PLT 252 270   Hemoglobin A1C:  Lab 09/13/12 1240  HGBA1C 5.5   Urine Drug Screen: Drugs of Abuse     Component Value Date/Time   LABOPIA NONE DETECTED 09/13/2012 0548   COCAINSCRNUR NONE DETECTED 09/13/2012 0548   LABBENZ POSITIVE* 09/13/2012 0548   AMPHETMU POSITIVE* 09/13/2012 0548   THCU NONE  DETECTED 09/13/2012 0548   LABBARB NONE DETECTED 09/13/2012 0548    Urinalysis:  Lab 09/13/12 0548  COLORURINE AMBER*  LABSPEC >1.046*  PHURINE 5.5  GLUCOSEU NEGATIVE  HGBUR NEGATIVE  BILIRUBINUR SMALL*  KETONESUR NEGATIVE  PROTEINUR 30*  UROBILINOGEN 0.2  NITRITE NEGATIVE  LEUKOCYTESUR NEGATIVE   Micro Results: Recent Results (from the past 240 hour(s))  CULTURE, BLOOD (ROUTINE X 2)     Status: Normal (Preliminary result)   Collection Time   09/13/12 12:40 PM      Component Value Range Status Comment   Specimen Description BLOOD RIGHT ARM   Final    Special Requests BOTTLES DRAWN AEROBIC ONLY 10CC   Final    Culture  Setup Time 09/13/2012 20:03   Final    Culture     Final    Value:        BLOOD CULTURE RECEIVED NO GROWTH TO DATE CULTURE WILL BE HELD FOR 5 DAYS BEFORE ISSUING A FINAL NEGATIVE REPORT   Report Status PENDING   Incomplete   CULTURE, BLOOD (ROUTINE X 2)     Status: Normal (Preliminary result)   Collection Time   09/13/12 12:50 PM      Component Value Range Status Comment   Specimen Description BLOOD RIGHT HAND   Final    Special Requests BOTTLES DRAWN AEROBIC ONLY 10CC   Final    Culture  Setup Time 09/13/2012 20:03   Final    Culture     Final    Value:        BLOOD CULTURE RECEIVED NO GROWTH TO DATE CULTURE WILL BE HELD FOR 5 DAYS BEFORE ISSUING A FINAL NEGATIVE REPORT   Report Status PENDING   Incomplete    Medications: I have reviewed the patient's current medications. Scheduled Meds:   . clonazePAM  1 mg Oral BID  . gabapentin  300 mg Oral TID  . heparin  5,000 Units Subcutaneous Q8H  . ketorolac  30 mg Intravenous Q6H  . predniSONE  60 mg Oral Q breakfast  . risperiDONE  0.5 mg Oral QHS   Continuous Infusions:  PRN Meds:.alum & mag hydroxide-simeth, diphenhydrAMINE, morphine injection  Assessment/Plan: Ms. Selk is a 41 year old female with past history significant for untreated and incompletely differentiated autoimmune disease, MRSA,  depression, and polysubstance abuse admitted for acute rash eruption, extremity joint pain, and burning feet. She has had multiple similar hospital admissions in the past, including a inconclusive workups for Lupus and RA. She has failed to follow up with rheumatology and dermatology appointments in the past, but insists she will go to her upcoming consult at [facility] Rhe[facility].  Acute skin eruption of discoloration, elevations, blisters Unclear etiology  but has hx of vasculitis as seen on prior biopsy. Positive myeloperoxidase and Proteinase ANCAs. On prednisone and gabapentin at home. Gonorrhea pcr in the past negative. HIV, HepC, Ace tests all negative on this admission -WBC this yesterday am was 9.7  -Rheumatology consult at [facility] rheumatology has been arranged, clinic will contact patient. -DC ketorolac for home use because of cost -Naproxen Sodium PO,  here and suggest for home use -will continue diphenhydramine (Benadryl) treatment at home  Depression Pt has a hx of depression treated with Clonazepam and Risperadone at home. She is very upset and regarding her condition and has feelings of guilt for her family who take care of her. Has been evaluated by psychiatry on previous admission, and denies suicidal ideation at this time. -continue home regimen Clonezepam -consider DC risperadone and switching to, Fluoxetine PO (30 day) with suggestion that depression sx should be managed by PCP -patient is getting cell phone to help manage care to relieve burden of followup conversations and scheduling from family members;pt agrees this is important  Polysubstance abuse Hx of cocaine and THC use, denies all other illicit drug use. Denies recent drug use for ~the past month. Urine tox on admission was positive for amphetamines and benzodiazepines. 08/25/12 urine tox was positive for amphetamines, benzodiazepines, and THC.  -counseling given cessation strongly advised   DVT Px: Heparin  Dispo: discharge  this afternoon following confirmation [facility] has received referral information   LOS: 2 days   This is a Psychologist, occupational Note.  The care of the patient was discussed with Dr. Josem Kaufmann and the assessment and plan formulated with their assistance.  Please see their attached note for official documentation of the daily encounter.  Ellin Mayhew 09/15/2012, 1:37 PM  PGY-1 Note Amendment :  I have reviewed and agree with Medical student, Jacqlyn Krauss note. Please see my separate note for corrections and changes.   Darden Palmer  09/15/12 5:17 PM

## 2012-09-15 NOTE — Progress Notes (Signed)
UR COMPLETED  

## 2012-09-15 NOTE — Progress Notes (Signed)
Subjective: [name] was seen and examined at bedside this morning.  She is feeling better since admission and has no major complaints today and would like to go home.  Her rash is still present on her buttocks but improving along with improvement noted on her extremities.  She denies any burning sensation in her feet which was present on admission and claims she is now able to walk with ease.  She has no other complaints at this time, tolerating diet well, and slept well throughout the night.  She denies any headaches, N/V/D, fever, chills, abdominal pain, chest pain, or shortness of breath at this time.    Objective: Vital signs in last 24 hours: Filed Vitals:   09/14/12 0504 09/14/12 1557 09/14/12 2211 09/15/12 0444  BP: 101/58 106/57 141/75 136/68  Pulse: 75 79 89 90  Temp: 98 F (36.7 C) 98.1 F (36.7 C) 98.9 F (37.2 C) 98.2 F (36.8 C)  TempSrc:  Tympanic Oral Oral  Resp: 19 18 16 16   Height:      Weight:      SpO2: 97% 97% 99% 98%   Weight change:   Intake/Output Summary (Last 24 hours) at 09/15/12 1618 Last data filed at 09/14/12 2100  Gross per 24 hour  Intake    240 ml  Output      0 ml  Net    240 ml   Vitals reviewed. General: resting in bed, NAD  HEENT: PERRLA, EOMI, no scleral icterus, + ulcer like lesion on inner surface of bottom lip, vesicular lesion on right side of tongue  Cardiac: RRR, no rubs, murmurs or gallops  Pulm: clear to auscultation bilaterally, no wheezes, rales, or rhonchi  Abd: soft, nontender, nondistended, BS present  Ext: warm and well perfused, no pedal edema, improved + nodular like lesions on lateral surface of b/l feet and left heel, + diffuse vesicular lesions on different stages of healing on b/l lower and upper extremities and buttocks. Improved non-fluctuant swelling on b/l hands--MCPs, b/l knees, and ankles. +2DP b/l. + surgical scars b/l lower extremities near feet  Neuro: alert and oriented X3, cranial nerves II-XII grossly intact,  strength and sensation to light touch equal in bilateral upper and lower extremities. Skin: multiple scattered erythematous small excoriated papules in various stages of healing in buttocks region, b/l upper and lower extremities.    Lab Results: Basic Metabolic Panel:  Lab 09/13/12 1610  NA 136  K 4.1  CL 102  CO2 20  GLUCOSE 126*  BUN 18  CREATININE 0.68  CALCIUM 9.9  MG --  PHOS --   CBC:  Lab 09/14/12 0525 09/13/12 0535  WBC 9.7 18.4*  NEUTROABS -- 15.8*  HGB 12.2 15.1*  HCT 37.0 42.9  MCV 86.2 84.0  PLT 252 270   Hemoglobin A1C:  Lab 09/13/12 1240  HGBA1C 5.5   Urine Drug Screen: Drugs of Abuse     Component Value Date/Time   LABOPIA NONE DETECTED 09/13/2012 0548   COCAINSCRNUR NONE DETECTED 09/13/2012 0548   LABBENZ POSITIVE* 09/13/2012 0548   AMPHETMU POSITIVE* 09/13/2012 0548   THCU NONE DETECTED 09/13/2012 0548   LABBARB NONE DETECTED 09/13/2012 0548    Urinalysis:  Lab 09/13/12 0548  COLORURINE AMBER*  LABSPEC >1.046*  PHURINE 5.5  GLUCOSEU NEGATIVE  HGBUR NEGATIVE  BILIRUBINUR SMALL*  KETONESUR NEGATIVE  PROTEINUR 30*  UROBILINOGEN 0.2  NITRITE NEGATIVE  LEUKOCYTESUR NEGATIVE   Studies/Results: No results found. Medications: I have reviewed the patient's current medications. Scheduled  Meds:    . clonazePAM  1 mg Oral BID  . gabapentin  300 mg Oral TID  . heparin  5,000 Units Subcutaneous Q8H  . ketorolac  30 mg Intravenous Q6H  . predniSONE  60 mg Oral Q breakfast  . risperiDONE  0.5 mg Oral QHS   Continuous Infusions:  PRN Meds:.alum & mag hydroxide-simeth, diphenhydrAMINE, morphine injection  Assessment/Plan: Ms. Jagger is a 41 year old female with PMH significant for prior biopsy of Leukocytoclastic vasculitis, MSSA, MRSA, depression, and polysubstance abuse admitted for acute rash eruption with red discoloration and blistering along with joint pain.  She has had multiple similar hospital admissions in the past, negative work  up for Lupus.  Failure to follow up with rheumatology and dermatology appointments.   Acute skin eruption of discoloration, elevations, blisters--unclear etiology but has hx of Leukocytoclastic vasculitis as seen on prior biopsy.  Positive myeloperoxidase and Proteinase ANCAs.  On prednisone and neurontin at home. Hx of polysubstance abuse--cocaine and THC. Gonorrhea pcr in the past negative.   -f/u blood cx--NGTD -wbc on admission: 18.4-->9.7  -morphine 1mg  IV Q2H PRN  -Toradol 30mg  IV Q6H  -outpatient follow up [facility] rheumatology arranged--records faxed and she will be called for appointment or will need to schedule appointment if she does not hear back.  Social work and case management on board in regards to transportation assistance and coordinating with [facility] in regards to charity clinic.   -hospital follow up visit arranged for opc 09/22/12 and also given list of affordable options for primary care in the local area.  Depression--noted to have hx of depression. On Klonopin and Risperdal at home. Denies suicidal or homicidal ideation. Frustrated with vasculitis and pain. Has been evaluated by psychiatry on previous admission. Multiple social factors including limited financial resources, lives at home with sister, has been denied disability twice.  Gets very anxious at circumstances. -continue home regimen klonopin and risperdal--follow up with pcp advised as outpatient -pain control -consider follow up at [facility] as needed   Polysubstance abuse: hx of cocaine and THC use, denies all other illicit drug use. Denies recent drug use x1 month. Urine tox on admission: positive for amphetamines and benzodiazepines. 08/25/12 urine tox: positive for amphetamines, benzodiazepines, and THC.  -counseling given cessation strongly advised and encouraged to continue cessation.   Diet: Regular  DVT Px: Heparin  Dispo: d/c to home today with follow up at [facility] clinic and in [facility] clinic, and need to  establish primary care for long term follow up.     LOS: 2 days   Darden Palmer 09/15/2012, 4:18 PM

## 2012-09-17 ENCOUNTER — Emergency Department (HOSPITAL_COMMUNITY): Payer: Self-pay

## 2012-09-17 ENCOUNTER — Encounter (HOSPITAL_COMMUNITY): Payer: Self-pay | Admitting: Emergency Medicine

## 2012-09-17 ENCOUNTER — Observation Stay (HOSPITAL_COMMUNITY)
Admission: EM | Admit: 2012-09-17 | Discharge: 2012-09-18 | Disposition: A | Discharge status: Home / Self Care | Payer: Self-pay | Attending: Emergency Medicine | Admitting: Emergency Medicine

## 2012-09-17 DIAGNOSIS — L03116 Cellulitis of left lower limb: Secondary | ICD-10-CM

## 2012-09-17 DIAGNOSIS — Z9889 Other specified postprocedural states: Secondary | ICD-10-CM | POA: Insufficient documentation

## 2012-09-17 DIAGNOSIS — G8929 Other chronic pain: Secondary | ICD-10-CM | POA: Insufficient documentation

## 2012-09-17 DIAGNOSIS — Z79899 Other long term (current) drug therapy: Secondary | ICD-10-CM | POA: Insufficient documentation

## 2012-09-17 DIAGNOSIS — F3289 Other specified depressive episodes: Secondary | ICD-10-CM | POA: Insufficient documentation

## 2012-09-17 DIAGNOSIS — L02419 Cutaneous abscess of limb, unspecified: Principal | ICD-10-CM | POA: Insufficient documentation

## 2012-09-17 DIAGNOSIS — F17200 Nicotine dependence, unspecified, uncomplicated: Secondary | ICD-10-CM | POA: Insufficient documentation

## 2012-09-17 DIAGNOSIS — F329 Major depressive disorder, single episode, unspecified: Secondary | ICD-10-CM | POA: Insufficient documentation

## 2012-09-17 DIAGNOSIS — Z8679 Personal history of other diseases of the circulatory system: Secondary | ICD-10-CM | POA: Insufficient documentation

## 2012-09-17 DIAGNOSIS — IMO0002 Reserved for concepts with insufficient information to code with codable children: Secondary | ICD-10-CM | POA: Insufficient documentation

## 2012-09-17 LAB — CBC WITH DIFFERENTIAL/PLATELET
Basophils Absolute: 0 10*3/uL (ref 0.0–0.1)
Eosinophils Absolute: 0 10*3/uL (ref 0.0–0.7)
Eosinophils Relative: 0 % (ref 0–5)
Lymphocytes Relative: 16 % (ref 12–46)
Lymphs Abs: 2 10*3/uL (ref 0.7–4.0)
MCH: 28.7 pg (ref 26.0–34.0)
MCV: 86.4 fL (ref 78.0–100.0)
Neutrophils Relative %: 78 % — ABNORMAL HIGH (ref 43–77)
Platelets: 233 10*3/uL (ref 150–400)
RBC: 4.77 MIL/uL (ref 3.87–5.11)
RDW: 14 % (ref 11.5–15.5)
WBC: 12.7 10*3/uL — ABNORMAL HIGH (ref 4.0–10.5)

## 2012-09-17 LAB — BASIC METABOLIC PANEL
Calcium: 9.5 mg/dL (ref 8.4–10.5)
GFR calc non Af Amer: >90 mL/min (ref >90)
Glucose, Bld: 104 mg/dL — ABNORMAL HIGH (ref 70–99)
Potassium: 4.3 mEq/L (ref 3.5–5.1)
Sodium: 139 mEq/L (ref 135–145)

## 2012-09-17 MED ORDER — MORPHINE SULFATE 4 MG/ML IJ SOLN
4.0000 mg | Freq: Once | INTRAMUSCULAR | Status: AC
  Administered 2012-09-17: 4 mg via INTRAVENOUS
  Filled 2012-09-17: qty 1

## 2012-09-17 MED ORDER — MORPHINE SULFATE 4 MG/ML IJ SOLN: 4.0000 mg | Freq: Once | INTRAMUSCULAR | Status: DC

## 2012-09-17 MED ORDER — DIPHENHYDRAMINE HCL 50 MG/ML IJ SOLN
25.0000 mg | Freq: Once | INTRAMUSCULAR | Status: AC
  Administered 2012-09-17: 25 mg via INTRAVENOUS
  Filled 2012-09-17: qty 1

## 2012-09-17 MED ORDER — VANCOMYCIN HCL IN DEXTROSE 1-5 GM/200ML-% IV SOLN
1000.0000 mg | Freq: Two times a day (BID) | INTRAVENOUS | Status: AC
  Administered 2012-09-17 – 2012-09-18 (×2): 1000 mg via INTRAVENOUS
  Filled 2012-09-17 (×2): qty 200

## 2012-09-17 MED ORDER — ONDANSETRON HCL 4 MG/2ML IJ SOLN
4.0000 mg | Freq: Four times a day (QID) | INTRAMUSCULAR | Status: DC | PRN
  Administered 2012-09-17: 4 mg via INTRAVENOUS
  Filled 2012-09-17 (×2): qty 2

## 2012-09-17 MED ORDER — SODIUM CHLORIDE 0.9 % IV SOLN
1000.0000 mL | INTRAVENOUS | Status: DC
  Administered 2012-09-17: 1000 mL via INTRAVENOUS

## 2012-09-17 MED ORDER — OXYCODONE-ACETAMINOPHEN 5-325 MG PO TABS
2.0000 | ORAL_TABLET | Freq: Once | ORAL | Status: AC
  Administered 2012-09-17: 2 via ORAL
  Filled 2012-09-17: qty 2

## 2012-09-17 MED ORDER — MORPHINE SULFATE 4 MG/ML IJ SOLN
4.0000 mg | INTRAMUSCULAR | Status: DC | PRN
  Administered 2012-09-17 – 2012-09-18 (×3): 4 mg via INTRAVENOUS
  Filled 2012-09-17 (×4): qty 1

## 2012-09-17 MED ORDER — CLONAZEPAM 0.5 MG PO TABS
1.0000 mg | ORAL_TABLET | Freq: Once | ORAL | Status: AC
  Administered 2012-09-17: 1 mg via ORAL
  Filled 2012-09-17: qty 2

## 2012-09-17 NOTE — ED Notes (Signed)
Pt c/o pain and redness with swelling to left lower leg where had abscess lanced in August; pt sts recently admitted; pt sts increased pain today but denies new injury

## 2012-09-17 NOTE — ED Provider Notes (Signed)
41 year old female moved to CDU for cellulitis protocol. Markings drawn a round area of erythema prior to moving to the CDU. First dose of vancomycin started. Currently she is complaining of 9/10 pain in left lower leg were cellulitis is present. Denies nausea, vomiting, fever or chills. On exam patient is tearful but in no apparent distress. Heart tachycardic with regular rhythm. Lungs CTA. Skin on lateral aspect of left lower extremity was too small less than 1 cm open wounds with clear drainage and surrounding erythema. Very tender to palpation and warm to touch. Mild edema. Distal pulses intact. She is AAOx3. Psych-patient is very anxious and dramatic with rapid speech. 11:51 PM Case discussed with Dr. Norlene Campbell who will take over care of patient at this time.  Trevor Mace, PA-C 09/17/12 2351

## 2012-09-17 NOTE — ED Provider Notes (Signed)
History     CSN: [id number]  Arrival date & time 09/17/12  1254   First MD Initiated Contact with Patient 09/17/12 1606      Chief Complaint  Patient presents with  . Leg Pain    (Consider location/radiation/quality/duration/timing/severity/associated sxs/prior treatment) HPI Comments: This is a 41 year old female, who presents to the ED with a chief complaint of left leg pain and infection.  She states that she had an abscess drained on the lateral side of her left ankle in August, but that the wound has not healed since. She states that she has new redness surrounding the site since this morning. She states that she is in severe discomfort.  She states the the leg pain began early this morning, and that it has been getting worse.  She has not tried taking anything for the pain.  The history is provided by the patient. No language interpreter was used.    Past Medical History  Diagnosis Date  . Depression   . Chronic pain   . Leukocytoclastic vasculitis   . Tobacco abuse   . Lupus   . Lupus     Past Surgical History  Procedure Date  . Cholecystectomy   . Tubal ligation   . Ankle surgery     History reviewed. No pertinent family history.  History  Substance Use Topics  . Smoking status: Current Every Day Smoker -- 1.0 packs/day    Types: Cigarettes  . Smokeless tobacco: Never Used  . Alcohol Use: No    OB History    Grav Para Term Preterm Abortions TAB SAB Ect Mult Living                  Review of Systems  Constitutional: Negative for fever.  Musculoskeletal:       Left ankle pain  Skin:       Small erythematous lesions covering majority of body, unknown origin,  All other systems reviewed and are negative.    Allergies  Review of patient's allergies indicates no known allergies.  Home Medications   Current Outpatient Rx  Name Route Sig Dispense Refill  . CLONAZEPAM 1 MG PO TABS Oral Take 1 tablet (1 mg total) by mouth 2 (two) times daily. 30  tablet 0  . DIPHENHYDRAMINE HCL 25 MG PO CAPS Oral Take 1 capsule (25 mg total) by mouth every 6 (six) hours as needed for itching. 30 capsule   . DIPHENHYDRAMINE-APAP (SLEEP) 25-500 MG PO TABS Oral Take 1 tablet by mouth at bedtime as needed. For pain    . GABAPENTIN 300 MG PO CAPS Oral Take 1 capsule (300 mg total) by mouth 3 (three) times daily. 90 capsule 2  . IBUPROFEN 200 MG PO TABS Oral Take 400 mg by mouth every 6 (six) hours as needed. For pain    . NAPROXEN 500 MG PO TABS Oral Take 1 tablet (500 mg total) by mouth 2 (two) times daily with a meal. 60 tablet 1  . PREDNISONE 10 MG PO TABS Oral Take 1 tablet (10 mg total) by mouth daily. 30 tablet 2  . TRAMADOL-ACETAMINOPHEN 37.5-325 MG PO TABS Oral Take 2 tablets by mouth every 6 (six) hours as needed. For pain      BP 132/93  Pulse 96  Temp 97.9 F (36.6 C) (Oral)  Resp 20  SpO2 99%  LMP 08/23/2012  Physical Exam  Nursing note and vitals reviewed. Constitutional: She is oriented to person, place, and time. She appears  well-developed and well-nourished.  HENT:  Head: Normocephalic and atraumatic.  Eyes: Conjunctivae normal and EOM are normal. Pupils are equal, round, and reactive to light.  Neck: Normal range of motion. Neck supple.  Cardiovascular: Normal rate, regular rhythm and normal heart sounds.   Pulmonary/Chest: Effort normal and breath sounds normal.  Abdominal: Soft. Bowel sounds are normal.  Musculoskeletal: Normal range of motion.  Neurological: She is alert and oriented to person, place, and time.  Skin:       Left ankle open wound, two 1 cm wounds, with surrounding erythema extending out ~4-5 cm, without streaking or swelling,   Psychiatric:       Very dramatic, and tearful at bedside    ED Course  Procedures (including critical care time)   Labs Reviewed  CBC WITH DIFFERENTIAL  BASIC METABOLIC PANEL   Results for orders placed during the hospital encounter of 09/17/12  CBC WITH DIFFERENTIAL       Component Value Range   WBC 12.7 (*) 4.0 - 10.5 K/uL   RBC 4.77  3.87 - 5.11 MIL/uL   Hemoglobin 13.7  12.0 - 15.0 g/dL   HCT 95.2  84.1 - 32.4 %   MCV 86.4  78.0 - 100.0 fL   MCH 28.7  26.0 - 34.0 pg   MCHC 33.3  30.0 - 36.0 g/dL   RDW 40.1  02.7 - 25.3 %   Platelets 233  150 - 400 K/uL   Neutrophils Relative 78 (*) 43 - 77 %   Neutro Abs 9.9 (*) 1.7 - 7.7 K/uL   Lymphocytes Relative 16  12 - 46 %   Lymphs Abs 2.0  0.7 - 4.0 K/uL   Monocytes Relative 6  3 - 12 %   Monocytes Absolute 0.7  0.1 - 1.0 K/uL   Eosinophils Relative 0  0 - 5 %   Eosinophils Absolute 0.0  0.0 - 0.7 K/uL   Basophils Relative 0  0 - 1 %   Basophils Absolute 0.0  0.0 - 0.1 K/uL  BASIC METABOLIC PANEL      Component Value Range   Sodium 139  135 - 145 mEq/L   Potassium 4.3  3.5 - 5.1 mEq/L   Chloride 105  96 - 112 mEq/L   CO2 21  19 - 32 mEq/L   Glucose, Bld 104 (*) 70 - 99 mg/dL   BUN 14  6 - 23 mg/dL   Creatinine, Ser 6.64  0.50 - 1.10 mg/dL   Calcium 9.5  8.4 - 40.3 mg/dL   GFR calc non Af Amer >90  >90 mL/min   GFR calc Af Amer >90  >90 mL/min   Dg Ankle Complete Left  09/17/2012  *RADIOLOGY REPORT*  Clinical Data: Left lateral ankle wound  LEFT ANKLE COMPLETE - 3+ VIEW  Comparison: 09/13/2012  Findings: Soft tissue defect along the lateral malleolar region compatible with ankle wound.  No underlying osseous abnormality or hardware abnormality.  The patient has had a prior bimalleolar fixation.  Stable chronic degenerative changes likely post- traumatic.  No acute fracture evident.  IMPRESSION: Lateral malleolar soft tissue wound.  No underlying acute osseous finding or hardware abnormality  Post-traumatic degenerative changes   Original Report Authenticated By: Judie Petit. Ruel Favors, M.D.         1. Cellulitis    MDM  This is a 41 year old female, with new cellulitis, surrounding an older abscess.  Plain films show no signs of bony involvement.  The patient is  afebrile.      5:41 PM  Patient's  pain controlled with morphine.  I am going to transfer the patient to CDU, and have the patient placed on cellulitis protocol.  The plan will be to discharge the patient to home after 2 rounds of vanc.  The patient has a PCP appointment scheduled for Nov. 4th.  Consider asking wound care to evaluate the patient's wound in the morning.    Roxy Horseman, PA-C 09/17/12 [place] Fruit

## 2012-09-18 MED ORDER — OXYCODONE-ACETAMINOPHEN 5-325 MG PO TABS
1.0000 | ORAL_TABLET | Freq: Once | ORAL | Status: AC
  Administered 2012-09-18: 1 via ORAL
  Filled 2012-09-18: qty 1

## 2012-09-18 MED ORDER — OXYCODONE HCL 5 MG PO TABS
10.0000 mg | ORAL_TABLET | Freq: Once | ORAL | Status: AC
  Administered 2012-09-18: 10 mg via ORAL
  Filled 2012-09-18: qty 2

## 2012-09-18 MED ORDER — SULFAMETHOXAZOLE-TRIMETHOPRIM 800-160 MG PO TABS: 1.0000 | ORAL_TABLET | Freq: Two times a day (BID) | ORAL | Status: DC

## 2012-09-18 MED ORDER — OXYCODONE-ACETAMINOPHEN 5-325 MG PO TABS
1.0000 | ORAL_TABLET | Freq: Once | ORAL | Status: AC
  Administered 2012-09-18: 1 via ORAL
  Filled 2012-09-18 (×2): qty 1

## 2012-09-18 MED ORDER — CLONAZEPAM 0.5 MG PO TABS
1.0000 mg | ORAL_TABLET | Freq: Once | ORAL | Status: AC
  Administered 2012-09-18: 1 mg via ORAL
  Filled 2012-09-18: qty 2

## 2012-09-18 MED ORDER — CEPHALEXIN 500 MG PO CAPS: 500.0000 mg | ORAL_CAPSULE | Freq: Four times a day (QID) | ORAL | Status: DC

## 2012-09-18 MED ORDER — OXYCODONE-ACETAMINOPHEN 5-325 MG PO TABS: 1.0000 | ORAL_TABLET | ORAL | Status: DC | PRN

## 2012-09-18 NOTE — ED Provider Notes (Signed)
7:30 - Patient is crying in pain despite regular dosing of Morphine. Cellulitic redness does not extend beyond borders marked earlier in ED admission. She has had 2 doses of vancomycin as planned. Will discuss with [facility] resident for follow up as she states she has been referred and has a pending appointment next week. Percocet ordered in attempt to transition to oral pain medications anticipating discharge home.  9:30 - She appears more comfortable. She was able to tolerate entire breakfast. Cellulitis unchanged. She requests additional Percocet, which has been ordered.  10:30 - up to bathroom. No change in lower extremity redness. Pain improved. Discussed appointment with [facility] resident to obtain time - she is scheduled for 3:00 on Monday, November 4th. Will discharge home.  Rodena Medin, PA-C 09/18/12 1045

## 2012-09-18 NOTE — ED Notes (Signed)
Pt. Medicated for lt. Ankle pain per orders.

## 2012-09-18 NOTE — ED Notes (Signed)
Assisted pt to bathroom, pt complaining of ankle pain. Notified nurse

## 2012-09-18 NOTE — Progress Notes (Signed)
Utilization review completed.  

## 2012-09-18 NOTE — ED Notes (Signed)
Pt. Reports having severe pain to her lt. Ankle.  Redness is noted to lt. Ankle.  The area has been marked but the redness has not increased out of the margins.

## 2012-09-19 LAB — CULTURE, BLOOD (ROUTINE X 2): Culture: NO GROWTH

## 2012-09-20 ENCOUNTER — Emergency Department (HOSPITAL_COMMUNITY)
Admission: EM | Admit: 2012-09-20 | Discharge: 2012-09-20 | Disposition: A | Discharge status: Home / Self Care | Payer: Self-pay | Attending: Emergency Medicine | Admitting: Emergency Medicine

## 2012-09-20 ENCOUNTER — Encounter (HOSPITAL_COMMUNITY): Payer: Self-pay | Admitting: *Deleted

## 2012-09-20 DIAGNOSIS — F329 Major depressive disorder, single episode, unspecified: Secondary | ICD-10-CM | POA: Insufficient documentation

## 2012-09-20 DIAGNOSIS — L0390 Cellulitis, unspecified: Secondary | ICD-10-CM

## 2012-09-20 DIAGNOSIS — F3289 Other specified depressive episodes: Secondary | ICD-10-CM | POA: Insufficient documentation

## 2012-09-20 DIAGNOSIS — M310 Hypersensitivity angiitis: Secondary | ICD-10-CM | POA: Insufficient documentation

## 2012-09-20 DIAGNOSIS — M329 Systemic lupus erythematosus, unspecified: Secondary | ICD-10-CM | POA: Insufficient documentation

## 2012-09-20 DIAGNOSIS — L03119 Cellulitis of unspecified part of limb: Secondary | ICD-10-CM | POA: Insufficient documentation

## 2012-09-20 DIAGNOSIS — F17200 Nicotine dependence, unspecified, uncomplicated: Secondary | ICD-10-CM | POA: Insufficient documentation

## 2012-09-20 DIAGNOSIS — Z79899 Other long term (current) drug therapy: Secondary | ICD-10-CM | POA: Insufficient documentation

## 2012-09-20 DIAGNOSIS — G8929 Other chronic pain: Secondary | ICD-10-CM | POA: Insufficient documentation

## 2012-09-20 DIAGNOSIS — L02419 Cutaneous abscess of limb, unspecified: Secondary | ICD-10-CM | POA: Insufficient documentation

## 2012-09-20 MED ORDER — OXYCODONE-ACETAMINOPHEN 5-325 MG PO TABS
2.0000 | ORAL_TABLET | Freq: Once | ORAL | Status: AC
  Administered 2012-09-20: 2 via ORAL
  Filled 2012-09-20: qty 2

## 2012-09-20 MED ORDER — SULFAMETHOXAZOLE-TRIMETHOPRIM 800-160 MG PO TABS: 1.0000 | ORAL_TABLET | Freq: Two times a day (BID) | ORAL | Status: DC

## 2012-09-20 MED ORDER — OXYCODONE HCL 5 MG PO TABS: 5.0000 mg | ORAL_TABLET | ORAL | Status: DC | PRN

## 2012-09-20 NOTE — ED Provider Notes (Signed)
History     CSN: [id number]  Arrival date & time 09/20/12  1045   First MD Initiated Contact with Patient 09/20/12 1157      Chief Complaint  Patient presents with  . ankle infection     (Consider location/radiation/quality/duration/timing/severity/associated sxs/prior treatment) HPI Comments: Patient comes in today with a chief complaint of cellulitis of her left ankle.  She was seen in the ED for the same three days ago.  At that time she was put on the Cellulitis Protocol and given two rounds of IV Vancomycin.  She was then discharged home two days ago.  She was given prescriptions for Keflex and Bactrim DS.  She states that she has been taking Keflex, but has not been taking the Bactrim DS.  She reports that the pharmacy would not fill the prescription for Bactrim DS because it was not signed.  She denies fever or chills.  Denies vomiting.  No numbness or tingling.  She has been able to ambulate, but states that her pain is increased with ambulation.  She has full ROM of her ankle.    The history is provided by the patient.    Past Medical History  Diagnosis Date  . Depression   . Chronic pain   . Leukocytoclastic vasculitis   . Tobacco abuse   . Lupus   . Lupus     Past Surgical History  Procedure Date  . Cholecystectomy   . Tubal ligation   . Ankle surgery     No family history on file.  History  Substance Use Topics  . Smoking status: Current Every Day Smoker -- 1.0 packs/day    Types: Cigarettes  . Smokeless tobacco: Never Used  . Alcohol Use: No    OB History    Grav Para Term Preterm Abortions TAB SAB Ect Mult Living                  Review of Systems  Constitutional: Negative for fever and chills.  Gastrointestinal: Negative for nausea and vomiting.  Musculoskeletal:       Pain of the distal left leg around the area of the left ankle  Skin: Positive for color change and wound.  Neurological: Negative for numbness.  All other systems reviewed and  are negative.    Allergies  Review of patient's allergies indicates no known allergies.  Home Medications   Current Outpatient Rx  Name Route Sig Dispense Refill  . CEPHALEXIN 500 MG PO CAPS Oral Take 1 capsule (500 mg total) by mouth 4 (four) times daily. 40 capsule 0  . CLONAZEPAM 1 MG PO TABS Oral Take 1 tablet (1 mg total) by mouth 2 (two) times daily. 30 tablet 0  . DIPHENHYDRAMINE HCL 25 MG PO CAPS Oral Take 1 capsule (25 mg total) by mouth every 6 (six) hours as needed for itching. 30 capsule   . DIPHENHYDRAMINE-APAP (SLEEP) 25-500 MG PO TABS Oral Take 1 tablet by mouth at bedtime as needed. For pain    . GABAPENTIN 300 MG PO CAPS Oral Take 1 capsule (300 mg total) by mouth 3 (three) times daily. 90 capsule 2  . IBUPROFEN 200 MG PO TABS Oral Take 400 mg by mouth every 6 (six) hours as needed. For pain    . NAPROXEN 500 MG PO TABS Oral Take 1 tablet (500 mg total) by mouth 2 (two) times daily with a meal. 60 tablet 1  . OXYCODONE HCL 5 MG PO TABS Oral Take 1 tablet (  5 mg total) by mouth every 4 (four) hours as needed for pain. 20 tablet 0  . OXYCODONE-ACETAMINOPHEN 5-325 MG PO TABS Oral Take 1 tablet by mouth every 4 (four) hours as needed for pain. 20 tablet 0  . PREDNISONE 10 MG PO TABS Oral Take 1 tablet (10 mg total) by mouth daily. 30 tablet 2  . SULFAMETHOXAZOLE-TRIMETHOPRIM 800-160 MG PO TABS Oral Take 1 tablet by mouth 2 (two) times daily. 20 tablet 0  . SULFAMETHOXAZOLE-TRIMETHOPRIM 800-160 MG PO TABS Oral Take 1 tablet by mouth every 12 (twelve) hours. 20 tablet 0  . TRAMADOL-ACETAMINOPHEN 37.5-325 MG PO TABS Oral Take 2 tablets by mouth every 6 (six) hours as needed. For pain      BP 116/78  Pulse 104  Temp 98.1 F (36.7 C) (Oral)  Resp 16  SpO2 97%  LMP 08/21/2012  Physical Exam  Nursing note and vitals reviewed. Constitutional: She appears well-developed and well-nourished. No distress.  HENT:  Head: Normocephalic and atraumatic.  Mouth/Throat: Oropharynx is  clear and moist.  Cardiovascular: Normal rate, regular rhythm and normal heart sounds.   Pulses:      Dorsalis pedis pulses are 2+ on the right side, and 2+ on the left side.  Pulmonary/Chest: Effort normal and breath sounds normal.  Musculoskeletal: Normal range of motion.       Left ankle: She exhibits normal range of motion and no swelling.       Full ROM of the left ankle  Neurological: She is alert. No sensory deficit. Gait normal.  Skin: She is not diaphoretic.       Area of cellulitis appears to be improving based on the area that was marked two days ago.  No area of fluctuance.  No streaking.    Small wounds around the left ankle.  No drainage from the wounds.  Psychiatric: She has a normal mood and affect.    ED Course  Procedures (including critical care time)  Labs Reviewed - No data to display No results found.   1. Cellulitis       MDM  Patient presents today with Cellulitis of the left ankle.  Patient is afebrile.  Full ROM of the left ankle.  She was seen in the ED 3 days ago for the same and was on Cellulitis Protocol where she received 2 rounds of IV Vancomycin.  She has been taking Keflex since she was discharged two days ago, but has not been taking her Bactrim DS.  Area of Cellulitis appears to be improving based on where the area was marked 3 days ago.  Therefore, feel that patient can be discharged home.  Patient given another prescription for Bactrim DS.  Return precautions discussed with the patient.        Pascal Lux [place], PA-C 09/21/12 [phone]

## 2012-09-20 NOTE — ED Provider Notes (Signed)
Medical screening examination/treatment/procedure(s) were conducted as a shared visit with non-physician practitioner(s) and myself.  I personally evaluated the patient during the encounter Please see my previous note.  Laray Anger, DO 09/20/12 [phone]

## 2012-09-20 NOTE — ED Notes (Signed)
Pt has appt with specialist on December 9th at [facility] but pt states pain unbearable and it is difficult to walk.

## 2012-09-20 NOTE — ED Notes (Signed)
PT had left lateral ankle abscess opened and drained one month ago and now with redness and infection and pain.  Pt has been on anbx for cellulitis.  Pt crying

## 2012-09-20 NOTE — ED Provider Notes (Signed)
Medical screening examination/treatment/procedure(s) were conducted as a shared visit with non-physician practitioner(s) and myself.  I personally evaluated the patient during the encounter   41yo F, c/o gradual onset and persistence of constant left leg "redness" for the past day.  States the site of the redness is at a previous I&D site 2 months ago. Afebrile, VSS, CTA, RRR, +erythema around left ankle small wounds. +pedal pulses. No streaking, no soft tissue crepitus.  WBC count mildly elevated. Start IV vancomycin. Place on CDU obs protocol.   Laray Anger, DO 09/20/12 [phone]

## 2012-09-21 LAB — CRYOGLOBULIN

## 2012-09-22 ENCOUNTER — Encounter: Payer: Self-pay | Admitting: Internal Medicine

## 2012-09-22 ENCOUNTER — Ambulatory Visit (INDEPENDENT_AMBULATORY_CARE_PROVIDER_SITE_OTHER): Payer: Self-pay | Admitting: Internal Medicine

## 2012-09-22 VITALS — BP 131/89 | HR 114 | Temp 96.0°F | Ht 63.0 in | Wt 205.6 lb

## 2012-09-22 DIAGNOSIS — M310 Hypersensitivity angiitis: Secondary | ICD-10-CM

## 2012-09-22 DIAGNOSIS — F329 Major depressive disorder, single episode, unspecified: Secondary | ICD-10-CM

## 2012-09-22 DIAGNOSIS — F341 Dysthymic disorder: Secondary | ICD-10-CM

## 2012-09-22 DIAGNOSIS — L0390 Cellulitis, unspecified: Secondary | ICD-10-CM

## 2012-09-22 DIAGNOSIS — L0291 Cutaneous abscess, unspecified: Secondary | ICD-10-CM | POA: Insufficient documentation

## 2012-09-22 MED ORDER — CLONAZEPAM 1 MG PO TABS: 1.0000 mg | ORAL_TABLET | Freq: Two times a day (BID) | ORAL | Status: DC | PRN

## 2012-09-22 MED ORDER — PREDNISONE 10 MG PO TABS: 10.0000 mg | ORAL_TABLET | Freq: Every day | ORAL | Status: DC

## 2012-09-22 MED ORDER — HYDROCODONE-ACETAMINOPHEN 5-325 MG PO TABS: 1.0000 | ORAL_TABLET | ORAL | Status: DC | PRN

## 2012-09-22 NOTE — Progress Notes (Signed)
  Subjective:    Patient ID: [name], female    DOB: 1971/02/15, 41 y.o.   MRN: [id number]  HPI patient is a pleasant 41 year woman who comes to the clinic for a hospital followup visit. [facility]. She was admitted to the hospital multiple times for skin eruptions in past 3-4 months -with recent admission from 09/13/2012 to 09/15/2012 .  after hospital discharge she came to ED on 09/17/2012 and was diagnosed with left above malleolar cellulitis and was kept in CDU for few hours when she got 2 doses of vancomycin and was discharged home on Keflex and Bactrim for 10 days total. She is taking her medications regularly as prescribed. Yesterday morning the above malleolar lesion popped open and started draining pus and blood. Pain got worse after that the patient says the pain feels like is into her bone. She had instrumentation of one other skin boil/abscess in August in ED- per ED physician and not a surgeon- per patient. She had ankle surgery about 16 years before and has plates and screws in place on left side.  Denies any fever, chills, nausea, vomiting or abdominal pain, chest pain, diarrhea, headache.  She has history of leukocytoclastic vasculitis with multiple skin eruptions. She is on chronic prednisone therapy- 10 mg daily. She has appointment with [facility] rheumatology in second week of December.   Review of Systems    As per history of present illness, all other systems reviewed and negative. Objective:   Physical Exam  General: Tearful due to pain HEENT: PERRL, EOMI, no scleral icterus Cardiac: S1, S2, RRR, no rubs, murmurs or gallops Pulm: clear to auscultation bilaterally, moving normal volumes of air Abd: soft, nontender, nondistended, BS present Skin: Left leg above malleolar wound- open, purulent drainage with blood. Extremely painful to touch. Multiple old healed lesions seen on bilateral upper, lower extremity, buttocks and abdomen. Extremities: No  cyanosis or edema. Neuro: alert and oriented X3, cranial nerves II-XII grossly intact       Assessment & Plan:

## 2012-09-22 NOTE — Patient Instructions (Signed)
Please followup with the surgeon tomorrow. Prescribe you Vicodin 20 tablets today. Call our clinic for appointment in 2-3 weeks or as needed.

## 2012-09-22 NOTE — Assessment & Plan Note (Addendum)
Patient has appointment with dermatology at [facility] on 10/27/2012. Followup with that. Refill prednisone 10 mg daily today.

## 2012-09-22 NOTE — Assessment & Plan Note (Signed)
Patient with recent hospital admission and then ED visit for vasculitis and cellulitis and was treated with vancomycin and discharged home with Keflex and Bactrim which he is taking daily. Despite this, patient has open wound above left lateral malleoli- draining purulent and bloody discharge and extremely painful and tender. I'm worried about possible hardware infection due to her possible blunt I&D in ED in August 2013 per patient. Considering the new lesion just a few centimeters above the old lesion- this old unhealed infection cannot be ruled out. - Discussed this with Dr. Midwife who saw the patient. - Patient has appointment tomorrow afternoon with Gen. Surgery- Dr. Derrell Lolling. - Meanwhile continue antibiotics.

## 2012-09-22 NOTE — ED Provider Notes (Signed)
Medical screening examination/treatment/procedure(s) were performed by non-physician practitioner and as supervising physician I was immediately available for consultation/collaboration.   Claritza July E Rukiya Hodgkins, MD 09/22/12 0816 

## 2012-09-22 NOTE — Assessment & Plan Note (Signed)
Will refill Klonopin 1 mg twice daily. We'll consider SSRI soon.

## 2012-09-23 ENCOUNTER — Encounter (INDEPENDENT_AMBULATORY_CARE_PROVIDER_SITE_OTHER): Payer: Self-pay | Admitting: General Surgery

## 2012-09-23 ENCOUNTER — Ambulatory Visit (INDEPENDENT_AMBULATORY_CARE_PROVIDER_SITE_OTHER): Payer: PRIVATE HEALTH INSURANCE | Admitting: General Surgery

## 2012-09-23 VITALS — BP 132/76 | HR 84 | Temp 97.0°F | Resp 16 | Ht 63.0 in | Wt 203.2 lb

## 2012-09-23 DIAGNOSIS — S91009A Unspecified open wound, unspecified ankle, initial encounter: Secondary | ICD-10-CM

## 2012-09-23 DIAGNOSIS — S81802A Unspecified open wound, left lower leg, initial encounter: Secondary | ICD-10-CM

## 2012-09-23 NOTE — Progress Notes (Signed)
Patient ID: [name], female   DOB: 11/08/1971, 41 y.o.   MRN: [id number] The patient is a 41 year old female who was referred for evaluation of left lower extremity wound. He states that 2 inferior wounds had popped up approximately 4 months ago. This 1 arose several weeks ago.  The patient states that the area again self draining without any purulence. Patient states that she is on amount of edema and pain with the left portion of the wound. Patient has multiple punctate-type wound to both arms and legs. She says she's been on numerous antibiotics but without relief.  On exam:  Left lower  Lateral malleolus wound approximately 1 cm in circumference without any drainage or purulence, minimal erythema.  Assessment and plan:  1.  For the patient to orthopedic physician for possible evaluation of her left from a wound secondary to previous plate and screws being placed after fracture.  2. Also the patient's infectious disease physician to evaluate for possible MRSA carrier.  3. Patient to return when necessary.

## 2012-09-23 NOTE — Progress Notes (Signed)
Thank you very much for seeing the patient and helping in care promptly.  Lyn Hollingshead.

## 2012-09-23 NOTE — Progress Notes (Signed)
Prednisone 10 mg Rx called to [facility] [address] [phone]) Dorie Rank, RN, 09/23/2012, 9:23A

## 2012-09-25 ENCOUNTER — Inpatient Hospital Stay (HOSPITAL_COMMUNITY): Payer: Self-pay | Admitting: Anesthesiology

## 2012-09-25 ENCOUNTER — Encounter (HOSPITAL_COMMUNITY): Payer: Self-pay | Admitting: Specialist

## 2012-09-25 ENCOUNTER — Encounter (HOSPITAL_COMMUNITY): Payer: Self-pay | Admitting: *Deleted

## 2012-09-25 ENCOUNTER — Inpatient Hospital Stay (HOSPITAL_COMMUNITY)
Admission: AD | Admit: 2012-09-25 | Discharge: 2012-09-29 | DRG: 493 | Disposition: A | Discharge status: Home Health | Payer: MEDICAID | Source: Ambulatory Visit | Attending: Specialist | Admitting: Specialist

## 2012-09-25 ENCOUNTER — Encounter (HOSPITAL_COMMUNITY): Payer: Self-pay | Admitting: Anesthesiology

## 2012-09-25 ENCOUNTER — Encounter (HOSPITAL_COMMUNITY)
Admission: AD | Disposition: A | Discharge status: Home Health | Payer: Self-pay | Source: Ambulatory Visit | Attending: Specialist

## 2012-09-25 DIAGNOSIS — F1211 Cannabis abuse, in remission: Secondary | ICD-10-CM | POA: Diagnosis present

## 2012-09-25 DIAGNOSIS — L03119 Cellulitis of unspecified part of limb: Secondary | ICD-10-CM

## 2012-09-25 DIAGNOSIS — Y831 Surgical operation with implant of artificial internal device as the cause of abnormal reaction of the patient, or of later complication, without mention of misadventure at the time of the procedure: Secondary | ICD-10-CM | POA: Diagnosis present

## 2012-09-25 DIAGNOSIS — L02419 Cutaneous abscess of limb, unspecified: Secondary | ICD-10-CM

## 2012-09-25 DIAGNOSIS — K219 Gastro-esophageal reflux disease without esophagitis: Secondary | ICD-10-CM | POA: Diagnosis present

## 2012-09-25 DIAGNOSIS — F3289 Other specified depressive episodes: Secondary | ICD-10-CM | POA: Diagnosis present

## 2012-09-25 DIAGNOSIS — M329 Systemic lupus erythematosus, unspecified: Secondary | ICD-10-CM | POA: Diagnosis present

## 2012-09-25 DIAGNOSIS — Z79899 Other long term (current) drug therapy: Secondary | ICD-10-CM

## 2012-09-25 DIAGNOSIS — M869 Osteomyelitis, unspecified: Secondary | ICD-10-CM | POA: Diagnosis present

## 2012-09-25 DIAGNOSIS — F411 Generalized anxiety disorder: Secondary | ICD-10-CM | POA: Diagnosis present

## 2012-09-25 DIAGNOSIS — Z9889 Other specified postprocedural states: Secondary | ICD-10-CM

## 2012-09-25 DIAGNOSIS — T847XXA Infection and inflammatory reaction due to other internal orthopedic prosthetic devices, implants and grafts, initial encounter: Principal | ICD-10-CM | POA: Diagnosis present

## 2012-09-25 DIAGNOSIS — M310 Hypersensitivity angiitis: Secondary | ICD-10-CM | POA: Diagnosis present

## 2012-09-25 DIAGNOSIS — G8929 Other chronic pain: Secondary | ICD-10-CM | POA: Diagnosis present

## 2012-09-25 DIAGNOSIS — M129 Arthropathy, unspecified: Secondary | ICD-10-CM | POA: Diagnosis present

## 2012-09-25 DIAGNOSIS — F329 Major depressive disorder, single episode, unspecified: Secondary | ICD-10-CM | POA: Diagnosis present

## 2012-09-25 DIAGNOSIS — Z8614 Personal history of Methicillin resistant Staphylococcus aureus infection: Secondary | ICD-10-CM

## 2012-09-25 DIAGNOSIS — A4901 Methicillin susceptible Staphylococcus aureus infection, unspecified site: Secondary | ICD-10-CM | POA: Diagnosis present

## 2012-09-25 DIAGNOSIS — F17200 Nicotine dependence, unspecified, uncomplicated: Secondary | ICD-10-CM | POA: Diagnosis present

## 2012-09-25 DIAGNOSIS — IMO0002 Reserved for concepts with insufficient information to code with codable children: Secondary | ICD-10-CM

## 2012-09-25 DIAGNOSIS — F1411 Cocaine abuse, in remission: Secondary | ICD-10-CM | POA: Diagnosis present

## 2012-09-25 DIAGNOSIS — Z823 Family history of stroke: Secondary | ICD-10-CM

## 2012-09-25 DIAGNOSIS — Z9089 Acquired absence of other organs: Secondary | ICD-10-CM

## 2012-09-25 DIAGNOSIS — F1511 Other stimulant abuse, in remission: Secondary | ICD-10-CM | POA: Diagnosis present

## 2012-09-25 HISTORY — PX: HARDWARE REMOVAL: SHX979

## 2012-09-25 HISTORY — DX: Anxiety disorder, unspecified: F41.9

## 2012-09-25 HISTORY — DX: Gastro-esophageal reflux disease without esophagitis: K21.9

## 2012-09-25 LAB — CBC WITH DIFFERENTIAL/PLATELET
Basophils Absolute: 0 10*3/uL (ref 0.0–0.1)
Basophils Relative: 0 % (ref 0–1)
Eosinophils Absolute: 0.1 10*3/uL (ref 0.0–0.7)
Eosinophils Relative: 1 % (ref 0–5)
HCT: 40.8 % (ref 36.0–46.0)
MCH: 28.8 pg (ref 26.0–34.0)
MCHC: 33.6 g/dL (ref 30.0–36.0)
MCV: 85.7 fL (ref 78.0–100.0)
Monocytes Absolute: 0.5 10*3/uL (ref 0.1–1.0)
RDW: 14 % (ref 11.5–15.5)

## 2012-09-25 LAB — COMPREHENSIVE METABOLIC PANEL
ALT: 23 U/L (ref 0–35)
Albumin: 3.7 g/dL (ref 3.5–5.2)
Alkaline Phosphatase: 94 U/L (ref 39–117)
Chloride: 99 mEq/L (ref 96–112)
Potassium: 4.7 mEq/L (ref 3.5–5.1)
Sodium: 136 mEq/L (ref 135–145)
Total Bilirubin: 0.2 mg/dL — ABNORMAL LOW (ref 0.3–1.2)
Total Protein: 7.9 g/dL (ref 6.0–8.3)

## 2012-09-25 LAB — GRAM STAIN

## 2012-09-25 LAB — SEDIMENTATION RATE: Sed Rate: 19 mm/hr (ref 0–22)

## 2012-09-25 LAB — HEMOGLOBIN A1C: Hgb A1c MFr Bld: 5.5 % (ref <5.7)

## 2012-09-25 LAB — SURGICAL PCR SCREEN: MRSA, PCR: NEGATIVE

## 2012-09-25 SURGERY — REMOVAL, HARDWARE
Anesthesia: General | Site: Ankle | Laterality: Left | Wound class: Dirty or Infected

## 2012-09-25 MED ORDER — MORPHINE SULFATE 2 MG/ML IJ SOLN
1.0000 mg | INTRAMUSCULAR | Status: DC | PRN
Start: 2012-09-25 — End: 2012-09-25

## 2012-09-25 MED ORDER — PROMETHAZINE HCL 25 MG/ML IJ SOLN: 6.2500 mg | INTRAMUSCULAR | Status: DC | PRN

## 2012-09-25 MED ORDER — ROCURONIUM BROMIDE 100 MG/10ML IV SOLN
INTRAVENOUS | Status: DC | PRN
  Administered 2012-09-25: 20 mg via INTRAVENOUS

## 2012-09-25 MED ORDER — MIDAZOLAM HCL 2 MG/2ML IJ SOLN: 0.5000 mg | Freq: Once | INTRAMUSCULAR | Status: AC | PRN

## 2012-09-25 MED ORDER — SODIUM CHLORIDE 0.9 % IR SOLN
Status: DC | PRN
  Administered 2012-09-25: 22:00:00

## 2012-09-25 MED ORDER — FENTANYL CITRATE 0.05 MG/ML IJ SOLN
INTRAMUSCULAR | Status: DC | PRN
  Administered 2012-09-25: 150 ug via INTRAVENOUS
  Administered 2012-09-25: 100 ug via INTRAVENOUS

## 2012-09-25 MED ORDER — NEOSTIGMINE METHYLSULFATE 1 MG/ML IJ SOLN
INTRAMUSCULAR | Status: DC | PRN
  Administered 2012-09-25: 4 mg via INTRAVENOUS

## 2012-09-25 MED ORDER — 0.9 % SODIUM CHLORIDE (POUR BTL) OPTIME
TOPICAL | Status: DC | PRN
  Administered 2012-09-25: 1000 mL

## 2012-09-25 MED ORDER — OXYCODONE HCL 5 MG/5ML PO SOLN
5.0000 mg | Freq: Once | ORAL | Status: AC | PRN
  Filled 2012-09-25: qty 5

## 2012-09-25 MED ORDER — GLYCOPYRROLATE 0.2 MG/ML IJ SOLN
INTRAMUSCULAR | Status: DC | PRN
  Administered 2012-09-25: .6 mg via INTRAVENOUS

## 2012-09-25 MED ORDER — OXYCODONE HCL 5 MG PO TABS: 5.0000 mg | ORAL_TABLET | Freq: Once | ORAL | Status: AC | PRN

## 2012-09-25 MED ORDER — SUCCINYLCHOLINE CHLORIDE 20 MG/ML IJ SOLN
INTRAMUSCULAR | Status: DC | PRN
  Administered 2012-09-25: 120 mg via INTRAVENOUS

## 2012-09-25 MED ORDER — HYDROCORTISONE SOD SUCCINATE 100 MG IJ SOLR
100.0000 mg | Freq: Every day | INTRAMUSCULAR | Status: DC
  Filled 2012-09-25: qty 2

## 2012-09-25 MED ORDER — ONDANSETRON HCL 4 MG/2ML IJ SOLN: 4.0000 mg | Freq: Four times a day (QID) | INTRAMUSCULAR | Status: DC | PRN

## 2012-09-25 MED ORDER — LIDOCAINE HCL (CARDIAC) 20 MG/ML IV SOLN: INTRAVENOUS | Status: DC | PRN

## 2012-09-25 MED ORDER — MEPERIDINE HCL 25 MG/ML IJ SOLN: 6.2500 mg | INTRAMUSCULAR | Status: DC | PRN

## 2012-09-25 MED ORDER — LIDOCAINE HCL (CARDIAC) 20 MG/ML IV SOLN
INTRAVENOUS | Status: DC | PRN
  Administered 2012-09-25: 50 mg via INTRAVENOUS

## 2012-09-25 MED ORDER — HYDROMORPHONE HCL PF 1 MG/ML IJ SOLN
0.2500 mg | INTRAMUSCULAR | Status: DC | PRN
  Administered 2012-09-25 (×4): 0.5 mg via INTRAVENOUS

## 2012-09-25 MED ORDER — ONDANSETRON HCL 4 MG PO TABS: 4.0000 mg | ORAL_TABLET | Freq: Four times a day (QID) | ORAL | Status: DC | PRN

## 2012-09-25 MED ORDER — LACTATED RINGERS IV SOLN
INTRAVENOUS | Status: DC | PRN
  Administered 2012-09-25: 20:00:00 via INTRAVENOUS

## 2012-09-25 MED ORDER — MIDAZOLAM HCL 5 MG/5ML IJ SOLN
INTRAMUSCULAR | Status: DC | PRN
  Administered 2012-09-25: 2 mg via INTRAVENOUS

## 2012-09-25 MED ORDER — LACTATED RINGERS IV SOLN: INTRAVENOUS | Status: DC

## 2012-09-25 MED ORDER — PROPOFOL 10 MG/ML IV BOLUS
INTRAVENOUS | Status: DC | PRN
  Administered 2012-09-25: 200 mg via INTRAVENOUS

## 2012-09-25 MED ORDER — VANCOMYCIN HCL IN DEXTROSE 1-5 GM/200ML-% IV SOLN
1000.0000 mg | Freq: Three times a day (TID) | INTRAVENOUS | Status: DC
  Administered 2012-09-26 – 2012-09-28 (×8): 1000 mg via INTRAVENOUS
  Filled 2012-09-25 (×11): qty 200

## 2012-09-25 MED ORDER — MORPHINE SULFATE 2 MG/ML IJ SOLN
1.0000 mg | INTRAMUSCULAR | Status: DC | PRN
  Filled 2012-09-25: qty 1

## 2012-09-25 MED ORDER — ONDANSETRON HCL 4 MG/2ML IJ SOLN
INTRAMUSCULAR | Status: DC | PRN
  Administered 2012-09-25: 4 mg via INTRAVENOUS

## 2012-09-25 SURGICAL SUPPLY — 54 items
BANDAGE ELASTIC 4 VELCRO ST LF (GAUZE/BANDAGES/DRESSINGS) ×1 IMPLANT
BANDAGE ELASTIC 6 VELCRO ST LF (GAUZE/BANDAGES/DRESSINGS) IMPLANT
BANDAGE ESMARK 6X9 LF (GAUZE/BANDAGES/DRESSINGS) IMPLANT
BANDAGE GAUZE ELAST BULKY 4 IN (GAUZE/BANDAGES/DRESSINGS) ×2 IMPLANT
BNDG CMPR 9X6 STRL LF SNTH (GAUZE/BANDAGES/DRESSINGS)
BNDG COHESIVE 4X5 TAN STRL (GAUZE/BANDAGES/DRESSINGS) IMPLANT
BNDG ESMARK 6X9 LF (GAUZE/BANDAGES/DRESSINGS)
CLOTH BEACON ORANGE TIMEOUT ST (SAFETY) ×2 IMPLANT
COVER SURGICAL LIGHT HANDLE (MISCELLANEOUS) ×2 IMPLANT
CUFF TOURNIQUET SINGLE 18IN (TOURNIQUET CUFF) ×2 IMPLANT
CUFF TOURNIQUET SINGLE 24IN (TOURNIQUET CUFF) IMPLANT
CUFF TOURNIQUET SINGLE 34IN LL (TOURNIQUET CUFF) ×1 IMPLANT
CUFF TOURNIQUET SINGLE 44IN (TOURNIQUET CUFF) IMPLANT
DRAPE C-ARM 42X72 X-RAY (DRAPES) IMPLANT
DRAPE EXTREMITY T 121X128X90 (DRAPE) IMPLANT
DRAPE INCISE IOBAN 66X45 STRL (DRAPES) IMPLANT
DRAPE ORTHO SPLIT 77X108 STRL (DRAPES)
DRAPE PROXIMA HALF (DRAPES) ×1 IMPLANT
DRAPE SURG ORHT 6 SPLT 77X108 (DRAPES) IMPLANT
DRAPE U-SHAPE 47X51 STRL (DRAPES) ×2 IMPLANT
DRSG ADAPTIC 3X8 NADH LF (GAUZE/BANDAGES/DRESSINGS) ×1 IMPLANT
DRSG EMULSION OIL 3X3 NADH (GAUZE/BANDAGES/DRESSINGS) ×2 IMPLANT
DRSG PAD ABDOMINAL 8X10 ST (GAUZE/BANDAGES/DRESSINGS) ×2 IMPLANT
DURAPREP 26ML APPLICATOR (WOUND CARE) ×2 IMPLANT
ELECT REM PT RETURN 9FT ADLT (ELECTROSURGICAL) ×2
ELECTRODE REM PT RTRN 9FT ADLT (ELECTROSURGICAL) ×1 IMPLANT
GLOVE BIOGEL PI IND STRL 7.5 (GLOVE) ×1 IMPLANT
GLOVE BIOGEL PI INDICATOR 7.5 (GLOVE) ×1
GLOVE ECLIPSE 7.0 STRL STRAW (GLOVE) ×2 IMPLANT
GLOVE ECLIPSE 8.5 STRL (GLOVE) ×2 IMPLANT
GLOVE SURG 8.5 LATEX PF (GLOVE) ×2 IMPLANT
GOWN PREVENTION PLUS LG XLONG (DISPOSABLE) IMPLANT
GOWN PREVENTION PLUS XXLARGE (GOWN DISPOSABLE) ×2 IMPLANT
GOWN STRL NON-REIN LRG LVL3 (GOWN DISPOSABLE) ×6 IMPLANT
KIT BASIN OR (CUSTOM PROCEDURE TRAY) ×2 IMPLANT
KIT ROOM TURNOVER OR (KITS) ×2 IMPLANT
MANIFOLD NEPTUNE II (INSTRUMENTS) ×2 IMPLANT
NS IRRIG 1000ML POUR BTL (IV SOLUTION) ×2 IMPLANT
PACK GENERAL/GYN (CUSTOM PROCEDURE TRAY) ×2 IMPLANT
PAD ARMBOARD 7.5X6 YLW CONV (MISCELLANEOUS) ×4 IMPLANT
PAD CAST 4YDX4 CTTN HI CHSV (CAST SUPPLIES) ×1 IMPLANT
PADDING CAST COTTON 4X4 STRL (CAST SUPPLIES) ×2
SPONGE GAUZE 4X4 12PLY (GAUZE/BANDAGES/DRESSINGS) ×2 IMPLANT
STAPLER VISISTAT 35W (STAPLE) ×2 IMPLANT
STOCKINETTE IMPERVIOUS 9X36 MD (GAUZE/BANDAGES/DRESSINGS) IMPLANT
SUT ETHILON 4 0 FS 1 (SUTURE) IMPLANT
SUT PROLENE 2 0 SH 30 (SUTURE) ×1 IMPLANT
SUT VIC AB 0 CT1 27 (SUTURE)
SUT VIC AB 0 CT1 27XBRD ANBCTR (SUTURE) IMPLANT
SUT VIC AB 2-0 CT1 27 (SUTURE)
SUT VIC AB 2-0 CT1 TAPERPNT 27 (SUTURE) IMPLANT
TOWEL OR 17X24 6PK STRL BLUE (TOWEL DISPOSABLE) ×2 IMPLANT
TOWEL OR 17X26 10 PK STRL BLUE (TOWEL DISPOSABLE) ×2 IMPLANT
WATER STERILE IRR 1000ML POUR (IV SOLUTION) ×2 IMPLANT

## 2012-09-25 NOTE — Progress Notes (Signed)
Dr Nitka at bedside. 

## 2012-09-25 NOTE — Progress Notes (Signed)
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  Microbiology: Recent Results (from the past 720 hour(s))  CULTURE, BLOOD (ROUTINE X 2)     Status: Normal   Collection Time   09/13/12 12:40 PM      Component Value Range Status Comment   Specimen Description BLOOD RIGHT ARM   Final    Special Requests BOTTLES DRAWN AEROBIC ONLY 10CC   Final    Culture  Setup Time 09/13/2012 20:03   Final    Culture NO GROWTH 5 DAYS   Final    Report Status 09/19/2012 FINAL   Final   CULTURE, BLOOD (ROUTINE X 2)     Status: Normal   Collection Time   09/13/12 12:50 PM      Component Value Range Status Comment   Specimen Description BLOOD RIGHT HAND   Final    Special Requests BOTTLES DRAWN AEROBIC ONLY 10CC   Final    Culture  Setup Time 09/13/2012 20:03   Final    Culture NO GROWTH 5 DAYS   Final    Report Status 09/19/2012 FINAL   Final   SURGICAL PCR SCREEN     Status: Normal   Collection Time   09/25/12  2:34 PM      Component Value Range Status Comment   MRSA, PCR NEGATIVE  NEGATIVE Final    Staphylococcus aureus NEGATIVE  NEGATIVE Final   GRAM STAIN     Status: Normal   Collection Time   09/25/12  9:40 PM      Component Value Range Status Comment   Specimen Description ABSCESS LEFT FOOT   Final    Special Requests PATIENT ON FOLLOWING SULFA   Final    Gram Stain     Final    Value: MODERATE WBC PRESENT,BOTH PMN AND MONONUCLEAR     NO ORGANISMS  SEEN   Report Status 09/25/2012 FINAL   Final     Medical History: Past Medical History  Diagnosis Date  . Depression   . Chronic pain   . Leukocytoclastic vasculitis   . Tobacco abuse   . Lupus   . Lupus   . Arthritis   . Anxiety   . GERD (gastroesophageal reflux disease)     Medications:  Prescriptions prior to admission  Medication Sig Dispense Refill  . clonazePAM (KLONOPIN) 1 MG tablet Take 1 tablet (1 mg total) by mouth 2 (two) times daily as needed for anxiety.  60 tablet  0  . diphenhydrAMINE (BENADRYL) 25 mg capsule Take 1 capsule (25 mg total) by mouth every 6 (six) hours as needed for itching.  30 capsule    . diphenhydramine-acetaminophen (TYLENOL PM) 25-500 MG TABS Take 1 tablet by mouth at bedtime as needed. For pain      . HYDROcodone-acetaminophen (NORCO/VICODIN) 5-325 MG per tablet Take 1-2 tablets by mouth every 4 (four) hours as needed for pain.  20 tablet  0  .  ibuprofen (ADVIL,MOTRIN) 200 MG tablet Take 400 mg by mouth every 6 (six) hours as needed. For pain      . predniSONE (DELTASONE) 10 MG tablet Take 1 tablet (10 mg total) by mouth daily.  30 tablet  2   Scheduled:    . hydrocortisone sod succinate (SOLU-CORTEF) injection  100 mg Intravenous Daily  . vancomycin  1,000 mg Intravenous Q8H    Assessment: 41yo female has been treated multiple times within past year for recurrent/chronic cellulitis and abscesses, both MSSA and MRSA, multiple ABX have been Rx'd, most recently Bactrim in last few weeks; now c/o increasing purulent drainage from lateral malleolus, thought by ortho to possibly be infected hardware and now s/p I&D with hardware removal, to begin vancomycin; had been admitted several months ago and put on vanc, was subtherapeutic on vanc 1250mg  IV Q12H with trough 8 (at that time had goal of 10-15); ID recommends four weeks of ABX prior to considering suppressive therapy.  Goal of Therapy:  Vancomycin trough level 15-20 mcg/ml  Plan:  Will  begin vancomycin 1000mg  IV Q8H and monitor CBC, Cx, levels prn.  Colleen Can PharmD BCPS 09/25/2012,11:23 PM

## 2012-09-25 NOTE — Brief Op Note (Signed)
09/25/2012  9:49 PM  PATIENT:  [name]  41 y.o. female  PRE-OPERATIVE DIAGNOSIS:  Infected left ankle lateral malleolus plate and screws  POST-OPERATIVE DIAGNOSIS:Infected left ankle lateral malleolus plate and screws    PROCEDURE:  Procedure(s) (LRB) with comments: HARDWARE REMOVAL (Left) - Incision, drainage and debridement of left lateral ankle, removal of plate and screws  SURGEON:  Surgeon(s) and Role:    * Kerrin Champagne, MD - Primary   ANESTHESIA:   general, Dr. Jean Rosenthal.  EBL:  Total I/O In: 750 [I.V.:750] Out: - 50cc BLOOD ADMINISTERED:none  DRAINS: wound packed open with NS wet to dry.   LOCAL MEDICATIONS USED:  NONE  SPECIMEN:  Source of Specimen:  left lateral ankle wound abscess for anaerobic and aerobic C&S and fungal C&S and Stat gram stain. and Aspirate  DISPOSITION OF SPECIMEN:  Microbiology lab  COUNTS:  YES  TOURNIQUET:   Total Tourniquet Time Documented: Thigh (Left) - 18 minutes  DICTATION: .Reubin Milan Dictation  PLAN OF CARE: Admit to inpatient   PATIENT DISPOSITION:  PACU - hemodynamically stable.   Delay start of Pharmacological VTE agent (>24hrs) due to surgical blood loss or risk of bleeding: no

## 2012-09-25 NOTE — Anesthesia Procedure Notes (Signed)
Procedure Name: Intubation Date/Time: 09/25/2012 9:00 PM Performed by: Elray Dains S Pre-anesthesia Checklist: Patient identified, Suction available, Emergency Drugs available, Patient being monitored and Timeout performed Patient Re-evaluated:Patient Re-evaluated prior to inductionOxygen Delivery Method: Circle system utilized Preoxygenation: Pre-oxygenation with 100% oxygen Intubation Type: IV induction Ventilation: Mask ventilation without difficulty Laryngoscope Size: Mac and 3 Grade View: Grade I Tube type: Oral Tube size: 7.5 mm Number of attempts: 1 Airway Equipment and Method: Stylet Placement Confirmation: ETT inserted through vocal cords under direct vision,  positive ETCO2 and breath sounds checked- equal and bilateral Secured at: 22 cm Tube secured with: Tape Dental Injury: Teeth and Oropharynx as per pre-operative assessment
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
## 2012-09-25 NOTE — Transfer of Care (Signed)
Immediate Anesthesia Transfer of Care Note  Patient: [name]  Procedure(s) Performed: Procedure(s) (LRB) with comments: HARDWARE REMOVAL (Left) - Incision, drainage and debridement of left lateral ankle, removal of plate and screws  Patient Location: PACU  Anesthesia Type:General  Level of Consciousness: awake, alert  and oriented  Airway & Oxygen Therapy: Patient Spontanous Breathing and Patient connected to nasal cannula oxygen  Post-op Assessment: Report given to PACU RN and Post -op Vital signs reviewed and stable  Post vital signs: Reviewed and stable  Complications: No apparent anesthesia complications

## 2012-09-25 NOTE — Preoperative (Signed)
Beta Blockers   Reason not to administer Beta Blockers:Not Applicable 

## 2012-09-25 NOTE — Op Note (Addendum)
09/25/2012  8:28 PM  PATIENT:  [name]  41 y.o. female  MRN: [id number]  OPERATIVE REPORT  PRE-OPERATIVE DIAGNOSIS:  Infected left ankle lateral malleolus plate and screws  POST-OPERATIVE DIAGNOSIS: Infected left ankle lateral malleolus plate and screws  PROCEDURE:  Procedure(s): HARDWARE REMOVAL(Left) - Incision, drainage and excisional debridement of left lateral ankle, removal of plate and screws    SURGEON:  Kerrin Champagne, MD       ANESTHESIA:  General, Dr. Jean Rosenthal.    COMPLICATIONS:  None.     COMPONENTS: Removed 6 hole  3.5 mm 1/3 semitubular plate and 6 x 3.5 screws  EBL: 50cc  SPECIMENS: Swab of wound sent for anaerobic and aerobic C&S and fungal culture, and STAT gram stain.  PROCEDURE: The patient was met in the holding area, and the appropriate left leg identified and marked with an "X" and my initials.The patient was then transported to OR and was placed on the operative table in a supine position. The patient was then placed under  general anesthesia without difficulty. The patient received no preoperative antibiotic prophylaxis in order to perform intra op cultures.Tourniquet was applied to the operative  thigh.  Leg was then prepped using sterile conditions with duraprep and draped using sterile technique.  Time-out procedure was called and correct.The left leg  was elevated for 60 seconds and the tourniquet elevated ot 280 mmHg. incision was made in line with the old previous incision scar on the left lateral leg extending from approximately 3 cm proximal to the tip of the left fibula approximately over 12 cm. Incision with 10 blade scalpel through skin and subcutaneous layers directly down to the plate over the lateral aspect of the fibula. Purulent material noted about the upper one third of the plate and in the skin and subcutaneous layers. Swabs sent including tissue samples for anaerobic and aerobic and fungal cultures and sensitivities and stat Gram stain. Cobb  elevator used to expose the lateral aspect of the plate. Bone had covered a portion of the upper third of the plate superficially and this was debrided using bare rongeur. 2 screws distally and 3 screws proximally were then removed from the plate and the plate freed up off of the lateral fibula using Cobb elevator and delivered from the incision. Cobb elevator then used to expose over the anterior aspect of the third hole from distal anteriorly on the fibula exposing the 3.5 lag screw. Bone had overgrown the screw and this was removed using rongeur and curettes. The hex opening in the head of the screw was then carefully debrided the bone using small curette and the ends 7 Adson's forceps this was then able to be removed with a screwdriver. The wound was then debrided of skin that was thin and at risk of necrosis about the skin edges as well as the deeper fatty layers that were atrophic and appeared nonviable. Tourniquet was released irrigation was carried out using copious amounts of double antibiotic solution with polymycin and Neosporin. Bone edges were debrided of any sharp osteophytic material and the areas for the screw holes were debrided. There were no areas of bone that appeared to be involved with infection. The base of the plate was then curette curetted removing any fibrous and membranous material. Following this further irrigation was carried out using a double antibiotic solution. The distal 40% of the incision was closed and proximal 20% of the incision closed with interrupted vertical and simple sutures of 2-0 Prolene and the  open wound remaining was packed with 4 x 4 soaked in normal saline then Adaptic further 4 x 4's ABDs pad held in place with Kerlix and an Ace wrap applied. All instrument and sponge counts were correct. As the site represented to a site of infection the hardware removed and was not of given to the patient. Tourniquet was released prior to closure total tourniquet time at 280 mm  mercury was 17 minutes. Patient was then reactivated extubated and returned to the recovery room in satisfactory condition.       [name] E  09/25/2012, 8:28 PM

## 2012-09-25 NOTE — Consult Note (Signed)
[facility]  Total days of antibiotics 1        Day 1 vancomycin               Reason for Consult: hardware infection    Referring Physician: nitka  Principal Problem:  *Abscess of ankle    HPI: [name] is a 41 y.o. female with history of leukocytoclastic vasculitis (negative work up for lupus in the past, +ANA, -dsDNA, + myeloperoxidase and pANCA) on prednisone 10-20mg  daily and neurontin. She has had numerous, > 5 ED visits as well as hospital admissions in August and October for cellulitis where she has been treated with a few courses of IV antiobitcs for both MSSA as well as MRSA skin infection/abscesses. She did have lateral mallelus wound I x D back in august which has been slowly to heal. She has had ORIF of left trimalleolar ankle fracture and right ankle ORIF of a talus fracture/dislocation & fusion roughly 10-11 years ago. She has been on several courses of oral antibiotics, keflex and most recently bactrim in the last few weeks. In hte last week, she has noticed increasing purulent drainage from lateral malleolus. She was evaluated by Dr. Otelia Sergeant who felt that removal of hardware could lead to improvement of her chronic wound.   Past Medical History  Diagnosis Date  . Depression   . Chronic pain   . Leukocytoclastic vasculitis   . Tobacco abuse   . Lupus   . Lupus   . Arthritis   . Anxiety   . GERD (gastroesophageal reflux disease)     Allergies: No Known Allergies  MEDICATIONS:    . hydrocortisone sod succinate (SOLU-CORTEF) injection  100 mg Intravenous Daily    History  Substance Use Topics  . Smoking status: Current Every Day Smoker -- 1.0 packs/day    Types: Cigarettes  . Smokeless tobacco: Never Used  . Alcohol Use: No    Family History  Problem Relation Age of Onset  . Stroke Mother      Review of Systems  Constitutional: Negative for fever, chills, diaphoresis, activity change, appetite change, fatigue and  unexpected weight change.  HENT: Negative for congestion, sore throat, rhinorrhea, sneezing, trouble swallowing and sinus pressure.  Eyes: Negative for photophobia and visual disturbance.  Respiratory: Negative for cough, chest tightness, shortness of breath, wheezing and stridor.  Cardiovascular: Negative for chest pain, palpitations and leg swelling.  Gastrointestinal: Negative for nausea, vomiting, abdominal pain, diarrhea, constipation, blood in stool, abdominal distention and anal bleeding.  Genitourinary: Negative for dysuria, hematuria, flank pain and difficulty urinating.  Musculoskeletal: Negative for myalgias, back pain, joint swelling, arthralgias and gait problem.  Skin: Negative for color change, pallor, rash and wound.  Neurological: Negative for dizziness, tremors, weakness and light-headedness.  Hematological: Negative for adenopathy. Does not bruise/bleed easily.  Psychiatric/Behavioral: Negative for behavioral problems, confusion, sleep disturbance, dysphoric mood, decreased concentration and agitation.     OBJECTIVE: Temp:  [96.8 F (36 C)-97.6 F (36.4 C)] 96.8 F (36 C) (11/07 2200) Pulse Rate:  [81-109] 87  (11/07 2230) Resp:  [18-25] 25  (11/07 2230) BP: (121-130)/(78-99) 123/78 mmHg (11/07 2230) SpO2:  [95 %-100 %] 95 % (11/07 2230) Weight:  [205 lb (92.987 kg)] 205 lb (92.987 kg) (11/07 1433)  General appearance: alert, cooperative and mild distress  Eyes: negative findings: pupils equal, round, reactive to light and accomodation  Throat: normal findings: oropharynx pink & moist without lesions or evidence of thrush and  superficial lesoins on her lower lip.  Neck: no adenopathy and supple, symmetrical, trachea midline  Lungs: clear to auscultation bilaterally  Heart: regular rate and rhythm  Abdomen: normal findings: bowel sounds normal and soft, non-tender  Extremities: edema none  Skin: mulitple small (<1 cm) erythemtous areas, these are non-draining.  non-tender. she has 2 lesions on her L lateral ankle, no d/c is expressable from these lesions.     LABS: Results for orders placed during the hospital encounter of 09/25/12 (from the past 48 hour(s))  SURGICAL PCR SCREEN     Status: Normal   Collection Time   09/25/12  2:34 PM      Component Value Range Comment   MRSA, PCR NEGATIVE  NEGATIVE    Staphylococcus aureus NEGATIVE  NEGATIVE   COMPREHENSIVE METABOLIC PANEL     Status: Abnormal   Collection Time   09/25/12  3:36 PM      Component Value Range Comment   Sodium 136  135 - 145 mEq/L    Potassium 4.7  3.5 - 5.1 mEq/L    Chloride 99  96 - 112 mEq/L    CO2 26  19 - 32 mEq/L    Glucose, Bld 98  70 - 99 mg/dL    BUN 14  6 - 23 mg/dL    Creatinine, Ser 1.61  0.50 - 1.10 mg/dL    Calcium 9.3  8.4 - 09.6 mg/dL    Total Protein 7.9  6.0 - 8.3 g/dL    Albumin 3.7  3.5 - 5.2 g/dL    AST 19  0 - 37 U/L    ALT 23  0 - 35 U/L    Alkaline Phosphatase 94  39 - 117 U/L    Total Bilirubin 0.2 (*) 0.3 - 1.2 mg/dL    GFR calc non Af Amer >90  >90 mL/min    GFR calc Af Amer >90  >90 mL/min   CBC WITH DIFFERENTIAL     Status: Normal   Collection Time   09/25/12  3:36 PM      Component Value Range Comment   WBC 9.5  4.0 - 10.5 K/uL    RBC 4.76  3.87 - 5.11 MIL/uL    Hemoglobin 13.7  12.0 - 15.0 g/dL    HCT 04.5  40.9 - 81.1 %    MCV 85.7  78.0 - 100.0 fL    MCH 28.8  26.0 - 34.0 pg    MCHC 33.6  30.0 - 36.0 g/dL    RDW 91.4  78.2 - 95.6 %    Platelets 291  150 - 400 K/uL    Neutrophils Relative 63  43 - 77 %    Neutro Abs 6.0  1.7 - 7.7 K/uL    Lymphocytes Relative 30  12 - 46 %    Lymphs Abs 2.9  0.7 - 4.0 K/uL    Monocytes Relative 5  3 - 12 %    Monocytes Absolute 0.5  0.1 - 1.0 K/uL    Eosinophils Relative 1  0 - 5 %    Eosinophils Absolute 0.1  0.0 - 0.7 K/uL    Basophils Relative 0  0 - 1 %    Basophils Absolute 0.0  0.0 - 0.1 K/uL   SEDIMENTATION RATE     Status: Normal   Collection Time   09/25/12  3:36 PM      Component  Value Range Comment   Sed Rate 19  0 - 22 mm/hr  MICRO: 11/7 tissue cx PENDING 11/7 urine cx pending  Assessment/Plan:  41yo F with leukocystoclastic vasculitis on chronic prednisone has had recurrent cellulitis and concern for draining sinus of left lateral malleolus wound  Vs. Hardware infection.  - please check crp and sed rate - please keep patient on vancomycin, goal trough of 15-20. - would recommend to treat for 4 wks of IV antibiotics, and then can decide if need to have oral suppressive therapy.  Duke Salvia Drue Second MD MPH [facility] [phone]

## 2012-09-25 NOTE — Anesthesia Postprocedure Evaluation (Signed)
  Anesthesia Post-op Note  Patient: [name]  Procedure(s) Performed: Procedure(s) (LRB) with comments: HARDWARE REMOVAL (Left) - Incision, drainage and debridement of left lateral ankle, removal of plate and screws  Patient Location: PACU  Anesthesia Type:General  Level of Consciousness: sedated, patient cooperative and responds to stimulation  Airway and Oxygen Therapy: Patient Spontanous Breathing and Patient connected to nasal cannula oxygen  Post-op Pain: mild  Post-op Assessment: Post-op Vital signs reviewed, Patient's Cardiovascular Status Stable, Respiratory Function Stable, Patent Airway, No signs of Nausea or vomiting and Pain level controlled  Post-op Vital Signs: Reviewed and stable  Complications: No apparent anesthesia complications

## 2012-09-25 NOTE — Anesthesia Preprocedure Evaluation (Addendum)
Anesthesia Evaluation  Patient identified by MRN, date of birth, ID band Patient awake    Reviewed: Allergy & Precautions, H&P , NPO status , Patient's Chart, lab work & pertinent test results  History of Anesthesia Complications Negative for: history of anesthetic complications  Airway Mallampati: II TM Distance: >3 FB Neck ROM: Full    Dental  (+) Poor Dentition, Loose and Dental Advisory Given,    Pulmonary Recent URI  (hoarseness), Current Smoker,  breath sounds clear to auscultation  Pulmonary exam normal       Cardiovascular negative cardio ROS  Rhythm:Regular Rate:Normal     Neuro/Psych PSYCHIATRIC DISORDERS Anxiety Depression negative neurological ROS     GI/Hepatic Neg liver ROS, GERD-  Medicated and Poorly Controlled,(+)     substance abuse   ,   Endo/Other  Morbid obesitySLE: steroids  Renal/GU negative Renal ROS     Musculoskeletal  (+) Arthritis -, Osteoarthritis,    Abdominal (+) + obese,   Peds  Hematology negative hematology ROS (+)   Anesthesia Other Findings   Reproductive/Obstetrics                         Anesthesia Physical Anesthesia Plan  ASA: III and emergent  Anesthesia Plan: General   Post-op Pain Management:    Induction: Intravenous  Airway Management Planned: Oral ETT  Additional Equipment:   Intra-op Plan:   Post-operative Plan: Extubation in OR  Informed Consent: I have reviewed the patients History and Physical, chart, labs and discussed the procedure including the risks, benefits and alternatives for the proposed anesthesia with the patient or authorized representative who has indicated his/her understanding and acceptance.   Dental advisory given  Plan Discussed with: CRNA and Surgeon  Anesthesia Plan Comments: (Plan routine monitors, GETA)       Anesthesia Quick Evaluation

## 2012-09-25 NOTE — ED Provider Notes (Signed)
Medical screening examination/treatment/procedure(s) were performed by non-physician practitioner and as supervising physician I was immediately available for consultation/collaboration.  Olivia Mackie, MD 09/25/12 2106

## 2012-09-26 ENCOUNTER — Encounter (HOSPITAL_COMMUNITY): Payer: Self-pay | Admitting: Specialist

## 2012-09-26 LAB — COMPREHENSIVE METABOLIC PANEL
AST: 14 U/L (ref 0–37)
BUN: 12 mg/dL (ref 6–23)
CO2: 27 mEq/L (ref 19–32)
Calcium: 8.9 mg/dL (ref 8.4–10.5)
Chloride: 101 mEq/L (ref 96–112)
Creatinine, Ser: 0.83 mg/dL (ref 0.50–1.10)
GFR calc Af Amer: >90 mL/min (ref >90)
GFR calc non Af Amer: 86 mL/min — ABNORMAL LOW (ref >90)
Glucose, Bld: 103 mg/dL — ABNORMAL HIGH (ref 70–99)
Total Bilirubin: 0.3 mg/dL (ref 0.3–1.2)

## 2012-09-26 MED ORDER — CLONAZEPAM 1 MG PO TABS
1.0000 mg | ORAL_TABLET | Freq: Two times a day (BID) | ORAL | Status: DC | PRN
  Administered 2012-09-26 – 2012-09-28 (×4): 1 mg via ORAL
  Filled 2012-09-26 (×5): qty 1

## 2012-09-26 MED ORDER — PREDNISONE 10 MG PO TABS
10.0000 mg | ORAL_TABLET | Freq: Every day | ORAL | Status: DC
  Administered 2012-09-26 – 2012-09-29 (×4): 10 mg via ORAL
  Filled 2012-09-26 (×6): qty 1

## 2012-09-26 MED ORDER — METOCLOPRAMIDE HCL 5 MG/ML IJ SOLN: 5.0000 mg | Freq: Three times a day (TID) | INTRAMUSCULAR | Status: DC | PRN

## 2012-09-26 MED ORDER — ONDANSETRON HCL 4 MG/2ML IJ SOLN: 4.0000 mg | Freq: Four times a day (QID) | INTRAMUSCULAR | Status: DC | PRN

## 2012-09-26 MED ORDER — ENOXAPARIN SODIUM 30 MG/0.3ML ~~LOC~~ SOLN
30.0000 mg | Freq: Two times a day (BID) | SUBCUTANEOUS | Status: DC
  Administered 2012-09-26 – 2012-09-29 (×7): 30 mg via SUBCUTANEOUS
  Filled 2012-09-26 (×9): qty 0.3

## 2012-09-26 MED ORDER — BISACODYL 5 MG PO TBEC: 5.0000 mg | DELAYED_RELEASE_TABLET | Freq: Every day | ORAL | Status: DC | PRN

## 2012-09-26 MED ORDER — POLYETHYLENE GLYCOL 3350 17 G PO PACK: 17.0000 g | PACK | Freq: Every day | ORAL | Status: DC | PRN

## 2012-09-26 MED ORDER — METOCLOPRAMIDE HCL 10 MG PO TABS: 5.0000 mg | ORAL_TABLET | Freq: Three times a day (TID) | ORAL | Status: DC | PRN

## 2012-09-26 MED ORDER — HYDROCORTISONE SOD SUCCINATE 100 MG PF FOR IT USE
100.0000 mg | Freq: Two times a day (BID) | INTRAMUSCULAR | Status: DC
  Filled 2012-09-26: qty 1

## 2012-09-26 MED ORDER — DIPHENHYDRAMINE HCL 25 MG PO CAPS
25.0000 mg | ORAL_CAPSULE | Freq: Every evening | ORAL | Status: DC | PRN
  Administered 2012-09-26: 25 mg via ORAL
  Filled 2012-09-26: qty 1

## 2012-09-26 MED ORDER — DIPHENHYDRAMINE-APAP (SLEEP) 25-500 MG PO TABS: 1.0000 | ORAL_TABLET | Freq: Every evening | ORAL | Status: DC | PRN

## 2012-09-26 MED ORDER — DOCUSATE SODIUM 100 MG PO CAPS
100.0000 mg | ORAL_CAPSULE | Freq: Two times a day (BID) | ORAL | Status: DC
  Administered 2012-09-26 – 2012-09-29 (×8): 100 mg via ORAL
  Filled 2012-09-26 (×10): qty 1

## 2012-09-26 MED ORDER — SODIUM CHLORIDE 0.9 % IJ SOLN
10.0000 mL | INTRAMUSCULAR | Status: DC | PRN
  Administered 2012-09-28: 10 mL
  Administered 2012-09-29: 20 mL

## 2012-09-26 MED ORDER — PIPERACILLIN-TAZOBACTAM 3.375 G IVPB 30 MIN: 3.3750 g | Freq: Four times a day (QID) | INTRAVENOUS | Status: DC

## 2012-09-26 MED ORDER — HYDROXYZINE HCL 25 MG PO TABS
25.0000 mg | ORAL_TABLET | Freq: Four times a day (QID) | ORAL | Status: DC
  Administered 2012-09-26 – 2012-09-29 (×13): 25 mg via ORAL
  Filled 2012-09-26 (×18): qty 1

## 2012-09-26 MED ORDER — ENOXAPARIN SODIUM 30 MG/0.3ML ~~LOC~~ SOLN: 30.0000 mg | Freq: Two times a day (BID) | SUBCUTANEOUS | Status: DC

## 2012-09-26 MED ORDER — ONDANSETRON HCL 4 MG PO TABS: 4.0000 mg | ORAL_TABLET | Freq: Four times a day (QID) | ORAL | Status: DC | PRN

## 2012-09-26 MED ORDER — HYDROCODONE-ACETAMINOPHEN 5-325 MG PO TABS
1.0000 | ORAL_TABLET | ORAL | Status: DC | PRN
  Administered 2012-09-26 – 2012-09-29 (×8): 2 via ORAL
  Filled 2012-09-26 (×7): qty 2

## 2012-09-26 MED ORDER — IBUPROFEN 400 MG PO TABS
400.0000 mg | ORAL_TABLET | Freq: Four times a day (QID) | ORAL | Status: DC | PRN
  Filled 2012-09-26: qty 1

## 2012-09-26 MED ORDER — PIPERACILLIN-TAZOBACTAM 3.375 G IVPB 30 MIN
3.3750 g | Freq: Once | INTRAVENOUS | Status: AC
  Administered 2012-09-26: 3.375 g via INTRAVENOUS
  Filled 2012-09-26: qty 50

## 2012-09-26 MED ORDER — OXYCODONE HCL 5 MG PO TABS
5.0000 mg | ORAL_TABLET | ORAL | Status: DC | PRN
  Administered 2012-09-26 – 2012-09-29 (×13): 10 mg via ORAL
  Filled 2012-09-26 (×14): qty 2

## 2012-09-26 MED ORDER — FLEET ENEMA 7-19 GM/118ML RE ENEM: 1.0000 | ENEMA | Freq: Once | RECTAL | Status: AC | PRN

## 2012-09-26 MED ORDER — ACETAMINOPHEN 500 MG PO TABS
500.0000 mg | ORAL_TABLET | Freq: Every evening | ORAL | Status: DC | PRN
  Administered 2012-09-26: 500 mg via ORAL
  Filled 2012-09-26: qty 1

## 2012-09-26 MED ORDER — KCL IN DEXTROSE-NACL 20-5-0.45 MEQ/L-%-% IV SOLN
INTRAVENOUS | Status: DC
  Administered 2012-09-26 – 2012-09-28 (×2): via INTRAVENOUS
  Filled 2012-09-26 (×9): qty 1000

## 2012-09-26 MED ORDER — MORPHINE SULFATE 2 MG/ML IJ SOLN
1.0000 mg | INTRAMUSCULAR | Status: DC | PRN
  Administered 2012-09-26 – 2012-09-29 (×19): 1 mg via INTRAVENOUS
  Filled 2012-09-26 (×16): qty 1

## 2012-09-26 MED ORDER — PIPERACILLIN-TAZOBACTAM 3.375 G IVPB
3.3750 g | Freq: Three times a day (TID) | INTRAVENOUS | Status: DC
  Administered 2012-09-26 (×2): 3.375 g via INTRAVENOUS
  Filled 2012-09-26 (×4): qty 50

## 2012-09-26 NOTE — Progress Notes (Signed)
Orthopedic Tech Progress Note Patient Details:  [name] 08-Dec-1970 [id number]  Ortho Devices Type of Ortho Device: Postop boot Ortho Device/Splint Location: LEFT CAM WALKER  Ortho Device/Splint Interventions: Application   [name], [name] 09/26/2012, 9:47 AM

## 2012-09-26 NOTE — Progress Notes (Addendum)
Referral received for SNF. Chart reviewed and CSW has spoken with RNCM who indicates that patient is for DC to home with Home Health and DME.  Patient does not have any insurance.  Message left for financial counselor- Maddie for follow up. Patient has a Environmental education officer.    Patient will d/c home with [facility].   CSW to sign off. Please re-consult if CSW needs arise.  Lorri Frederick. [place]  [phone]

## 2012-09-26 NOTE — Progress Notes (Addendum)
CARE MANAGEMENT NOTE 09/26/2012  Patient:  [name], [name]   Account Number:  [id number]  Date Initiated:  09/26/2012  Documentation initiated by:  Vance Peper  Subjective/Objective Assessment:   41 yr old female s/p I & D and hardware removal Left leg.     Action/Plan:   HHRN for IV antibiotics.   Anticipated DC Date:     Anticipated DC Plan:  HOME W HOME HEALTH SERVICES      DC Planning Services  CM consult      [facility] Choice  HOME HEALTH   Choice offered to / List presented to:          [facility] arranged  HH-1 RN  HH-2 PT      [facility]   Status of service:  In process, will continue to follow Medicare Important Message given?   (If response is "NO", the following Medicare IM given date fields will be blank) Date Medicare IM given:   Date Additional Medicare IM given:    Discharge Disposition:       Per UR Regulation:    If discussed at Long Length of Stay Meetings, dates discussed:    Comments:

## 2012-09-26 NOTE — Progress Notes (Signed)
Patient ID: [name], female   DOB: 01/05/1971, 40 y.o.   MRN: [id number] This patient underwent I&D of left lateral ankle 09/25/2012 p.m. Cultures were sent for anaerobic aerobic and fungal analysis. Gram stain with moderate WBC polys and monos no organisms seen. She is on sulfa pre-surgery. Hardware has been removed from the lateral ankle and I expect her ability to heal the chronically draining ulcers overlying the left lateral ankle will be improved. 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 Will await the results of culture before discharge, hopefully then be able to adjust antibiotics to cover the bacteria with the most appropriate medication. She has tendency to anxiety and easily Tearful. She has been order dressing changes every shift.

## 2012-09-26 NOTE — Progress Notes (Signed)
Utilization review completed. Franklyn Cafaro, RN, BSN. 

## 2012-09-26 NOTE — Progress Notes (Signed)
[facility]  Patient Status: New  [facility] is providing the following services: RN, PT, MSW and Home Infusion Services (teaching and education will be done by nurse in the home with patient and caregiver)  MSW added to orders - pt is self pay/medicaid potential  If this patient d/ced over the weekend, will need to call [facility] with specific antibiotic orders and specific wound care orders.  Thanks!    If patient discharges after hours/weekend, please call [phone].   Jodene Nam  09/26/2012, 2:44 PM

## 2012-09-26 NOTE — Progress Notes (Signed)
Patient examined and lab reviewed with Vernon PA-C. 

## 2012-09-26 NOTE — Evaluation (Signed)
Physical Therapy Evaluation Patient Details Name: [name] MRN: [id number] DOB: 09/22/1971 Today's Date: 09/26/2012 Time: 4098-1191 PT Time Calculation (min): 20 min  PT Assessment / Plan / Recommendation Clinical Impression  Pt is a 41 y/o female admitted s/p left ankle hardware removal along with the below PT problem list. Pt would benefit from acute PT to maximize independence and faciilitate d/c home with HHPT.    PT Assessment  Patient needs continued PT services    Follow Up Recommendations  Home health PT    Does the patient have the potential to tolerate intense rehabilitation      Barriers to Discharge None      Equipment Recommendations  Rolling walker with 5" wheels    Recommendations for Other Services     Frequency Min 5X/week    Precautions / Restrictions Precautions Precautions: Fall Required Braces or Orthoses: Other Brace/Splint Other Brace/Splint: Left ankle CAM walker. Restrictions Weight Bearing Restrictions: Yes LLE Weight Bearing: Partial weight bearing LLE Partial Weight Bearing Percentage or Pounds: 50   Pertinent Vitals/Pain 10/10 in left ankle. Pt repositioned and RN aware.      Mobility  Bed Mobility Bed Mobility: Supine to Sit;Sit to Supine Supine to Sit: 4: Min assist;HOB flat Sit to Supine: 4: Min assist;HOB flat Details for Bed Mobility Assistance: Assist for left LE due to pain. Cues for sequence and to protect left LE. Transfers Transfers: Sit to Stand;Stand to Sit Sit to Stand: 4: Min assist;With upper extremity assist;From bed Stand to Sit: 4: Min assist;With upper extremity assist;To bed Details for Transfer Assistance: Assist for balance due to pain. Cues for safest hand placement. Ambulation/Gait Ambulation/Gait Assistance: 4: Min assist Ambulation Distance (Feet): 5 Feet Assistive device: Rolling walker Ambulation/Gait Assistance Details: Assist for balance and to off weight left LE. Cues for sequence and safety. Gait  Pattern: Step-to pattern;Decreased step length - left;Decreased stance time - left;Antalgic;Trunk flexed Stairs: No Wheelchair Mobility Wheelchair Mobility: No    Shoulder Instructions     Exercises     PT Diagnosis: Difficulty walking;Acute pain  PT Problem List: Decreased strength;Decreased activity tolerance;Decreased balance;Decreased mobility;Decreased knowledge of use of DME;Decreased knowledge of precautions;Pain PT Treatment Interventions: DME instruction;Gait training;Functional mobility training;Stair training;Therapeutic activities;Balance training;Patient/family education   PT Goals Acute Rehab PT Goals PT Goal Formulation: With patient Time For Goal Achievement: 10/03/12 Potential to Achieve Goals: Good Pt will go Supine/Side to Sit: with modified independence PT Goal: Supine/Side to Sit - Progress: Goal set today Pt will go Sit to Supine/Side: with modified independence PT Goal: Sit to Supine/Side - Progress: Goal set today Pt will go Sit to Stand: with modified independence PT Goal: Sit to Stand - Progress: Goal set today Pt will go Stand to Sit: with modified independence PT Goal: Stand to Sit - Progress: Goal set today Pt will Ambulate: >150 feet;with modified independence;with least restrictive assistive device PT Goal: Ambulate - Progress: Goal set today Pt will Go Up / Down Stairs: Flight;with min assist;with least restrictive assistive device PT Goal: Up/Down Stairs - Progress: Goal set today  Visit Information  Last PT Received On: 09/26/12 Assistance Needed: +1    Subjective Data  Subjective: "This boot makes my leg weigh 300 pounds!" Patient Stated Goal: Go home.   Prior Functioning  Home Living Lives With: Family Available Help at Discharge: Family;Available PRN/intermittently Type of Home: Apartment Home Access: Stairs to enter Entrance Stairs-Number of Steps: 20 Entrance Stairs-Rails: Right Home Layout: One level Home Adaptive Equipment:  None  Prior Function Level of Independence: Independent Able to Take Stairs?: Yes Driving: Yes Vocation: Retired Musician: No difficulties    Cognition  Overall Cognitive Status: Appears within functional limits for tasks assessed/performed Arousal/Alertness: Lethargic Orientation Level: Appears intact for tasks assessed Behavior During Session: [facility] for tasks performed    Extremity/Trunk Assessment Right Upper Extremity Assessment RUE ROM/Strength/Tone: Within functional levels RUE Sensation: WFL - Light Touch RUE Coordination: WFL - gross/fine motor Left Upper Extremity Assessment LUE ROM/Strength/Tone: Within functional levels LUE Sensation: WFL - Light Touch LUE Coordination: WFL - gross/fine motor Right Lower Extremity Assessment RLE ROM/Strength/Tone: Within functional levels RLE Sensation: WFL - Light Touch RLE Coordination: WFL - gross/fine motor Left Lower Extremity Assessment LLE ROM/Strength/Tone: Deficits;Due to pain LLE ROM/Strength/Tone Deficits: 2/5 throughout. LLE Sensation: WFL - Light Touch LLE Coordination: WFL - gross motor Trunk Assessment Trunk Assessment: Normal   Balance Balance Balance Assessed: No  End of Session PT - End of Session Equipment Utilized During Treatment: Gait belt;Other (comment) (Left LE CAM walker.) Activity Tolerance: Patient tolerated treatment well;Patient limited by pain Patient left: in bed;with call bell/phone within reach Nurse Communication: Mobility status;Patient requests pain meds  GP     Cephus Shelling 09/26/2012, 11:25 AM  09/26/2012 Cephus Shelling, PT, DPT [phone]

## 2012-09-26 NOTE — Progress Notes (Signed)
Peripherally Inserted Central Catheter/Midline Placement  The IV Nurse has discussed with the patient and/or persons authorized to consent for the patient, the purpose of this procedure and the potential benefits and risks involved with this procedure.  The benefits include less needle sticks, lab draws from the catheter and patient may be discharged home with the catheter.  Risks include, but not limited to, infection, bleeding, blood clot (thrombus formation), and puncture of an artery; nerve damage and irregular heat beat.  Alternatives to this procedure were also discussed.  PICC/Midline Placement Documentation        [name] 09/26/2012, 1:32 PM

## 2012-09-26 NOTE — Progress Notes (Signed)
Subjective: 1 Day Post-Op Procedure(s) (LRB): HARDWARE REMOVAL (Left) Patient reports pain as severe.  Reports severe pain since drssing change and boot application  Objective: Vital signs in last 24 hours: Temp:  [96.8 F (36 C)-97.9 F (36.6 C)] 97.9 F (36.6 C) (11/08 0623) Pulse Rate:  [64-109] 64  (11/08 0623) Resp:  [18-30] 18  (11/08 0623) BP: (110-130)/(75-103) 125/86 mmHg (11/08 0623) SpO2:  [95 %-100 %] 99 % (11/08 0623) Weight:  [92.987 kg (205 lb)] 92.987 kg (205 lb) (11/07 1433)  Intake/Output from previous day: 11/07 0701 - 11/08 0700 In: 1000 [I.V.:1000] Out: 45 [Urine:20; Blood:25] Intake/Output this shift:     Basename 09/25/12 1536  HGB 13.7    Basename 09/25/12 1536  WBC 9.5  RBC 4.76  HCT 40.8  PLT 291    Basename 09/26/12 0540 09/25/12 1536  NA 136 136  K 3.7 4.7  CL 101 99  CO2 27 26  BUN 12 14  CREATININE 0.83 0.79  GLUCOSE 103* 98  CALCIUM 8.9 9.3   No results found for this basename: LABPT:2,INR:2 in the last 72 hours  pt very anxious currently and shaking and crying.  cap refill of toes intact and dressing intact as it has just been changed  Assessment/Plan: 1 Day Post-Op Procedure(s) (LRB): HARDWARE REMOVAL (Left) Up with therapy.  Non weight bearing on operative leg. Will use vistaril for anxiety and itching.  Has clonazepam prn also Cultures neg thus far.  Cont IV vancomycin. Strict elevation of leg  [name] 09/26/2012, 9:52 AM

## 2012-09-26 NOTE — Progress Notes (Signed)
Pt  Has open sore areas all over body pt scratchinig areas states she has a hx of mrsa order for contact

## 2012-09-27 LAB — URINE CULTURE: Colony Count: 35000

## 2012-09-27 LAB — COMPREHENSIVE METABOLIC PANEL
ALT: 14 U/L (ref 0–35)
Albumin: 2.8 g/dL — ABNORMAL LOW (ref 3.5–5.2)
Alkaline Phosphatase: 82 U/L (ref 39–117)
BUN: 13 mg/dL (ref 6–23)
Potassium: 3.9 mEq/L (ref 3.5–5.1)
Sodium: 138 mEq/L (ref 135–145)
Total Protein: 6.3 g/dL (ref 6.0–8.3)

## 2012-09-27 LAB — SEDIMENTATION RATE: Sed Rate: 37 mm/hr — ABNORMAL HIGH (ref 0–22)

## 2012-09-27 MED ORDER — RIFAMPIN 300 MG PO CAPS
300.0000 mg | ORAL_CAPSULE | Freq: Two times a day (BID) | ORAL | Status: DC
  Administered 2012-09-27 – 2012-09-29 (×5): 300 mg via ORAL
  Filled 2012-09-27 (×6): qty 1

## 2012-09-27 NOTE — Progress Notes (Signed)
Physical Therapy Treatment Patient Details Name: [name] MRN: [id number] DOB: Apr 11, 1971 Today's Date: 09/27/2012 Time: 7829-5621 PT Time Calculation (min): 23 min  PT Assessment / Plan / Recommendation Comments on Treatment Session  Progressing towards goals with increased gait distance today and less assistance needed with mobility. Does tend to be impulsive at times and needs cues to slow down for safety. Reinforced with pt need to have the cam boot on with all out of bed activity. Cam boot donned at edge of bed before getting up. Pt able to maintain 50% PWB without cues or assistance.             Follow Up Recommendations  Home health PT           Equipment Recommendations  Rolling walker with 5" wheels       Frequency Min 5X/week   Plan Discharge plan remains appropriate;Frequency remains appropriate    Precautions / Restrictions Precautions Precautions: Fall Required Braces or Orthoses: Other Brace/Splint Other Brace/Splint: left LE cam boot Restrictions Weight Bearing Restrictions: Yes LLE Weight Bearing: Partial weight bearing LLE Partial Weight Bearing Percentage or Pounds: 50% PWB       Mobility  Bed Mobility Supine to Sit: 5: Supervision;HOB elevated (HOB approx 30 degrees elevated) Details for Bed Mobility Assistance: pt able to move self to edge of bed with min cues on technique. Transfers Sit to Stand: 5: Supervision;From bed;With upper extremity assist;From toilet Stand to Sit: 5: Supervision;With upper extremity assist;To toilet;To chair/3-in-1 Details for Transfer Assistance: cues for hand placment for safety. Ambulation/Gait Ambulation/Gait Assistance: 4: Min guard Ambulation Distance (Feet): 16 Feet (16 feet x2 trials) Assistive device: Rolling walker Ambulation/Gait Assistance Details: cues for posture, walker safety and positon with gait and for sequence intially. Gait Pattern: Step-to pattern;Step-through pattern;Decreased stance time -  left;Decreased step length - right;Trunk flexed      PT Goals Acute Rehab PT Goals PT Goal: Supine/Side to Sit - Progress: Progressing toward goal PT Goal: Sit to Stand - Progress: Progressing toward goal PT Goal: Stand to Sit - Progress: Progressing toward goal PT Goal: Ambulate - Progress: Progressing toward goal  Visit Information  Last PT Received On: 09/27/12 Assistance Needed: +1    Subjective Data  Subjective: 'Oh, am I supposed to wear that?" in reference to cam boot. Reported pain as 8/10 currently.   Cognition  Overall Cognitive Status: Appears within functional limits for tasks assessed/performed Arousal/Alertness: Awake/alert Orientation Level: Appears intact for tasks assessed Behavior During Session: [facility] for tasks performed       End of Session PT - End of Session Equipment Utilized During Treatment: Gait belt;Other (comment) (cam boot left LE) Activity Tolerance: Patient tolerated treatment well;Patient limited by pain Patient left: in chair;with call bell/phone within reach;with nursing in room Nurse Communication: Mobility status;Patient requests pain meds;Weight bearing status   GP     Sallyanne Kuster 09/27/2012, 3:17 PM  Sallyanne Kuster, PTA Office- [phone]

## 2012-09-27 NOTE — Progress Notes (Signed)
Patient ID: REVELLA LIKE, female   DOB: [birth year], 41 y.o.   MRN: [id number] No acute changes.  Left ankle dressing clean and intact.  Toes well-perfused.  Comfortable overall.  ID consult has been following and has added rifampin to the vanc.  Awaiting final culture results.

## 2012-09-27 NOTE — Progress Notes (Signed)
[facility]  Total days of antibiotics 2 Day 2 vancomycin  Day  2 zosyn   Subjective: No new complaints   Antibiotics:  Anti-infectives     Start     Dose/Rate Route Frequency Ordered Stop   09/26/12 0800   piperacillin-tazobactam (ZOSYN) IVPB 3.375 g  Status:  Discontinued        3.375 g 12.5 mL/hr over 240 Minutes Intravenous Every 8 hours 09/26/12 0017 09/27/12 0751   09/26/12 0030  piperacillin-tazobactam (ZOSYN) IVPB 3.375 g       3.375 g 100 mL/hr over 30 Minutes Intravenous  Once 09/26/12 0017 09/26/12 0139   09/26/12 0015   piperacillin-tazobactam (ZOSYN) IVPB 3.375 g  Status:  Discontinued        3.375 g 100 mL/hr over 30 Minutes Intravenous 4 times per day 09/26/12 0007 09/26/12 0016   09/26/12 0000   vancomycin (VANCOCIN) IVPB 1000 mg/200 mL premix        1,000 mg 200 mL/hr over 60 Minutes Intravenous Every 8 hours 09/25/12 2320     09/25/12 2145   polymyxin B 500,000 Units, bacitracin 50,000 Units in sodium chloride irrigation 0.9 % 500 mL irrigation  Status:  Discontinued          As needed 09/25/12 2145 09/25/12 2150          Medications: Scheduled Meds:   . docusate sodium  100 mg Oral BID  . enoxaparin (LOVENOX) injection  30 mg Subcutaneous Q12H  . hydrOXYzine  25 mg Oral Q6H  . predniSONE  10 mg Oral Q breakfast  . vancomycin  1,000 mg Intravenous Q8H  . [DISCONTINUED] hydrocortisone sodium succinate  100 mg Intrathecal BID  . [DISCONTINUED] piperacillin-tazobactam (ZOSYN)  IV  3.375 g Intravenous Q8H   Continuous Infusions:   . dextrose 5 % and 0.45 % NaCl with KCl 20 mEq/L 75 mL/hr at 09/27/12 0500   PRN Meds:.acetaminophen, bisacodyl, clonazePAM, diphenhydrAMINE, HYDROcodone-acetaminophen, ibuprofen, metoCLOPramide (REGLAN) injection, metoCLOPramide, morphine injection, ondansetron (ZOFRAN) IV, ondansetron, oxyCODONE, polyethylene glycol, sodium chloride, [EXPIRED] sodium phosphate   Objective: Weight change:    Intake/Output Summary (Last 24 hours) at 09/27/12 0940 Last data filed at 09/27/12 0500  Gross per 24 hour  Intake 3781.25 ml  Output    155 ml  Net 3626.25 ml   Blood pressure 100/48, pulse 101, temperature 97.6 F (36.4 C), temperature source Oral, resp. rate 20, height 5\' 3"  (1.6 m), weight 205 lb (92.987 kg), last menstrual period 09/21/2012, SpO2 97.00%. Temp:  [97.6 F (36.4 C)-98.4 F (36.9 C)] 97.6 F (36.4 C) (11/09 0930) Pulse Rate:  [88-103] 101  (11/09 0930) Resp:  [16-20] 20  (11/09 0930) BP: (100-130)/(48-67) 100/48 mmHg (11/09 0930) SpO2:  [94 %-99 %] 97 % (11/09 0930)  Physical Exam: General: Alert and awake, oriented x3, not in any acute distress. HEENT: anicteric sclera, pupils reactive to light and accommodation, EOMI CVS regular rate, normal r,  no murmur rubs or gallops Chest: clear to auscultation bilaterally, = Abdomen: soft nontender, nondistended, Extremities: ankle with dressing in place Neuro: nonfocal  Lab Results:  Basename 09/25/12 1536  WBC 9.5  HGB 13.7  HCT 40.8  PLT 291    BMET  Basename 09/26/12 0540 09/25/12 1536  NA 136 136  K 3.7 4.7  CL 101 99  CO2 27 26  GLUCOSE 103* 98  BUN 12 14  CREATININE 0.83 0.79  CALCIUM 8.9 9.3    Micro Results: Recent Results (from  the past 240 hour(s))  URINE CULTURE     Status: Normal   Collection Time   09/25/12  2:33 PM      Component Value Range Status Comment   Specimen Description URINE, CLEAN CATCH   Final    Special Requests NONE   Final    Culture  Setup Time 09/25/2012 15:53   Final    Colony Count 35,000 COLONIES/ML   Final    Culture ESCHERICHIA COLI   Final    Report Status 09/27/2012 FINAL   Final    Organism ID, Bacteria ESCHERICHIA COLI   Final   SURGICAL PCR SCREEN     Status: Normal   Collection Time   09/25/12  2:34 PM      Component Value Range Status Comment   MRSA, PCR NEGATIVE  NEGATIVE Final    Staphylococcus aureus NEGATIVE  NEGATIVE Final   CULTURE,  ROUTINE-ABSCESS     Status: Normal (Preliminary result)   Collection Time   09/25/12  9:40 PM      Component Value Range Status Comment   Specimen Description ABSCESS LEFT FOOT   Final    Special Requests PATIENT ON FOLLOWING SULFA   Final    Gram Stain     Final    Value: MODERATE WBC PRESENT,BOTH PMN AND MONONUCLEAR     NO ORGANISMS SEEN     Performed at [facility]   Culture     Final    Value: FEW STAPHYLOCOCCUS AUREUS     Note: RIFAMPIN AND GENTAMICIN SHOULD NOT BE USED AS SINGLE DRUGS FOR TREATMENT OF STAPH INFECTIONS.   Report Status PENDING   Incomplete   ANAEROBIC CULTURE     Status: Normal (Preliminary result)   Collection Time   09/25/12  9:40 PM      Component Value Range Status Comment   Specimen Description ABSCESS LEFT FOOT   Final    Special Requests PATIENT ON FOLLOWING SULFA   Final    Gram Stain     Final    Value: MODERATE WBC PRESENT,BOTH PMN AND MONONUCLEAR     NO ORGANISMS SEEN     Performed at [facility]   Culture     Final    Value: NO ANAEROBES ISOLATED; CULTURE IN PROGRESS FOR 5 DAYS   Report Status PENDING   Incomplete   GRAM STAIN     Status: Normal   Collection Time   09/25/12  9:40 PM      Component Value Range Status Comment   Specimen Description ABSCESS LEFT FOOT   Final    Special Requests PATIENT ON FOLLOWING SULFA   Final    Gram Stain     Final    Value: MODERATE WBC PRESENT,BOTH PMN AND MONONUCLEAR     NO ORGANISMS SEEN   Report Status 09/25/2012 FINAL   Final   FUNGUS CULTURE W SMEAR     Status: Normal (Preliminary result)   Collection Time   09/25/12 10:07 PM      Component Value Range Status Comment   Specimen Description TISSUE LEFT FOOT   Final    Special Requests PATIENT ON FOLLOWING SULFA   Final    Fungal Smear NO YEAST OR FUNGAL ELEMENTS SEEN   Final    Culture CULTURE IN PROGRESS FOR FOUR WEEKS   Final    Report Status PENDING   Incomplete     Studies/Results: No results found.    Assessment/Plan: Shelby  Franchot [name]  is a 41 y.o. 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  --I UNDERSTAND pt still with hardware on the opposite side (medial)  Therefor would add rifampin IN CASE this site might be seeded - would recommend to treat for 4 wks of IV antibiotics, and then consider oral abx afterwards    LOS: 2 days   Acey Lav 09/27/2012, 9:40 AM

## 2012-09-28 LAB — COMPREHENSIVE METABOLIC PANEL
ALT: 13 U/L (ref 0–35)
AST: 11 U/L (ref 0–37)
CO2: 28 mEq/L (ref 19–32)
Chloride: 106 mEq/L (ref 96–112)
Creatinine, Ser: 0.68 mg/dL (ref 0.50–1.10)
GFR calc non Af Amer: >90 mL/min (ref >90)
Total Bilirubin: 0.4 mg/dL (ref 0.3–1.2)

## 2012-09-28 LAB — CULTURE, ROUTINE-ABSCESS

## 2012-09-28 MED ORDER — CEFAZOLIN SODIUM-DEXTROSE 2-3 GM-% IV SOLR
2.0000 g | Freq: Three times a day (TID) | INTRAVENOUS | Status: DC
  Administered 2012-09-28 – 2012-09-29 (×3): 2 g via INTRAVENOUS
  Filled 2012-09-28 (×5): qty 50

## 2012-09-28 NOTE — Progress Notes (Signed)
Patient ID: [name], female   DOB: 1971-05-14, 41 y.o.   MRN: [id number] No acute changes.  Working with therapy on balance.  Cultures show staph aureus, bu no final report yet on sensitivities.

## 2012-09-28 NOTE — Progress Notes (Signed)
Physical Therapy Treatment Patient Details Name: [name] MRN: [id number] DOB: [birth year] Today's Date: 09/28/2012 Time: 4098-1191 PT Time Calculation (min): 27 min  PT Assessment / Plan / Recommendation Comments on Treatment Session  Pt moving fairly well despite pain. (9/10 at beginning of session & 7/10 at end of session)  Pt was able to initiate stair training this session.  Did well with stairs.  Cont's to be impulsive at times, improving by end of session.  Encouraged pt to wear shoe on Rt foot to provide increased stability/balance.      Follow Up Recommendations  Home health PT     Does the patient have the potential to tolerate intense rehabilitation     Barriers to Discharge        Equipment Recommendations  Rolling walker with 5" wheels    Recommendations for Other Services    Frequency Min 5X/week   Plan Discharge plan remains appropriate;Frequency remains appropriate    Precautions / Restrictions Precautions Precautions: Fall Required Braces or Orthoses: Other Brace/Splint Other Brace/Splint: left LE cam boot Restrictions LLE Weight Bearing: Partial weight bearing LLE Partial Weight Bearing Percentage or Pounds: 50   Pertinent Vitals/Pain 9/10  Lt ankle upon arrival.   7/10 Lt ankle at end of session.  Pain meds administered by RN at beginning of session.      Mobility  Bed Mobility Bed Mobility: Supine to Sit;Sitting - Scoot to Edge of Bed Supine to Sit: 6: Modified independent (Device/Increase time) Sitting - Scoot to Edge of Bed: 6: Modified independent (Device/Increase time) Transfers Transfers: Sit to Stand;Stand to Sit Sit to Stand: 5: Supervision;With upper extremity assist;With armrests;From bed;From chair/3-in-1 Stand to Sit: 5: Supervision;With upper extremity assist;With armrests;To chair/3-in-1 Details for Transfer Assistance: Cues for safe hand placement, increased safety (pt attempting to stand before RW in place),  & body positioning before  sitting.   Ambulation/Gait Ambulation/Gait Assistance: 4: Min guard Ambulation Distance (Feet): 40 Feet Assistive device: Rolling walker Ambulation/Gait Assistance Details: Cues for smooth use of RW, posture, sequencing, & to ensure balance before taking next step.    Gait Pattern: Step-through pattern Stairs: Yes Stairs Assistance: 4: Min assist Stairs Assistance Details (indicate cue type and reason): Pt used rail on Rt + placed her Lt UE around PTA's shoulders for support.  Cues for sequencing & technique.   Stair Management Technique: One rail Right;Other (comment) (Lt UE placed around clinicians shoulderss) Number of Stairs: 12  Wheelchair Mobility Wheelchair Mobility: No       PT Goals Acute Rehab PT Goals Time For Goal Achievement: 10/03/12 Potential to Achieve Goals: Good Pt will go Supine/Side to Sit: with modified independence PT Goal: Supine/Side to Sit - Progress: Met Pt will go Sit to Supine/Side: with modified independence Pt will go Sit to Stand: with modified independence PT Goal: Sit to Stand - Progress: Progressing toward goal Pt will go Stand to Sit: with modified independence PT Goal: Stand to Sit - Progress: Progressing toward goal Pt will Ambulate: >150 feet;with modified independence;with least restrictive assistive device PT Goal: Ambulate - Progress: Progressing toward goal Pt will Go Up / Down Stairs: Flight;with min assist;with least restrictive assistive device PT Goal: Up/Down Stairs - Progress: Progressing toward goal  Visit Information  Last PT Received On: 09/28/12 Assistance Needed: +1    Subjective Data  Subjective: "why are they leaving it open"   Cognition  Overall Cognitive Status: Appears within functional limits for tasks assessed/performed Arousal/Alertness: Awake/alert Orientation Level: Appears intact for  tasks assessed Behavior During Session: [facility] for tasks performed Cognition - Other Comments: Pt emotional ocassionally during  session.  Really wanting to go home.      Balance  Balance Balance Assessed: No  End of Session PT - End of Session Equipment Utilized During Treatment: Gait belt;Other (comment) (cam boo Lt LE) Activity Tolerance: Patient tolerated treatment well Patient left: in chair;with call bell/phone within reach Nurse Communication: Mobility status;Patient requests pain meds    Verdell Face, [place] [phone] 09/28/2012

## 2012-09-28 NOTE — Progress Notes (Signed)
[facility]   Total days of antibiotics3 Day 3vancomycin  Day 2 rifampin  2 days  zosyn   Subjective: No new complaints   Antibiotics:  Anti-infectives     Start     Dose/Rate Route Frequency Ordered Stop   09/28/12 1445   ceFAZolin (ANCEF) IVPB 2 g/50 mL premix        2 g 100 mL/hr over 30 Minutes Intravenous 3 times per day 09/28/12 1437     09/27/12 1100   rifampin (RIFADIN) capsule 300 mg        300 mg Oral Every 12 hours 09/27/12 0953     09/26/12 0800   piperacillin-tazobactam (ZOSYN) IVPB 3.375 g  Status:  Discontinued        3.375 g 12.5 mL/hr over 240 Minutes Intravenous Every 8 hours 09/26/12 0017 09/27/12 0751   09/26/12 0030   piperacillin-tazobactam (ZOSYN) IVPB 3.375 g        3.375 g 100 mL/hr over 30 Minutes Intravenous  Once 09/26/12 0017 09/26/12 0139   09/26/12 0015   piperacillin-tazobactam (ZOSYN) IVPB 3.375 g  Status:  Discontinued        3.375 g 100 mL/hr over 30 Minutes Intravenous 4 times per day 09/26/12 0007 09/26/12 0016   09/26/12 0000   vancomycin (VANCOCIN) IVPB 1000 mg/200 mL premix  Status:  Discontinued        1,000 mg 200 mL/hr over 60 Minutes Intravenous Every 8 hours 09/25/12 2320 09/28/12 1437   09/25/12 2145   polymyxin B 500,000 Units, bacitracin 50,000 Units in sodium chloride irrigation 0.9 % 500 mL irrigation  Status:  Discontinued          As needed 09/25/12 2145 09/25/12 2150          Medications: Scheduled Meds:    .  ceFAZolin (ANCEF) IV  2 g Intravenous Q8H  . docusate sodium  100 mg Oral BID  . enoxaparin (LOVENOX) injection  30 mg Subcutaneous Q12H  . hydrOXYzine  25 mg Oral Q6H  . predniSONE  10 mg Oral Q breakfast  . rifampin  300 mg Oral Q12H  . [DISCONTINUED] vancomycin  1,000 mg Intravenous Q8H   Continuous Infusions:    . dextrose 5 % and 0.45 % NaCl with KCl 20 mEq/L 75 mL/hr at 09/28/12 0830   PRN Meds:.acetaminophen, bisacodyl, clonazePAM, diphenhydrAMINE,  HYDROcodone-acetaminophen, ibuprofen, metoCLOPramide (REGLAN) injection, metoCLOPramide, morphine injection, ondansetron (ZOFRAN) IV, ondansetron, oxyCODONE, polyethylene glycol, sodium chloride   Objective: Weight change:   Intake/Output Summary (Last 24 hours) at 09/28/12 1439 Last data filed at 09/28/12 0800  Gross per 24 hour  Intake   2820 ml  Output      0 ml  Net   2820 ml   Blood pressure 118/65, pulse 95, temperature 97.8 F (36.6 C), temperature source Oral, resp. rate 18, height 5\' 3"  (1.6 m), weight 205 lb (92.987 kg), last menstrual period 09/21/2012, SpO2 95.00%. Temp:  [97.6 F (36.4 C)-98.1 F (36.7 C)] 97.8 F (36.6 C) (11/10 1409) Pulse Rate:  [86-95] 95  (11/10 1409) Resp:  [18] 18  (11/10 1409) BP: (112-127)/(65-70) 118/65 mmHg (11/10 1409) SpO2:  [95 %-99 %] 95 % (11/10 1409)  Physical Exam: General: Alert and awake, oriented x3, not in any acute distress. HEENT: anicteric sclera, pupils reactive to light and accommodation, EOMI CVS regular rate, normal r,  no murmur rubs or gallops Chest: clear to auscultation bilaterally, = Abdomen: soft nontender, nondistended, Extremities:  ankle with dressing in place Neuro: nonfocal  Lab Results:  [facility] 09/25/12 1536  WBC 9.5  HGB 13.7  HCT 40.8  PLT 291    BMET  Basename 09/28/12 0510 09/27/12 0850  NA 142 138  K 3.7 3.9  CL 106 101  CO2 28 28  GLUCOSE 83 100*  BUN 14 13  CREATININE 0.68 0.76  CALCIUM 9.3 8.9    Micro Results: Recent Results (from the past 240 hour(s))  URINE CULTURE     Status: Normal   Collection Time   09/25/12  2:33 PM      Component Value Range Status Comment   Specimen Description URINE, CLEAN CATCH   Final    Special Requests NONE   Final    Culture  Setup Time 09/25/2012 15:53   Final    Colony Count 35,000 COLONIES/ML   Final    Culture ESCHERICHIA COLI   Final    Report Status 09/27/2012 FINAL   Final    Organism ID, Bacteria ESCHERICHIA COLI   Final   SURGICAL  PCR SCREEN     Status: Normal   Collection Time   09/25/12  2:34 PM      Component Value Range Status Comment   MRSA, PCR NEGATIVE  NEGATIVE Final    Staphylococcus aureus NEGATIVE  NEGATIVE Final   CULTURE, ROUTINE-ABSCESS     Status: Normal   Collection Time   09/25/12  9:40 PM      Component Value Range Status Comment   Specimen Description ABSCESS LEFT FOOT   Final    Special Requests PATIENT ON FOLLOWING SULFA   Final    Gram Stain     Final    Value: MODERATE WBC PRESENT,BOTH PMN AND MONONUCLEAR     NO ORGANISMS SEEN     Performed at [facility]   Culture     Final    Value: FEW STAPHYLOCOCCUS AUREUS     Note: RIFAMPIN AND GENTAMICIN SHOULD NOT BE USED AS SINGLE DRUGS FOR TREATMENT OF STAPH INFECTIONS.   Report Status 09/28/2012 FINAL   Final    Organism ID, Bacteria STAPHYLOCOCCUS AUREUS   Final   ANAEROBIC CULTURE     Status: Normal (Preliminary result)   Collection Time   09/25/12  9:40 PM      Component Value Range Status Comment   Specimen Description ABSCESS LEFT FOOT   Final    Special Requests PATIENT ON FOLLOWING SULFA   Final    Gram Stain     Final    Value: MODERATE WBC PRESENT,BOTH PMN AND MONONUCLEAR     NO ORGANISMS SEEN     Performed at [facility]   Culture     Final    Value: NO ANAEROBES ISOLATED; CULTURE IN PROGRESS FOR 5 DAYS   Report Status PENDING   Incomplete   GRAM STAIN     Status: Normal   Collection Time   09/25/12  9:40 PM      Component Value Range Status Comment   Specimen Description ABSCESS LEFT FOOT   Final    Special Requests PATIENT ON FOLLOWING SULFA   Final    Gram Stain     Final    Value: MODERATE WBC PRESENT,BOTH PMN AND MONONUCLEAR     NO ORGANISMS SEEN   Report Status 09/25/2012 FINAL   Final   FUNGUS CULTURE W SMEAR     Status: Normal (Preliminary result)   Collection Time   09/25/12  10:07 PM      Component Value Range Status Comment   Specimen Description TISSUE LEFT FOOT   Final    Special Requests PATIENT  ON FOLLOWING SULFA   Final    Fungal Smear NO YEAST OR FUNGAL ELEMENTS SEEN   Final    Culture CULTURE IN PROGRESS FOR FOUR WEEKS   Final    Report Status PENDING   Incomplete     Studies/Results: No results found.    Assessment/Plan: [name] is a 41 y.o. 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 Hardware infection sp Incision, drainage and excisional debridement of left lateral ankle, removal of plate and screws  With intraoperative cultures growing MS S.Aureus. 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 Should get weekly cbc and bmp as safety labs --continue at least 4 weeks of post operative antibiotics with Fu in [facility] prior to stopping antibiotics --tentative abx stop date =  December 5th BUT I would like her seen by one of my partners prior to stopping abx --will sign off for now please call with any questions.   LOS: 3 days   Acey Lav 09/28/2012, 2:39 PM

## 2012-09-28 NOTE — Progress Notes (Signed)
Pt reported to the CNA that she fell while going to the bedside commode.  Pt was found in bed. Pt stated " called for assistance, did not wait for help" Pts vitals were recorded. Pt stable, no injuries visible.  Md notified.  No actions taken at this time. Will continue to monitor. Wiliam Ke Adiel 09/27/2012 [id number]

## 2012-09-29 MED ORDER — HEPARIN SOD (PORK) LOCK FLUSH 100 UNIT/ML IV SOLN
250.0000 [IU] | Freq: Every day | INTRAVENOUS | Status: DC
  Filled 2012-09-29: qty 3

## 2012-09-29 MED ORDER — HEPARIN SOD (PORK) LOCK FLUSH 100 UNIT/ML IV SOLN
250.0000 [IU] | INTRAVENOUS | Status: DC | PRN
  Administered 2012-09-29: 500 [IU]
  Filled 2012-09-29: qty 3

## 2012-09-29 MED ORDER — OXYCODONE HCL 5 MG PO TABS: 5.0000 mg | ORAL_TABLET | ORAL | Status: DC | PRN

## 2012-09-29 MED ORDER — HYDROXYZINE HCL 25 MG PO TABS: 25.0000 mg | ORAL_TABLET | Freq: Four times a day (QID) | ORAL | Status: AC

## 2012-09-29 MED ORDER — CEFAZOLIN SODIUM-DEXTROSE 2-3 GM-% IV SOLR: 2.0000 g | Freq: Three times a day (TID) | INTRAVENOUS | Status: DC

## 2012-09-29 MED ORDER — RIFAMPIN 300 MG PO CAPS: 300.0000 mg | ORAL_CAPSULE | Freq: Two times a day (BID) | ORAL | Status: DC

## 2012-09-29 NOTE — Progress Notes (Signed)
Subjective: 4 Days Post-Op Procedure(s) (LRB): HARDWARE REMOVAL (Left) Tolerating by mouth narcotic in the sense desires medication that has Tylenol and it to decrease effect on liver. Patient reports pain as moderate. Dressing is being changed every shift appears to be granulating. Infectious disease has signed off on the patient's case indicating that she should continue on Ancef 2 g IV q. 8 hours and rifampin 300 mg by mouth q12 hours. She is to be treated as for osteomyelitis and continue with at least 4 weeks postoperative antibiotics followup with infectious disease prior to stopping antibiotics prior to discontinuing her PICC line. Tentative antibiotic stop date is December 5. She should be seen by infectious disease prior to that.  Objective:   VITALS:  Temp:  [97.6 F (36.4 C)-98 F (36.7 C)] 97.6 F (36.4 C) (11/11 0616) Pulse Rate:  [87-95] 87  (11/11 0616) Resp:  [16-18] 16  (11/11 0616) BP: (90-118)/(50-65) 95/50 mmHg (11/11 0616) SpO2:  [94 %-95 %] 94 % (11/11 0616)  Neurologically intact ABD soft Neurovascular intact Sensation intact distally Intact pulses distally Compartment soft Wound left lateral distal leg with granulation suture line intact. No purulence noted.   LABS No results found for this basename: HGB:5,WBC:2,PLT:2 in the last 72 hours  Basename 09/28/12 0510 09/27/12 0850  NA 142 138  K 3.7 3.9  CL 106 101  CO2 28 28  BUN 14 13  CREATININE 0.68 0.76  GLUCOSE 83 100*   No results found for this basename: LABPT:2,INR:2 in the last 72 hours   Assessment/Plan: 4 Days Post-Op Procedure(s) (LRB): HARDWARE REMOVAL (Left)  Advance diet Up with therapy Continue ABX therapy due to Culture taken of surgical site during incision drainage and debridement with removal of hardware 4 days to get up. Plan to discharge home with home health nursing for followup for dressing change and continued IV antibiotics via PICC line. Should followup with infectious  disease in the next 3-4 weeks and be seen back by myself at [facility] weekly until this incision site is healed. Vistaril seems to be helping in terms of diminishing tendencies H. and decrease excoriation and skin lesions she has been experiencing for the last several years.  Josehua Hammar E 09/29/2012, 8:40 AM

## 2012-09-29 NOTE — Progress Notes (Signed)
Physical Therapy Treatment Patient Details Name: [name] MRN: [id number] DOB: November 09, 1971 Today's Date: 09/29/2012 Time: 4098-1191 PT Time Calculation (min): 27 min  PT Assessment / Plan / Recommendation Comments on Treatment Session  Pt admitted s/p left ankle ORIF and continues to progress with therapy. Pt motivated and able to increase ambulation distance/independence this am as well as tolerate stair negotiation. Ready for safe d/c home once medically cleared by MD.    Follow Up Recommendations  Home health PT     Does the patient have the potential to tolerate intense rehabilitation     Barriers to Discharge        Equipment Recommendations  Rolling walker with 5" wheels    Recommendations for Other Services    Frequency Min 5X/week   Plan Discharge plan remains appropriate;Frequency remains appropriate    Precautions / Restrictions Precautions Precautions: Fall Required Braces or Orthoses: Other Brace/Splint Other Brace/Splint: left LE cam boot Restrictions Weight Bearing Restrictions: Yes LLE Weight Bearing: Partial weight bearing LLE Partial Weight Bearing Percentage or Pounds: 50   Pertinent Vitals/Pain None. Premedicated this am.    Mobility  Bed Mobility Bed Mobility: Supine to Sit Supine to Sit: 6: Modified independent (Device/Increase time) Transfers Transfers: Sit to Stand;Stand to Sit (2 trials.) Sit to Stand: 5: Supervision;With upper extremity assist;From bed;From chair/3-in-1 Stand to Sit: 5: Supervision;With upper extremity assist;To chair/3-in-1 Details for Transfer Assistance: Verbal cues for safest hand placement. Ambulation/Gait Ambulation/Gait Assistance: 5: Supervision Ambulation Distance (Feet): 100 Feet Assistive device: Rolling walker Ambulation/Gait Assistance Details: Verbal cues for tall posture and safe sequence for PWBing (50%) left LE.  Gait Pattern: Step-through pattern;Decreased step length - left;Decreased stance time -  left;Trunk flexed Stairs: Yes Stairs Assistance: 4: Min assist Stairs Assistance Details (indicate cue type and reason): Assist for balance to off weight left LE due to WBing status. Pt able to recall "up with good, down with bad" sequence. Stair Management Technique: One rail Right;Step to pattern;Forwards;Other (comment) (Unilateral HHA left by therapist.) Number of Stairs: 4  Wheelchair Mobility Wheelchair Mobility: No    Exercises     PT Diagnosis:    PT Problem List:   PT Treatment Interventions:     PT Goals Acute Rehab PT Goals PT Goal Formulation: With patient Time For Goal Achievement: 10/03/12 Potential to Achieve Goals: Good PT Goal: Supine/Side to Sit - Progress: Met PT Goal: Sit to Stand - Progress: Progressing toward goal PT Goal: Stand to Sit - Progress: Progressing toward goal PT Goal: Ambulate - Progress: Progressing toward goal PT Goal: Up/Down Stairs - Progress: Progressing toward goal  Visit Information  Last PT Received On: 09/29/12 Assistance Needed: +1    Subjective Data  Subjective: "I get to go home today." Patient Stated Goal: Go home.   Cognition  Overall Cognitive Status: Appears within functional limits for tasks assessed/performed Arousal/Alertness: Awake/alert Orientation Level: Appears intact for tasks assessed Behavior During Session: [facility] for tasks performed    Balance  Balance Balance Assessed: No  End of Session PT - End of Session Equipment Utilized During Treatment: Gait belt;Other (comment) (Left LE CAM walker.) Activity Tolerance: Patient tolerated treatment well Patient left: in chair;with call bell/phone within reach Nurse Communication: Mobility status   GP     Cephus Shelling 09/29/2012, 9:32 AM  09/29/2012 Cephus Shelling, PT, DPT [phone]

## 2012-09-29 NOTE — Discharge Summary (Signed)
Physician Discharge Summary  Patient ID: [name] MRN: [id number] DOB/AGE: 1971-01-09 41 y.o.  Admit date: 09/25/2012 Discharge date:  09/29/2012  Admission Diagnoses:  Principal Problem:  *Abscess of ankle   Discharge Diagnoses:  Same  Past Medical History  Diagnosis Date  . Depression   . Chronic pain   . Leukocytoclastic vasculitis   . Tobacco abuse   . Lupus   . Lupus   . Arthritis   . Anxiety   . GERD (gastroesophageal reflux disease)     Surgeries: Procedure(s): HARDWARE REMOVAL on 09/25/2012   Consultants:   Dr. Ilsa Iha infectious disease  Discharged Condition: Improved  Hospital Course: Trea [name] is an 41 y.o. female who was admitted 09/25/2012 with a chief complaint of No chief complaint on file. , and found to have a diagnosis of Abscess of ankle.  They were brought to the operating room on 09/25/2012 and underwent the above named procedures.    They were given no perioperative antibiotics, however the patient had been taking sulfa for a period of one to 2 weeks prior to surgical intervention. Anti-infectives     Start     Dose/Rate Route Frequency Ordered Stop   09/29/12 0000   ceFAZolin (ANCEF) 2-3 GM-% SOLR        2 g 100 mL/hr over 30 Minutes Intravenous Every 8 hours 09/29/12 0856 10/23/12 2359   09/29/12 0000   rifampin (RIFADIN) 300 MG capsule        300 mg Oral Every 12 hours 09/29/12 0856 10/23/12 2359   09/28/12 1500   ceFAZolin (ANCEF) IVPB 2 g/50 mL premix        2 g 100 mL/hr over 30 Minutes Intravenous 3 times per day 09/28/12 1437     09/27/12 1100   rifampin (RIFADIN) capsule 300 mg        300 mg Oral Every 12 hours 09/27/12 0953     09/26/12 0800   piperacillin-tazobactam (ZOSYN) IVPB 3.375 g  Status:  Discontinued        3.375 g 12.5 mL/hr over 240 Minutes Intravenous Every 8 hours 09/26/12 0017 09/27/12 0751   09/26/12 0030  piperacillin-tazobactam (ZOSYN) IVPB 3.375 g       3.375 g 100 mL/hr over 30 Minutes Intravenous  Once  09/26/12 0017 09/26/12 0139   09/26/12 0015   piperacillin-tazobactam (ZOSYN) IVPB 3.375 g  Status:  Discontinued        3.375 g 100 mL/hr over 30 Minutes Intravenous 4 times per day 09/26/12 0007 09/26/12 0016   09/26/12 0000   vancomycin (VANCOCIN) IVPB 1000 mg/200 mL premix  Status:  Discontinued        1,000 mg 200 mL/hr over 60 Minutes Intravenous Every 8 hours 09/25/12 2320 09/28/12 1437   09/25/12 2145   polymyxin B 500,000 Units, bacitracin 50,000 Units in sodium chloride irrigation 0.9 % 500 mL irrigation  Status:  Discontinued          As needed 09/25/12 2145 09/25/12 2150        .  They were given sequential compression devices, early ambulation, and chemoprophylaxis for DVT prophylaxis. Infectious disease consult was obtained postoperative day #1 Dr. Ilsa Iha saw the patient. She was placed on IV antibiotics of vancomycin and Zosyn later changed to vancomycin and oral rifampin. Results of cultures intraoperatively demonstrated few colonies of staph aureus so that the patient's IV antibiotics were then changed to Ancef 2 g IV every 8 hours and rifampin 300 mg every  12 hours. On postoperative day #3 the patient was seen by Dr. Zenaida Niece dam evaluated and felt to be stable from an infectious disease standpoint should be maintained on IV antibiotics via PICC line right arm for a period of 4 weeks and with Ancef 2 g IV every 8 hours expected date of discontinuance would be 10/23/2012 patient would also be maintained on rifampin 300 mg by mouth every 6 hours. Patient was started on dressing changes postoperative day #1 the wound showed evidence of granulation with no sign. Drainage throughout the remaining portion of her hospitalization. She was maintained on a 50% weightbearing status seen by physical therapy prior to discharge. Will continue to use a cam walking brace when standing and ambulating 50% weightbearing on the left leg for a period of 6 weeks following her surgery. On postoperative day  #4 the patient was seen she is afebrile vital signs are stable tolerating by mouth narcotic medications for relief of pain receiving oral rifampin and IV Ancef. Home health nursing consult obtained for purposes of IV antibiotic treatment as well as daily dressing changes normal saline wet-to-dry the left lateral leg incision site. Patient was then discharged home to be seen back in the office in followup in the next one week. They benefited maximally from their hospital stay and there were no complications.    Recent vital signs:  Filed Vitals:   09/29/12 0616  BP: 95/50  Pulse: 87  Temp: 97.6 F (36.4 C)  Resp: 16    Recent laboratory studies:  Results for orders placed during the hospital encounter of 09/25/12  COMPREHENSIVE METABOLIC PANEL      Component Value Range   Sodium 136  135 - 145 mEq/L   Potassium 4.7  3.5 - 5.1 mEq/L   Chloride 99  96 - 112 mEq/L   CO2 26  19 - 32 mEq/L   Glucose, Bld 98  70 - 99 mg/dL   BUN 14  6 - 23 mg/dL   Creatinine, Ser 1.61  0.50 - 1.10 mg/dL   Calcium 9.3  8.4 - 09.6 mg/dL   Total Protein 7.9  6.0 - 8.3 g/dL   Albumin 3.7  3.5 - 5.2 g/dL   AST 19  0 - 37 U/L   ALT 23  0 - 35 U/L   Alkaline Phosphatase 94  39 - 117 U/L   Total Bilirubin 0.2 (*) 0.3 - 1.2 mg/dL   GFR calc non Af Amer >90  >90 mL/min   GFR calc Af Amer >90  >90 mL/min  CBC WITH DIFFERENTIAL      Component Value Range   WBC 9.5  4.0 - 10.5 K/uL   RBC 4.76  3.87 - 5.11 MIL/uL   Hemoglobin 13.7  12.0 - 15.0 g/dL   HCT 04.5  40.9 - 81.1 %   MCV 85.7  78.0 - 100.0 fL   MCH 28.8  26.0 - 34.0 pg   MCHC 33.6  30.0 - 36.0 g/dL   RDW 91.4  78.2 - 95.6 %   Platelets 291  150 - 400 K/uL   Neutrophils Relative 63  43 - 77 %   Neutro Abs 6.0  1.7 - 7.7 K/uL   Lymphocytes Relative 30  12 - 46 %   Lymphs Abs 2.9  0.7 - 4.0 K/uL   Monocytes Relative 5  3 - 12 %   Monocytes Absolute 0.5  0.1 - 1.0 K/uL   Eosinophils Relative 1  0 - 5 %  Eosinophils Absolute 0.1  0.0 - 0.7 K/uL    Basophils Relative 0  0 - 1 %   Basophils Absolute 0.0  0.0 - 0.1 K/uL  URINE CULTURE      Component Value Range   Specimen Description URINE, CLEAN CATCH     Special Requests NONE     Culture  Setup Time 09/25/2012 15:53     Colony Count 35,000 COLONIES/ML     Culture ESCHERICHIA COLI     Report Status 09/27/2012 FINAL     Organism ID, Bacteria ESCHERICHIA COLI    SEDIMENTATION RATE      Component Value Range   Sed Rate 19  0 - 22 mm/hr  HEMOGLOBIN A1C      Component Value Range   Hemoglobin A1C 5.5  <5.7 %   Mean Plasma Glucose 111  <117 mg/dL  SURGICAL PCR SCREEN      Component Value Range   MRSA, PCR NEGATIVE  NEGATIVE   Staphylococcus aureus NEGATIVE  NEGATIVE  CULTURE, ROUTINE-ABSCESS      Component Value Range   Specimen Description ABSCESS LEFT FOOT     Special Requests PATIENT ON FOLLOWING SULFA     Gram Stain       Value: MODERATE WBC PRESENT,BOTH PMN AND MONONUCLEAR     NO ORGANISMS SEEN     Performed at [facility]   Culture       Value: FEW STAPHYLOCOCCUS AUREUS     Note: RIFAMPIN AND GENTAMICIN SHOULD NOT BE USED AS SINGLE DRUGS FOR TREATMENT OF STAPH INFECTIONS.   Report Status 09/28/2012 FINAL     Organism ID, Bacteria STAPHYLOCOCCUS AUREUS    ANAEROBIC CULTURE      Component Value Range   Specimen Description ABSCESS LEFT FOOT     Special Requests PATIENT ON FOLLOWING SULFA     Gram Stain       Value: MODERATE WBC PRESENT,BOTH PMN AND MONONUCLEAR     NO ORGANISMS SEEN     Performed at [facility]   Culture       Value: NO ANAEROBES ISOLATED; CULTURE IN PROGRESS FOR 5 DAYS   Report Status PENDING    GRAM STAIN      Component Value Range   Specimen Description ABSCESS LEFT FOOT     Special Requests PATIENT ON FOLLOWING SULFA     Gram Stain       Value: MODERATE WBC PRESENT,BOTH PMN AND MONONUCLEAR     NO ORGANISMS SEEN   Report Status 09/25/2012 FINAL    FUNGUS CULTURE W SMEAR      Component Value Range   Specimen Description  TISSUE LEFT FOOT     Special Requests PATIENT ON FOLLOWING SULFA     Fungal Smear NO YEAST OR FUNGAL ELEMENTS SEEN     Culture CULTURE IN PROGRESS FOR FOUR WEEKS     Report Status PENDING    COMPREHENSIVE METABOLIC PANEL      Component Value Range   Sodium 136  135 - 145 mEq/L   Potassium 3.7  3.5 - 5.1 mEq/L   Chloride 101  96 - 112 mEq/L   CO2 27  19 - 32 mEq/L   Glucose, Bld 103 (*) 70 - 99 mg/dL   BUN 12  6 - 23 mg/dL   Creatinine, Ser 1.61  0.50 - 1.10 mg/dL   Calcium 8.9  8.4 - 09.6 mg/dL   Total Protein 6.5  6.0 - 8.3 g/dL   Albumin 2.9 (*)  3.5 - 5.2 g/dL   AST 14  0 - 37 U/L   ALT 18  0 - 35 U/L   Alkaline Phosphatase 83  39 - 117 U/L   Total Bilirubin 0.3  0.3 - 1.2 mg/dL   GFR calc non Af Amer 86 (*) >90 mL/min   GFR calc Af Amer >90  >90 mL/min  C-REACTIVE PROTEIN      Component Value Range   CRP 0.9 (*) <0.60 mg/dL  COMPREHENSIVE METABOLIC PANEL      Component Value Range   Sodium 138  135 - 145 mEq/L   Potassium 3.9  3.5 - 5.1 mEq/L   Chloride 101  96 - 112 mEq/L   CO2 28  19 - 32 mEq/L   Glucose, Bld 100 (*) 70 - 99 mg/dL   BUN 13  6 - 23 mg/dL   Creatinine, Ser 2.13  0.50 - 1.10 mg/dL   Calcium 8.9  8.4 - 08.6 mg/dL   Total Protein 6.3  6.0 - 8.3 g/dL   Albumin 2.8 (*) 3.5 - 5.2 g/dL   AST 12  0 - 37 U/L   ALT 14  0 - 35 U/L   Alkaline Phosphatase 82  39 - 117 U/L   Total Bilirubin 0.1 (*) 0.3 - 1.2 mg/dL   GFR calc non Af Amer >90  >90 mL/min   GFR calc Af Amer >90  >90 mL/min  SEDIMENTATION RATE      Component Value Range   Sed Rate 37 (*) 0 - 22 mm/hr  COMPREHENSIVE METABOLIC PANEL      Component Value Range   Sodium 142  135 - 145 mEq/L   Potassium 3.7  3.5 - 5.1 mEq/L   Chloride 106  96 - 112 mEq/L   CO2 28  19 - 32 mEq/L   Glucose, Bld 83  70 - 99 mg/dL   BUN 14  6 - 23 mg/dL   Creatinine, Ser 5.78  0.50 - 1.10 mg/dL   Calcium 9.3  8.4 - 46.9 mg/dL   Total Protein 6.4  6.0 - 8.3 g/dL   Albumin 2.9 (*) 3.5 - 5.2 g/dL   AST 11  0 - 37 U/L     ALT 13  0 - 35 U/L   Alkaline Phosphatase 80  39 - 117 U/L   Total Bilirubin 0.4  0.3 - 1.2 mg/dL   GFR calc non Af Amer >90  >90 mL/min   GFR calc Af Amer >90  >90 mL/min    Discharge Medications:     Medication List     As of 09/29/2012  8:58 AM    STOP taking these medications         diphenhydrAMINE 25 mg capsule   Commonly known as: BENADRYL      HYDROcodone-acetaminophen 5-325 MG per tablet   Commonly known as: NORCO/VICODIN      ibuprofen 200 MG tablet   Commonly known as: ADVIL,MOTRIN      TAKE these medications         ceFAZolin 2-3 GM-% Solr   Commonly known as: ANCEF   Inject 50 mLs (2 g total) into the vein every 8 (eight) hours.      clonazePAM 1 MG tablet   Commonly known as: KLONOPIN   Take 1 tablet (1 mg total) by mouth 2 (two) times daily as needed for anxiety.      diphenhydramine-acetaminophen 25-500 MG Tabs   Commonly known as: TYLENOL PM  Take 1 tablet by mouth at bedtime as needed. For pain      hydrOXYzine 25 MG tablet   Commonly known as: ATARAX/VISTARIL   Take 1 tablet (25 mg total) by mouth 4 (four) times daily.      oxyCODONE 5 MG immediate release tablet   Commonly known as: Oxy IR/ROXICODONE   Take 1-2 tablets (5-10 mg total) by mouth every 3 (three) hours as needed for pain.      predniSONE 10 MG tablet   Commonly known as: DELTASONE   Take 1 tablet (10 mg total) by mouth daily.      rifampin 300 MG capsule   Commonly known as: RIFADIN   Take 1 capsule (300 mg total) by mouth every 12 (twelve) hours.        Diagnostic Studies: Dg Ankle Complete Left  24-Sep-2012  *RADIOLOGY REPORT*  Clinical Data: Left lateral ankle wound  LEFT ANKLE COMPLETE - 3+ VIEW  Comparison: 09/13/2012  Findings: Soft tissue defect along the lateral malleolar region compatible with ankle wound.  No underlying osseous abnormality or hardware abnormality.  The patient has had a prior bimalleolar fixation.  Stable chronic degenerative changes likely  post- traumatic.  No acute fracture evident.  IMPRESSION: Lateral malleolar soft tissue wound.  No underlying acute osseous finding or hardware abnormality  Post-traumatic degenerative changes   Original Report Authenticated By: Judie Petit. Ruel Favors, M.D.    Dg Ankle Complete Left  09/13/2012  *RADIOLOGY REPORT*  Clinical Data: Swelling  LEFT ANKLE COMPLETE - 3+ VIEW  Comparison: 07/15/2012  Findings: Previous plate and screw fixation of the distal fibula and screw fixation of the medial malleolus as before.  Ankle mortise appears intact.  There are small marginal spurs from the distal tibial articular surface and talar dome.  Normal alignment and mineralization.  No fracture, dislocation, or other acute bony abnormality.  IMPRESSION:  1.  Stable postoperative and degenerative changes.  No acute abnormality.   Original Report Authenticated By: Osa Craver, M.D.     Disposition: 01-Home or Self Care      Discharge Orders    Future Appointments: Provider: Department: Dept Phone: Center:   09/30/2012 9:00 AM Cliffton Asters, MD [facility] for Infectious Disease [phone] [facility]     Future Orders Please Complete By Expires   Diet - low sodium heart healthy      Call MD / Call 911      Comments:   If you experience chest pain or shortness of breath, CALL 911 and be transported to the hospital emergency room.  If you develope a fever above 101 F, pus (white drainage) or increased drainage or redness at the wound, or calf pain, call your surgeon's office.   Constipation Prevention      Comments:   Drink plenty of fluids.  Prune juice may be helpful.  You may use a stool softener, such as Colace (over the counter) 100 mg twice a day.  Use MiraLax (over the counter) for constipation as needed.   Increase activity slowly as tolerated      Discharge instructions      Comments:   Keep short leg splint and dressing dry. May use water impervious bag or cast bag and tub chair to  shower Tape the top of bag to skin to avoid moisture soaking the dressing on the leg. Call if there is odor or saturation of dressing or worsening pain not controlled with medications. Call if fever greater  than 101.5. Use crutches or walker 50% weight bearing on the ankle fracture leg. Please follow up with an appointment with Dr. Otelia Sergeant  1 weeks from the time of surgery. Elevate as often as possible during the first week after surgery gradually increasing the time the leg is dependent or down there after. If swelling recurrs then elevate again. Wheel chair for longer distances. Take aspirin 325mg  daily to prevent blood clots   Driving restrictions      Comments:   No driving for 1 weeks   Lifting restrictions      Comments:   No lifting for 6 weeks      Follow-up Information    Follow up with NITKA,JAMES E, MD. In 1 week.   Contact information:   [address] [place] [address] [phone]       Follow up with [facility]. [facility] RN)    Contact information:   [phone]      Follow up with Judyann Munson, MD. Schedule an appointment as soon as possible for a visit in 4 weeks.   Contact information:   [address] [place] [address] [phone]           Signed: Kerrin Champagne 09/29/2012, 8:58 AM

## 2012-09-29 NOTE — Progress Notes (Signed)
Patient discharged in stable condition via wheelchair. Discharge instructions and prescriptions were given and explained to patient.

## 2012-09-29 NOTE — Progress Notes (Signed)
CARE MANAGEMENT NOTE 09/29/2012  Patient:  NOVALEI, PAOLETTI   Account Number:  [id number]  Date Initiated:  09/26/2012  Documentation initiated by:  Vance Peper  Subjective/Objective Assessment:   41 yr old female s/p I & D and hardware removal Left leg.     Action/Plan:   HHRN for IV antibiotics.   Anticipated DC Date:     Anticipated DC Plan:  HOME W HOME HEALTH SERVICES      DC Planning Services  CM consult      [facility] Choice  HOME HEALTH   Choice offered to / List presented to:     DME arranged  WALKER - Lavone Nian      DME agency  [facility]     HH arranged  HH-1 RN  HH-2 PT      [facility] agency  [facility]   Status of service:  Completed, signed off Medicare Important Message given?   (If response is "NO", the following Medicare IM given date fields will be blank) Date Medicare IM given:   Date Additional Medicare IM given:    Discharge Disposition:  HOME W HOME HEALTH SERVICES  Per UR Regulation:    If discussed at Long Length of Stay Meetings, dates discussed:    Comments:

## 2012-09-30 ENCOUNTER — Encounter: Payer: Self-pay | Admitting: Internal Medicine

## 2012-09-30 ENCOUNTER — Ambulatory Visit (INDEPENDENT_AMBULATORY_CARE_PROVIDER_SITE_OTHER): Payer: Self-pay | Admitting: Internal Medicine

## 2012-09-30 VITALS — BP 139/83 | HR 112 | Temp 97.5°F | Ht 63.0 in | Wt 208.5 lb

## 2012-09-30 DIAGNOSIS — L02419 Cutaneous abscess of limb, unspecified: Secondary | ICD-10-CM

## 2012-09-30 LAB — ANAEROBIC CULTURE

## 2012-09-30 NOTE — Progress Notes (Signed)
Patient ID: [name], female   DOB: May 06, 1971, 41 y.o.   MRN: [id number]    [facility] for Infectious Disease  Patient Active Problem List  Diagnosis  . Leukocytoclastic vasculitis  . Acute skin eruption of discoloration, elevations, blisters  . Anxiety and depression  . Polysubstance abuse  . Skin abscess  . Abscess of ankle    Patient's Medications  New Prescriptions   No medications on file  Previous Medications   CEFAZOLIN (ANCEF) 2-3 GM-% SOLR    Inject 50 mLs (2 g total) into the vein every 8 (eight) hours.   CLONAZEPAM (KLONOPIN) 1 MG TABLET    Take 1 tablet (1 mg total) by mouth 2 (two) times daily as needed for anxiety.   DIPHENHYDRAMINE-ACETAMINOPHEN (TYLENOL PM) 25-500 MG TABS    Take 1 tablet by mouth at bedtime as needed. For pain   HYDROXYZINE (ATARAX/VISTARIL) 25 MG TABLET    Take 1 tablet (25 mg total) by mouth 4 (four) times daily.   OXYCODONE (OXY IR/ROXICODONE) 5 MG IMMEDIATE RELEASE TABLET    Take 1-2 tablets (5-10 mg total) by mouth every 3 (three) hours as needed for pain.   PREDNISONE (DELTASONE) 10 MG TABLET    Take 1 tablet (10 mg total) by mouth daily.   RIFAMPIN (RIFADIN) 300 MG CAPSULE    Take 1 capsule (300 mg total) by mouth every 12 (twelve) hours.  Modified Medications   No medications on file  Discontinued Medications   No medications on file    Subjective: [name] is in for her hospital followup visit. She has been struggling with leukocytoclastic vasculitis for the past 4 years. She has been steroid dependent in most recently on 10 mg of prednisone daily. She is developed recurrent lower extremity cellulitis and has developed an MSSA abscess of her left ankle. She had an ankle fracture 16 years ago and underwent open reduction and internal fixation. She was hospitalized recently and underwent incision and drainage and removal of the plate over her left lateral malleolus. She still has at least 2 screws medially but it does not appear that  the infection involved hardware. My partner, Dr. Daiva Eves, recommended a minimum of 4 weeks of IV cefazolin and oral rifampin. She was discharged yesterday and [facility]. She has had no problems tolerating her antibiotics were her PICC.  Objective: Temp: 97.5 F (36.4 C) (11/12 0846) Temp src: Oral (11/12 0846) BP: 139/83 mmHg (11/12 0846) Pulse Rate: 112  (11/12 0846)  General: She is very anxious and tearful during the exam Skin: Scattered, punctate red lesions, some with excoriations Left ankle: There is a surgical incision over her left lateral ankle with several retention sutures. The midportion of the wound is opened with dusky red granulation tissue. There is some serosanguineous drainage on her dressing. There is no odor. There is no exposed bone.  Lab Results  Component Value Date   CRP 0.9* 09/26/2012    Lab Results  Component Value Date   ESRSEDRATE 37* 09/27/2012      Assessment: She has a smoldering MSSA infection of her left lateral ankle that tracked down to bone. I am hopeful that this will be curable with the lateral hardware out.  Plan: 1. Continue IV cefazolin and oral rifampin through December 5 (she needs financial assistance obtaining the rifampin) 2. Followup in 2-3 weeks   Cliffton Asters, MD [facility] for Infectious Disease [facility] [phone] pager  [phone] cell 09/30/2012, 9:03 AM

## 2012-10-06 ENCOUNTER — Telehealth: Payer: Self-pay | Admitting: *Deleted

## 2012-10-06 NOTE — Telephone Encounter (Signed)
Pt called about refill on Klonopin - filled 09/22/12 # 60. Talked with Dr Allena Katz - saw x 1 due to hospital admission. PCP was [facility]. Has [facility] Special through the clinic. Talked with Tyler Aas - will talk with Dr Rogelia Boga 10/07/12 - pt aware. Stanton Kidney Shadiamond Koska RN 10/06/12 4:10PM

## 2012-10-08 ENCOUNTER — Telehealth: Payer: Self-pay | Admitting: *Deleted

## 2012-10-08 NOTE — Telephone Encounter (Signed)
Patients [facility] nurse called to advise that she was not able to do a blood draw on the patient today. She advised she tired everything and was just not able to do it. She also advised she always is a very hard stick. She wanted Dr Drue Second to know just in case she wanted to call the patient in to have the labs done. Advised will check with the provider and call the patient if she does.

## 2012-10-20 ENCOUNTER — Inpatient Hospital Stay: Payer: Self-pay | Admitting: Internal Medicine

## 2012-10-21 ENCOUNTER — Encounter: Payer: Self-pay | Admitting: Internal Medicine

## 2012-10-21 ENCOUNTER — Ambulatory Visit (INDEPENDENT_AMBULATORY_CARE_PROVIDER_SITE_OTHER): Payer: No Typology Code available for payment source | Admitting: Internal Medicine

## 2012-10-21 VITALS — BP 125/84 | HR 102 | Temp 97.5°F | Ht 63.0 in | Wt 214.2 lb

## 2012-10-21 DIAGNOSIS — L02419 Cutaneous abscess of limb, unspecified: Secondary | ICD-10-CM

## 2012-10-21 LAB — SEDIMENTATION RATE: Sed Rate: 18 mm/hr (ref 0–22)

## 2012-10-21 MED ORDER — CEPHALEXIN 500 MG PO CAPS: 500.0000 mg | ORAL_CAPSULE | Freq: Four times a day (QID) | ORAL | Status: DC

## 2012-10-21 NOTE — Progress Notes (Signed)
Patient ID: [name], female   DOB: September 13, 1971, 41 y.o.   MRN: [id number]    [facility] for Infectious Disease  Patient Active Problem List  Diagnosis  . Leukocytoclastic vasculitis  . Acute skin eruption of discoloration, elevations, blisters  . Anxiety and depression  . Polysubstance abuse  . Skin abscess  . Abscess of ankle    Patient's Medications  New Prescriptions   CEPHALEXIN (KEFLEX) 500 MG CAPSULE    Take 1 capsule (500 mg total) by mouth 4 (four) times daily.  Previous Medications   CLONAZEPAM (KLONOPIN) 1 MG TABLET    Take 1 tablet (1 mg total) by mouth 2 (two) times daily as needed for anxiety.   DIPHENHYDRAMINE-ACETAMINOPHEN (TYLENOL PM) 25-500 MG TABS    Take 1 tablet by mouth at bedtime as needed. For pain   HYDROXYZINE (ATARAX/VISTARIL) 25 MG TABLET    Take 1 tablet (25 mg total) by mouth 4 (four) times daily.   OXYCODONE (OXY IR/ROXICODONE) 5 MG IMMEDIATE RELEASE TABLET    Take 1-2 tablets (5-10 mg total) by mouth every 3 (three) hours as needed for pain.   PREDNISONE (DELTASONE) 10 MG TABLET    Take 1 tablet (10 mg total) by mouth daily.  Modified Medications   No medications on file  Discontinued Medications   CEFAZOLIN (ANCEF) 2-3 GM-% SOLR    Inject 50 mLs (2 g total) into the vein every 8 (eight) hours.   RIFAMPIN (RIFADIN) 300 MG CAPSULE    Take 1 capsule (300 mg total) by mouth every 12 (twelve) hours.    Subjective: [name] is in for her routine followup visit. She has nearing the end of 4 weeks of IV cefazolin therapy for MSSA infection of her left lateral ankle. She's had no problems tolerating the PICC or cefazolin. She feels like her ankle is doing much better. Her sutures were removed last week. She has been able to afford rifampin.  Objective: Temp: 97.5 F (36.4 C) (12/03 0920) Temp src: Oral (12/03 0920) BP: 125/84 mmHg (12/03 0920) Pulse Rate: 102  (12/03 0920)  General: She is in better spirits Skin: Her chronic skin rash is  unchanged. Her right arm PICC site appears normal Her left ankle looks much better. The incision on the lateral ankle is healing well. There is still some open granulation tissue in the center of the wound. There is a small amount of serous sanguinous drainage on her gauze dressing. The sutures have been removed.  Lab Results  Component Value Date   CRP 0.9* 09/26/2012    Lab Results  Component Value Date   ESRSEDRATE 37* 09/27/2012     Assessment: She is improving on therapy for her MSSA ankle infection. I will repeat her inflammatory markers, remove her PICC line and change her to oral cephalexin.  Plan: 1. Check repeat sedimentation rate and C-reactive protein 2. Discontinue PICC and cefazolin 3. Start cephalexin 500 mg 4 times a day 4. Followup in 4 weeks   Cliffton Asters, MD [facility] for Infectious Disease [facility] [phone] pager   [phone] cell 10/21/2012, 9:35 AM

## 2012-10-21 NOTE — Progress Notes (Signed)
RN received verbal order to discontinue the patient's PICC line.  Patient identified with name and date of birth. PICC dressing removed, site unremarkable.    PICC line removed using sterile procedure @ 1000. PICC length equal to that noted in patient's hospital chart of 43 cm. Sterile petroleum gauze + sterile 4X4 applied to PICC site, pressure applied for 10 minutes and covered with Medipore tape as a pressure dressing. Patient tolerated procedure without complaints.  Patient instructed to limit use of arm for 1 hour. Patient instructed that the pressure dressing should remain in place for 24 hours. Patient verbalized understanding of these instructions.

## 2012-10-23 LAB — FUNGUS CULTURE W SMEAR: Fungal Smear: NONE SEEN

## 2012-10-24 ENCOUNTER — Encounter: Payer: Self-pay | Admitting: Internal Medicine

## 2012-11-24 ENCOUNTER — Ambulatory Visit (INDEPENDENT_AMBULATORY_CARE_PROVIDER_SITE_OTHER): Payer: No Typology Code available for payment source | Admitting: Internal Medicine

## 2012-11-24 ENCOUNTER — Encounter: Payer: Self-pay | Admitting: Internal Medicine

## 2012-11-24 VITALS — BP 122/86 | HR 118 | Temp 97.8°F | Ht 63.0 in | Wt 212.5 lb

## 2012-11-24 DIAGNOSIS — F329 Major depressive disorder, single episode, unspecified: Secondary | ICD-10-CM

## 2012-11-24 DIAGNOSIS — L03119 Cellulitis of unspecified part of limb: Secondary | ICD-10-CM

## 2012-11-24 DIAGNOSIS — L02419 Cutaneous abscess of limb, unspecified: Secondary | ICD-10-CM

## 2012-11-24 MED ORDER — OXYCODONE HCL 5 MG PO TABS: 5.0000 mg | ORAL_TABLET | ORAL | Status: DC | PRN

## 2012-11-24 MED ORDER — CEPHALEXIN 500 MG PO CAPS: 500.0000 mg | ORAL_CAPSULE | Freq: Four times a day (QID) | ORAL | Status: DC

## 2012-11-24 MED ORDER — CLONAZEPAM 1 MG PO TABS: 1.0000 mg | ORAL_TABLET | Freq: Two times a day (BID) | ORAL | Status: DC | PRN

## 2012-11-24 NOTE — Progress Notes (Addendum)
Patient ID: [name], female   DOB: [birth year], 42 y.o.   MRN: [id number]    [facility]  Patient Active Problem List  Diagnosis  . Leukocytoclastic vasculitis  . Acute skin eruption of discoloration, elevations, blisters  . Anxiety and depression  . Polysubstance abuse  . Skin abscess  . Abscess of ankle    Patient's Medications  New Prescriptions   No medications on file  Previous Medications   AMITRIPTYLINE (ELAVIL) 25 MG TABLET    Take 25 mg by mouth at bedtime.   DIPHENHYDRAMINE-ACETAMINOPHEN (TYLENOL PM) 25-500 MG TABS    Take 1 tablet by mouth at bedtime as needed. For pain   PREDNISONE (DELTASONE) 10 MG TABLET    Take 1 tablet (10 mg total) by mouth daily.  Modified Medications   Modified Medication Previous Medication   CEPHALEXIN (KEFLEX) 500 MG CAPSULE cephALEXin (KEFLEX) 500 MG capsule      Take 1 capsule (500 mg total) by mouth 4 (four) times daily.    Take 1 capsule (500 mg total) by mouth 4 (four) times daily.   CLONAZEPAM (KLONOPIN) 1 MG TABLET clonazePAM (KLONOPIN) 1 MG tablet      Take 1 tablet (1 mg total) by mouth 2 (two) times daily as needed for anxiety.    Take 1 tablet (1 mg total) by mouth 2 (two) times daily as needed for anxiety.   OXYCODONE (OXY IR/ROXICODONE) 5 MG IMMEDIATE RELEASE TABLET oxyCODONE (OXY IR/ROXICODONE) 5 MG immediate release tablet      Take 1-2 tablets (5-10 mg total) by mouth every 3 (three) hours as needed for pain.    Take 1-2 tablets (5-10 mg total) by mouth every 3 (three) hours as needed for pain.  Discontinued Medications   No medications on file    Subjective: Ms. [name] is in for her routine visit. She has completed 2 months of therapy for MSSA left ankle infection. She feels like her ankle is doing much better and she has been taken out of her boot recently by Dr. Otelia Sergeant. She remains terribly frustrated and distressed about her generalized joint pains, stiffness and vasculitic rash. She had postponed  her first visit with the rheumatologist at [facility] last month because she could not afford the visit. He has been rescheduled for January 27. She currently does not have a primary care established and is off of her Klonopin and oxycodone.  Objective: Temp: 97.8 F (36.6 C) (01/06 1505) Temp src: Oral (01/06 1505) BP: 122/86 mmHg (01/06 1505) Pulse Rate: 118  (01/06 1505)  General: She is crying Skin: No change in her chronic rash on her arms and legs with secondary excoriations Lungs: Clear Cor: Regular S1 and S2 no murmurs Left ankle: Her lateral ankle incision is healing nicely. There is no erythema or swelling  Lab Results  Component Value Date   CRP 0.6* 10/21/2012    Lab Results  Component Value Date   ESRSEDRATE 18 10/21/2012      Assessment: Her left ankle infection is improving and her inflammatory markers are coming down. I will continue her cephalexin for now pending repeat lab work  Plan: 1. Continue cephalexin 2. Repeat sedimentation rate and C-reactive protein 3. I have offered to get a short-term refill of her Klonopin and oxycodone   Cliffton Asters, MD [facility] [phone] pager   [phone] cell 11/24/2012, 3:32 PM

## 2012-12-29 ENCOUNTER — Other Ambulatory Visit: Payer: Self-pay | Admitting: Internal Medicine

## 2012-12-29 NOTE — Telephone Encounter (Signed)
Can this patient have a refill for klonopin?

## 2012-12-30 ENCOUNTER — Other Ambulatory Visit: Payer: Self-pay | Admitting: Licensed Clinical Social Worker

## 2012-12-30 DIAGNOSIS — F411 Generalized anxiety disorder: Secondary | ICD-10-CM

## 2012-12-30 MED ORDER — CLONAZEPAM 1 MG PO TABS: ORAL_TABLET | ORAL | Status: DC

## 2012-12-30 NOTE — Telephone Encounter (Signed)
She may have 2 refills on her Klonopin.

## 2013-01-12 ENCOUNTER — Emergency Department (HOSPITAL_COMMUNITY)
Admission: EM | Admit: 2013-01-12 | Discharge: 2013-01-12 | Disposition: A | Discharge status: Home / Self Care | Payer: No Typology Code available for payment source | Attending: Emergency Medicine | Admitting: Emergency Medicine

## 2013-01-12 ENCOUNTER — Encounter (HOSPITAL_COMMUNITY): Payer: Self-pay | Admitting: Emergency Medicine

## 2013-01-12 DIAGNOSIS — Z79899 Other long term (current) drug therapy: Secondary | ICD-10-CM | POA: Insufficient documentation

## 2013-01-12 DIAGNOSIS — G8929 Other chronic pain: Secondary | ICD-10-CM | POA: Insufficient documentation

## 2013-01-12 DIAGNOSIS — Z8679 Personal history of other diseases of the circulatory system: Secondary | ICD-10-CM | POA: Insufficient documentation

## 2013-01-12 DIAGNOSIS — F411 Generalized anxiety disorder: Secondary | ICD-10-CM | POA: Insufficient documentation

## 2013-01-12 DIAGNOSIS — F17200 Nicotine dependence, unspecified, uncomplicated: Secondary | ICD-10-CM | POA: Insufficient documentation

## 2013-01-12 DIAGNOSIS — R000 Tachycardia, unspecified: Secondary | ICD-10-CM | POA: Insufficient documentation

## 2013-01-12 DIAGNOSIS — Z8739 Personal history of other diseases of the musculoskeletal system and connective tissue: Secondary | ICD-10-CM | POA: Insufficient documentation

## 2013-01-12 DIAGNOSIS — F3289 Other specified depressive episodes: Secondary | ICD-10-CM | POA: Insufficient documentation

## 2013-01-12 DIAGNOSIS — Z8719 Personal history of other diseases of the digestive system: Secondary | ICD-10-CM | POA: Insufficient documentation

## 2013-01-12 DIAGNOSIS — L930 Discoid lupus erythematosus: Secondary | ICD-10-CM | POA: Insufficient documentation

## 2013-01-12 DIAGNOSIS — M25529 Pain in unspecified elbow: Secondary | ICD-10-CM | POA: Insufficient documentation

## 2013-01-12 DIAGNOSIS — R21 Rash and other nonspecific skin eruption: Secondary | ICD-10-CM | POA: Insufficient documentation

## 2013-01-12 MED ORDER — PREDNISONE 20 MG PO TABS
60.0000 mg | ORAL_TABLET | Freq: Once | ORAL | Status: AC
  Administered 2013-01-12: 60 mg via ORAL
  Filled 2013-01-12: qty 3

## 2013-01-12 MED ORDER — OXYCODONE-ACETAMINOPHEN 5-325 MG PO TABS
2.0000 | ORAL_TABLET | Freq: Once | ORAL | Status: AC
  Administered 2013-01-12: 2 via ORAL
  Filled 2013-01-12: qty 2

## 2013-01-12 MED ORDER — DIAZEPAM 5 MG PO TABS
5.0000 mg | ORAL_TABLET | Freq: Once | ORAL | Status: AC
  Administered 2013-01-12: 5 mg via ORAL
  Filled 2013-01-12: qty 1

## 2013-01-12 MED ORDER — PREDNISONE 10 MG PO TABS: 20.0000 mg | ORAL_TABLET | Freq: Every day | ORAL | Status: DC

## 2013-01-12 MED ORDER — LORAZEPAM 1 MG PO TABS
2.0000 mg | ORAL_TABLET | Freq: Once | ORAL | Status: AC
  Administered 2013-01-12: 2 mg via ORAL
  Filled 2013-01-12: qty 2

## 2013-01-12 MED ORDER — KETOROLAC TROMETHAMINE 60 MG/2ML IM SOLN
60.0000 mg | Freq: Once | INTRAMUSCULAR | Status: AC
  Administered 2013-01-12: 60 mg via INTRAMUSCULAR
  Filled 2013-01-12: qty 2

## 2013-01-12 MED ORDER — MORPHINE SULFATE 4 MG/ML IJ SOLN
8.0000 mg | Freq: Once | INTRAMUSCULAR | Status: AC
  Administered 2013-01-12: 8 mg via INTRAMUSCULAR
  Filled 2013-01-12: qty 2

## 2013-01-12 NOTE — ED Provider Notes (Signed)
History     CSN: [id number]  Arrival date & time 01/12/13  1056   First MD Initiated Contact with Patient 01/12/13 1205      Chief Complaint  Patient presents with  . Joint Pain    (Consider location/radiation/quality/duration/timing/severity/associated sxs/prior treatment) The history is provided by the patient.   Patient here complaining of pain in her elbows and joints has been chronic for 4 years. Doesn't have insurance and is unable to see a regular physician. Denies any new injuries. No fever or chills. Pain is been constant and worse with walking or movement. Has been referred to chronic pancreatic but due to her finances is unable to go to her. Past Medical History  Diagnosis Date  . Depression   . Chronic pain   . Leukocytoclastic vasculitis   . Tobacco abuse   . Lupus   . Lupus   . Arthritis   . Anxiety   . GERD (gastroesophageal reflux disease)     Past Surgical History  Procedure Laterality Date  . Cholecystectomy    . Tubal ligation    . Ankle surgery    . Hardware removal  09/25/2012    Procedure: HARDWARE REMOVAL;  Surgeon: Kerrin Champagne, MD;  Location: [facility] OR;  Service: Orthopedics;  Laterality: Left;  Incision, drainage and debridement of left lateral ankle, removal of plate and screws    Family History  Problem Relation Age of Onset  . Stroke Mother     History  Substance Use Topics  . Smoking status: Current Every Day Smoker -- 1.00 packs/day    Types: Cigarettes  . Smokeless tobacco: Never Used  . Alcohol Use: No    OB History   Grav Para Term Preterm Abortions TAB SAB Ect Mult Living                  Review of Systems  All other systems reviewed and are negative.    Allergies  Review of patient's allergies indicates no known allergies.  Home Medications   Current Outpatient Rx  Name  Route  Sig  Dispense  Refill  . cephALEXin (KEFLEX) 500 MG capsule   Oral   Take 1 capsule (500 mg total) by mouth 4 (four) times daily.   120  capsule   1   . clonazePAM (KLONOPIN) 1 MG tablet   Oral   Take 1 mg by mouth 2 (two) times daily as needed for anxiety.         . diphenhydramine-acetaminophen (TYLENOL PM) 25-500 MG TABS   Oral   Take 1 tablet by mouth at bedtime as needed. For pain         . predniSONE (DELTASONE) 10 MG tablet   Oral   Take 1 tablet (10 mg total) by mouth daily.   30 tablet   2     BP 130/84  Pulse 117  Temp(Src) 97.6 F (36.4 C)  Resp 16  SpO2 99%  LMP 01/11/2013  Physical Exam  Nursing note and vitals reviewed. Constitutional: She is oriented to person, place, and time. She appears well-developed and well-nourished.  Non-toxic appearance. No distress.  HENT:  Head: Normocephalic and atraumatic.  Eyes: Conjunctivae, EOM and lids are normal. Pupils are equal, round, and reactive to light.  Neck: Normal range of motion. Neck supple. No tracheal deviation present. No mass present.  Cardiovascular: Regular rhythm and normal heart sounds.  Tachycardia present.  Exam reveals no gallop.   No murmur heard. Pulmonary/Chest: Effort normal  and breath sounds normal. No stridor. No respiratory distress. She has no decreased breath sounds. She has no wheezes. She has no rhonchi. She has no rales.  Abdominal: Soft. Normal appearance and bowel sounds are normal. She exhibits no distension. There is no tenderness. There is no rebound and no CVA tenderness.  Musculoskeletal: Normal range of motion. She exhibits no edema and no tenderness.  No joint effusion is noted. Full range of motion at all of her joints.  Neurological: She is alert and oriented to person, place, and time. She has normal strength. No cranial nerve deficit or sensory deficit. GCS eye subscore is 4. GCS verbal subscore is 5. GCS motor subscore is 6.  Skin: Skin is warm and dry. Rash noted. No abrasion noted. Rash is pustular.  Psychiatric: Her mood appears anxious. Her speech is rapid and/or pressured. She is agitated.    ED  Course  Procedures (including critical care time)  Labs Reviewed - No data to display No results found.   No diagnosis found.    MDM  Patient given morphine for pain a long with many other medications. Her pain is controlled this time and she'll be discharged on a course of prednisone        Toy Baker, MD 01/12/13 1410

## 2013-01-12 NOTE — ED Notes (Signed)
Pt reports hx of joint pain and lesions to arms and buttocks for several years. States last night pain became unbearable. States unable to sleep at night due to pain. States took ibuprofen and tylenol PM with no relief. Pt restless, anxious, crying.

## 2013-01-13 ENCOUNTER — Encounter (HOSPITAL_COMMUNITY): Payer: Self-pay

## 2013-01-13 ENCOUNTER — Emergency Department (HOSPITAL_COMMUNITY)
Admission: EM | Admit: 2013-01-13 | Discharge: 2013-01-13 | Disposition: A | Discharge status: Home / Self Care | Payer: No Typology Code available for payment source | Attending: *Deleted | Admitting: *Deleted

## 2013-01-13 DIAGNOSIS — F3289 Other specified depressive episodes: Secondary | ICD-10-CM | POA: Insufficient documentation

## 2013-01-13 DIAGNOSIS — I10 Essential (primary) hypertension: Secondary | ICD-10-CM | POA: Insufficient documentation

## 2013-01-13 DIAGNOSIS — F411 Generalized anxiety disorder: Secondary | ICD-10-CM | POA: Insufficient documentation

## 2013-01-13 DIAGNOSIS — Z8679 Personal history of other diseases of the circulatory system: Secondary | ICD-10-CM | POA: Insufficient documentation

## 2013-01-13 DIAGNOSIS — R000 Tachycardia, unspecified: Secondary | ICD-10-CM | POA: Insufficient documentation

## 2013-01-13 DIAGNOSIS — M2550 Pain in unspecified joint: Secondary | ICD-10-CM | POA: Insufficient documentation

## 2013-01-13 DIAGNOSIS — Z8739 Personal history of other diseases of the musculoskeletal system and connective tissue: Secondary | ICD-10-CM | POA: Insufficient documentation

## 2013-01-13 DIAGNOSIS — G8929 Other chronic pain: Secondary | ICD-10-CM | POA: Insufficient documentation

## 2013-01-13 DIAGNOSIS — Z79899 Other long term (current) drug therapy: Secondary | ICD-10-CM | POA: Insufficient documentation

## 2013-01-13 DIAGNOSIS — F1910 Other psychoactive substance abuse, uncomplicated: Secondary | ICD-10-CM | POA: Insufficient documentation

## 2013-01-13 DIAGNOSIS — Z8719 Personal history of other diseases of the digestive system: Secondary | ICD-10-CM | POA: Insufficient documentation

## 2013-01-13 DIAGNOSIS — F17200 Nicotine dependence, unspecified, uncomplicated: Secondary | ICD-10-CM | POA: Insufficient documentation

## 2013-01-13 LAB — COMPREHENSIVE METABOLIC PANEL
ALT: 14 U/L (ref 0–35)
AST: 28 U/L (ref 0–37)
Albumin: 3.8 g/dL (ref 3.5–5.2)
CO2: 18 mEq/L — ABNORMAL LOW (ref 19–32)
Calcium: 8.3 mg/dL — ABNORMAL LOW (ref 8.4–10.5)
Creatinine, Ser: 0.77 mg/dL (ref 0.50–1.10)
GFR calc non Af Amer: >90 mL/min (ref >90)
Sodium: 135 mEq/L (ref 135–145)

## 2013-01-13 LAB — CBC WITH DIFFERENTIAL/PLATELET
Basophils Absolute: 0 10*3/uL (ref 0.0–0.1)
Basophils Relative: 0 % (ref 0–1)
Eosinophils Relative: 0 % (ref 0–5)
Lymphocytes Relative: 13 % (ref 12–46)
MCHC: 36 g/dL (ref 30.0–36.0)
MCV: 79.2 fL (ref 78.0–100.0)
Monocytes Absolute: 1.1 10*3/uL — ABNORMAL HIGH (ref 0.1–1.0)
Neutro Abs: 14.4 10*3/uL — ABNORMAL HIGH (ref 1.7–7.7)
Platelets: 368 10*3/uL (ref 150–400)
RDW: 13.4 % (ref 11.5–15.5)
WBC: 17.9 10*3/uL — ABNORMAL HIGH (ref 4.0–10.5)

## 2013-01-13 MED ORDER — DIAZEPAM 5 MG PO TABS
5.0000 mg | ORAL_TABLET | Freq: Once | ORAL | Status: AC
  Administered 2013-01-13: 5 mg via ORAL
  Filled 2013-01-13: qty 1

## 2013-01-13 MED ORDER — HYDROMORPHONE HCL PF 2 MG/ML IJ SOLN
2.0000 mg | Freq: Once | INTRAMUSCULAR | Status: AC
  Administered 2013-01-13: 2 mg via INTRAMUSCULAR
  Filled 2013-01-13: qty 1

## 2013-01-13 MED ORDER — ACETAMINOPHEN 325 MG PO TABS: 650.0000 mg | ORAL_TABLET | ORAL | Status: DC | PRN

## 2013-01-13 MED ORDER — NICOTINE 21 MG/24HR TD PT24: 21.0000 mg | MEDICATED_PATCH | Freq: Every day | TRANSDERMAL | Status: DC

## 2013-01-13 MED ORDER — DIPHENHYDRAMINE HCL 25 MG PO CAPS
50.0000 mg | ORAL_CAPSULE | Freq: Once | ORAL | Status: AC
  Administered 2013-01-13: 50 mg via ORAL
  Filled 2013-01-13: qty 2

## 2013-01-13 MED ORDER — IBUPROFEN 200 MG PO TABS
600.0000 mg | ORAL_TABLET | Freq: Three times a day (TID) | ORAL | Status: DC | PRN
  Administered 2013-01-13: 600 mg via ORAL
  Filled 2013-01-13: qty 3

## 2013-01-13 MED ORDER — ONDANSETRON HCL 8 MG PO TABS: 4.0000 mg | ORAL_TABLET | Freq: Three times a day (TID) | ORAL | Status: DC | PRN

## 2013-01-13 NOTE — ED Notes (Signed)
Pt given ginger ale and crackers to eat.

## 2013-01-13 NOTE — ED Provider Notes (Signed)
Pt seen with PA for chronic pain She now reports if she goes home she will kill herself Will move to Pod C and initiate psych workup I informed her that she will not be given chronic pain meds while waiting in the ED BP 143/102  Pulse 121  Temp(Src) 97.2 F (36.2 C) (Oral)  Resp 17  SpO2 94%  LMP 01/11/2013   Joya Gaskins, MD 01/13/13 [phone]

## 2013-01-13 NOTE — ED Notes (Signed)
Pt asleep in bed, resp even and unlabored. 

## 2013-01-13 NOTE — ED Notes (Addendum)
Discharge instructions and follow-up discussed. Pt sts she does not feel comfortable being discharged sts wants to speak to supervisor. Lisette PA & Elliot Gurney RN made aware. Dr. Bebe Shaggy to speak to pt.

## 2013-01-13 NOTE — ED Notes (Signed)
Pt ambulated to restroom, continues to cry in pain. PA made aware of pt's pain, no new orders received.

## 2013-01-13 NOTE — ED Notes (Signed)
Pt wanded by security. 

## 2013-01-13 NOTE — ED Provider Notes (Signed)
History     CSN: [id number]  Arrival date & time 01/13/13  0051   First MD Initiated Contact with Patient 01/13/13 0130      Chief Complaint  Patient presents with  . Joint Pain    (Consider location/radiation/quality/duration/timing/severity/associated sxs/prior treatment) HPI Comments: [name] is a 42 y.o. female with a hx of  Leukocytoclastic vasculitis, Anxiety and depression, Polysubstance abuse & chronic bilateral joint pain x4-5 years that presents to the ER c/o joint pain. Pt was seen yesterday in ER for similar presentation and was given steroids at dc. Pt states that she was dx with lupus in the past, but cannot afford Rheumatologist appointments to confirm diagnoses. Current pain onset was "forever ago" its just getting worse. Pain location is bilateral feet, elbows and fingers. Pt denies any fevers, CP, SOB, injury/trauma, night sweats, chills, weight loss or recent illicit drug use. No other complaints at this time   The history is provided by the patient.    Past Medical History  Diagnosis Date  . Depression   . Chronic pain   . Leukocytoclastic vasculitis   . Tobacco abuse   . Lupus   . Lupus   . Arthritis   . Anxiety   . GERD (gastroesophageal reflux disease)     Past Surgical History  Procedure Laterality Date  . Cholecystectomy    . Tubal ligation    . Ankle surgery    . Hardware removal  09/25/2012    Procedure: HARDWARE REMOVAL;  Surgeon: Kerrin Champagne, MD;  Location: [facility] OR;  Service: Orthopedics;  Laterality: Left;  Incision, drainage and debridement of left lateral ankle, removal of plate and screws    Family History  Problem Relation Age of Onset  . Stroke Mother     History  Substance Use Topics  . Smoking status: Current Every Day Smoker -- 1.00 packs/day    Types: Cigarettes  . Smokeless tobacco: Never Used  . Alcohol Use: No    OB History   Grav Para Term Preterm Abortions TAB SAB Ect Mult Living                  Review of  Systems  All other systems reviewed and are negative.    Allergies  Review of patient's allergies indicates no known allergies.  Home Medications   Current Outpatient Rx  Name  Route  Sig  Dispense  Refill  . cephALEXin (KEFLEX) 500 MG capsule   Oral   Take 1 capsule (500 mg total) by mouth 4 (four) times daily.   120 capsule   1   . clonazePAM (KLONOPIN) 1 MG tablet   Oral   Take 1 mg by mouth 2 (two) times daily as needed for anxiety.         Marland Kitchen ibuprofen (ADVIL,MOTRIN) 200 MG tablet   Oral   Take 200 mg by mouth every 6 (six) hours as needed for pain.           BP 140/91  Pulse 130  Temp(Src) 97.8 F (36.6 C) (Oral)  Resp 20  SpO2 97%  LMP 01/11/2013  Physical Exam  Nursing note and vitals reviewed. Constitutional: She is oriented to person, place, and time. She appears well-developed and well-nourished. She appears distressed.  HENT:  Head: Normocephalic and atraumatic.  Eyes: Conjunctivae and EOM are normal.  Neck: Normal range of motion.  Cardiovascular:  Tachycardic Intact distal pulses  Pulmonary/Chest: Effort normal.  Musculoskeletal: Normal range of motion.  FROM of all joints.   Neurological: She is alert and oriented to person, place, and time.  Skin: Skin is warm and dry. No rash noted. She is not diaphoretic.  No warmth, erythema, or swelling over joints. Red rash on lower extremities, no draining, petechiae, or purpura. Tender nodules on  Toes and feet bilaterally.   Psychiatric: She has a normal mood and affect. Her behavior is normal.  Speech is rapid w agitated mood. Pt appears anxious    ED Course  Procedures (including critical care time)  Labs Reviewed - No data to display No results found.   No diagnosis found.    MDM  Acute on chronic joint pain, tachycardia, hypertensive  Pt w hx of polysubstance abuse, chronic pain  Presents to ER afebrile c/o bilateral joint pain x 4 years stating she is out of pain medications, doesn't  have ins and does not have money to see her doctor. Pt seen earlier today, treated with pain medications & steroids, she was dc with prednisone. Pt denies new or worsening symptoms since earlier.  On exam joints are not warm, erythematosa and do not appear infected. Pain managed in ER. Advised follow up with PCP/ Rheumatologist. HR improved after pain medications given.   Plan for discharge was discussed with pt at which time she states "I cant go home in pain like this, the other doctors typically give me IV pain medications for a couple of hours." Once explained that I did not feel IV pain medication was indicated pt stated "If I go home i will kill my self, I know how to do it and am not afraid to... If you do not give me more pain medications it will be your fault."   Case discussed with attending. Medical clearance labs pending, ACT consulted and pt moved to Pod C pending further suicide risk estimation.  Jaci Carrel, [place] 01/13/13 [phone]

## 2013-01-13 NOTE — ED Notes (Signed)
Pt sts if she is discharged she will go home and commit suicide. Dr. Bebe Shaggy made aware.

## 2013-01-13 NOTE — ED Notes (Signed)
Patient presents via EMS with c/o pain in hands, bilateral feet and bilateral elbows. Also with generalized rash. Both chronic in nature and intermittent in nature "over 5 years." Was seen here earlier today for the same. Patient reports no new pain or sx since being discharged. States she took ibuprofen at home with no relief. Patient received prednisone here and was given a Rx for the same. Patient reports that she has had the Rx filled and will start tomorrow.

## 2013-01-13 NOTE — ED Provider Notes (Signed)
The patient followed by outpatient clinics. Patient had a telemedicine psychiatric consult not suicidal now denies suicidal thoughts. Patient's history of joint pain may be lupus certainly chronic in nature. Patient's had a total of 4 mg of hydromorphone in the emergency department. We'll give one more dose of 2 mg. Discharge patient home she continue her nonsteroidal anti-inflammatories and followup with outpatient clinics.  Shelda Jakes, MD 01/13/13 1140

## 2013-01-13 NOTE — ED Notes (Signed)
Pt c/o itching all over. PA made aware and order for benadryl received.

## 2013-01-13 NOTE — ED Provider Notes (Signed)
Medical screening examination/treatment/procedure(s) were conducted as a shared visit with non-physician practitioner(s) and myself.  I personally evaluated the patient during the encounter  Pt to seen ACT And telepsych Pt stable thus far while awaiting psych eval  Joya Gaskins, MD 01/13/13 [phone]

## 2013-01-13 NOTE — ED Notes (Signed)
Pt still complaining of pain- MD Wickline made aware and no new orders received.

## 2013-01-13 NOTE — ED Notes (Signed)
Pt received from FT. Pt very tearful on assessment, states "I don't want to kill myself, but I'm just in so much pain". Pt states has had chronic pain "flare ups" for past five years, but is unable to afford to go see a specialist.

## 2013-01-14 ENCOUNTER — Encounter: Payer: Self-pay | Admitting: Licensed Clinical Social Worker

## 2013-01-14 ENCOUNTER — Encounter: Payer: Self-pay | Admitting: Internal Medicine

## 2013-01-14 ENCOUNTER — Ambulatory Visit (INDEPENDENT_AMBULATORY_CARE_PROVIDER_SITE_OTHER): Payer: No Typology Code available for payment source | Admitting: Internal Medicine

## 2013-01-14 VITALS — BP 140/87 | HR 112 | Temp 98.6°F | Ht 63.0 in | Wt 198.6 lb

## 2013-01-14 DIAGNOSIS — F341 Dysthymic disorder: Secondary | ICD-10-CM

## 2013-01-14 DIAGNOSIS — L02419 Cutaneous abscess of limb, unspecified: Secondary | ICD-10-CM

## 2013-01-14 DIAGNOSIS — F1910 Other psychoactive substance abuse, uncomplicated: Secondary | ICD-10-CM

## 2013-01-14 DIAGNOSIS — M310 Hypersensitivity angiitis: Secondary | ICD-10-CM

## 2013-01-14 DIAGNOSIS — F411 Generalized anxiety disorder: Secondary | ICD-10-CM

## 2013-01-14 DIAGNOSIS — F329 Major depressive disorder, single episode, unspecified: Secondary | ICD-10-CM

## 2013-01-14 LAB — CBC WITH DIFFERENTIAL/PLATELET
Basophils Relative: 1 % (ref 0–1)
Eosinophils Absolute: 0.1 10*3/uL (ref 0.0–0.7)
Eosinophils Relative: 1 % (ref 0–5)
Lymphs Abs: 2.8 10*3/uL (ref 0.7–4.0)
MCH: 26.7 pg (ref 26.0–34.0)
MCHC: 33.4 g/dL (ref 30.0–36.0)
MCV: 80 fL (ref 78.0–100.0)
Neutrophils Relative %: 58 % (ref 43–77)
Platelets: 375 10*3/uL (ref 150–400)
RBC: 5.09 MIL/uL (ref 3.87–5.11)
RDW: 14.8 % (ref 11.5–15.5)

## 2013-01-14 LAB — SEDIMENTATION RATE: Sed Rate: 18 mm/hr (ref 0–22)

## 2013-01-14 LAB — C3 AND C4
C3 Complement: 129 mg/dL (ref 90–180)
C4 Complement: 16 mg/dL (ref 10–40)

## 2013-01-14 MED ORDER — KETOROLAC TROMETHAMINE 30 MG/ML IJ SOLN
30.0000 mg | Freq: Once | INTRAMUSCULAR | Status: AC
  Administered 2013-01-14: 30 mg via INTRAMUSCULAR

## 2013-01-14 MED ORDER — PREDNISONE 20 MG PO TABS: 20.0000 mg | ORAL_TABLET | Freq: Two times a day (BID) | ORAL | Status: DC

## 2013-01-14 MED ORDER — TRAMADOL HCL 50 MG PO TABS: 50.0000 mg | ORAL_TABLET | Freq: Four times a day (QID) | ORAL | Status: DC | PRN

## 2013-01-14 MED ORDER — OMEPRAZOLE 40 MG PO CPDR: 40.0000 mg | DELAYED_RELEASE_CAPSULE | Freq: Every day | ORAL | Status: DC

## 2013-01-14 NOTE — Assessment & Plan Note (Signed)
She reports being clean for almost one year now.  No narcotics prescribed today.

## 2013-01-14 NOTE — Assessment & Plan Note (Signed)
Pt referred to Lynnae January for outpatient [facility] appointment (3/5), and transportation needs. Pt met with Lynnae January today.

## 2013-01-14 NOTE — Patient Instructions (Addendum)
General Instructions: -You need to take Prednisone for your skin inflammation until you see [place] again in clinic -Take omeprazole everyday to protect your stomach from heartburn and ulcer while you are on prednisone -Follow up with Behavioral Health -Follow up with the infectious Disease appointment -Take all your medications as prescribed.  -You need to quit smoking, use the tips for quitting or call 1-800-QUIT-NOW for more information -It was great meeting you!  Treatment Goals:  Quit smoking   Progress Toward Treatment Goals:  Treatment Goal 01/14/2013  Stop smoking smoking the same amount  Other  deteriorated    Self Care Goals & Plans:  Self Care Goal 01/14/2013  Manage my medications take my medicines as prescribed; refill my medications on time  Eat healthy foods eat baked foods instead of fried foods; eat foods that are low in salt; eat more vegetables  Stop smoking go to the [employer] ([employer]); call [employer] (1-800-QUIT-NOW)       Care Management & Community Referrals:  Referral 01/14/2013  Referrals made for care management support social worker  Referrals made to community resources transportation

## 2013-01-14 NOTE — Assessment & Plan Note (Addendum)
No abscess of the ankle on physical exam today. She has follow up appointment with ID next week.

## 2013-01-14 NOTE — Progress Notes (Signed)
[name] was referred to CSW for transportation and psychiatry referrals.  Pt is current with her [facility] card and has legal representation for her disability appeal.  CSW provided [name] with [employer] transportation form and brochure.  Pt completed form and has been faxed in, pt aware she can now schedule medical appt to [facility] with no charge.  CSW provided pt with one bus pass to home for today.   CSW was able to schedule [name] with [facility], Dr. Lolly Mustache on [name].  Letter provided to pt with information.  Pt's spouse able to provide transportation to appt.  Pt voiced concern regarding riding bus as walking to/from nearest stop is often difficulty.  CSW discussed [employer] application. Pt in agreement.  CSW provided [name] with Part A of [employer] application and CSW will initiate Part B.  Pt provided with CSW contact information and hours.  CSW will mail completed Part B to [name]. Pt denies add'l needs at this time and is aware CSW is available to assist as needed.

## 2013-01-14 NOTE — Assessment & Plan Note (Addendum)
She has an appointment with ID for 3/6. She has been unable to follow up at [facility] secondary to lack of funds ($40 co-pay) and lack of transportation.  She reports relief of her pain in the past with Prednisone but has not had recent long term treatment.  -Toradol 30mg  IM at clinic for pain control Plan: -CBC, Sed rate, CRP, C3 C4, total complement (CH50), ANA, urinalysis -Continue Keflex -Prednisone 40mg  daily until she is reassessed in the [facility]--pt advised to continue taking Klonopin as this medication may worsen her anxiety -Omeprazole for GI prophylaxis while on prednisone -Tramadol PRN for pain  -Will refer to [facility]--pt may be able to have a ride with a family member for this appointment.  -Follow up at the [facility] next week, would check serum cryoglobulins, UPEP and SPEP

## 2013-01-14 NOTE — Progress Notes (Signed)
Subjective:    Patient ID: [name], female    DOB: 05/05/1971, 42 y.o.   MRN: [id number]  HPI [name] is a 42 year old woman with PMH of polysubstance abuse (crack cocaine), leukocytoclastic vasculitis, and anxiety who comes in to the [facility] as ED follow up visit. She was seen that the [facility] ED on 2/24 and 2/25 for her bilateral foot pain secondary to "bumps" on the soles of her feet. She states that she has had skin eruptions for almost 4 years now. She has been followed at the ID clinic, seen by Dr. Orvan Falconer. She has undergone hardware removal surgery recently for her left lateral malleolus by Dr. 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 She continues to take Keflex daily.  In the past the bumps have subsided with prednisone treatment but she has not had long term prednisone treatment for months now. She states she received one dose of prednisone while in the ED two days ago but has not filled her prescription for prednisone.   She denies changes to her symptoms but states that she could no longer "sit at home in pain" and decided to go to the ED for the past two days. It is difficult for her to walk due to the sores on the soles of her feet, she describes excruciating pain while walking.   Of note, she stated intent to harm herself last night but explains that she was frustrated with her care and did not want to be discharged home. She denies SI/HI today. She does not have a psychiatrist and has not been seen by outpatient Behavioral Health. She is a former Social worker patient and has been on Klonopin for her anxiety for a long time now, perhaps for years. Klonopin was recently re prescribed by her ID physician and her anxiety had worsened.    Review of Systems  Constitutional: Negative for fever, chills, diaphoresis, activity change, appetite change, fatigue and unexpected weight change.  Respiratory: Negative for cough, chest tightness and shortness of breath.    Cardiovascular: Negative for chest pain, palpitations and leg swelling.  Gastrointestinal: Negative for nausea, vomiting, abdominal pain, diarrhea and constipation.  Genitourinary: Negative for dysuria, frequency, decreased urine volume and difficulty urinating.  Musculoskeletal: Positive for arthralgias. Negative for myalgias, back pain, joint swelling and gait problem.       Pain in her left ankle   Skin: Positive for rash. Negative for color change, pallor and wound.       Scattered papules through out her body that are painful. Papules on her feet are extremely painful.   Neurological: Negative for dizziness, weakness, light-headedness and headaches.  Hematological: Negative for adenopathy. Does not bruise/bleed easily.  Psychiatric/Behavioral: Positive for agitation. Negative for suicidal ideas, behavioral problems and self-injury. The patient is nervous/anxious.        Objective:   Physical Exam  Nursing note and vitals reviewed. Constitutional: She is oriented to person, place, and time. She appears distressed.  Obese In pain  Eyes: Conjunctivae are normal. Pupils are equal, round, and reactive to light. Right eye exhibits no discharge. Left eye exhibits no discharge. No scleral icterus.  Neck: No thyromegaly present.  Cardiovascular: Normal rate, regular rhythm and normal heart sounds.   Pulmonary/Chest: Breath sounds normal. No respiratory distress. She has no wheezes. She has no rales. She exhibits no tenderness.  Abdominal: Soft.  Musculoskeletal: Normal range of motion. She exhibits tenderness. She exhibits no edema.  Left malleolus with well  healed surgical scar with no edema or erythema, with no surrounding cellulitis but surrounded by scattered papules that are tender to palpation  Lymphadenopathy:    She has no cervical adenopathy.  Neurological: She is alert and oriented to person, place, and time.  Skin: Skin is warm and dry. Rash noted. No erythema. No pallor.   Scattered papules measuring ~1-2cm in diameter some with purple discoloration in a non erythematous base with no discharge. The papules are noted bilaterally, more so on her elbows, also on her palms, LE, and soles of her feet. The papules on the soles of her feet are erythematous with no exudate and extremely TTP, interfering with her ability to walk.   Psychiatric: She has a normal mood and affect.  Very anxious appearing with labile mood, crying at times          Assessment & Plan:

## 2013-01-15 LAB — URINALYSIS, ROUTINE W REFLEX MICROSCOPIC
Leukocytes, UA: NEGATIVE
Nitrite: POSITIVE — AB
Protein, ur: 30 mg/dL — AB
Specific Gravity, Urine: >1.03 — ABNORMAL HIGH (ref 1.005–1.030)
Urobilinogen, UA: 0.2 mg/dL (ref 0.0–1.0)

## 2013-01-15 LAB — ANA: Anti Nuclear Antibody(ANA): NEGATIVE

## 2013-01-15 LAB — URINALYSIS, MICROSCOPIC ONLY: Crystals: NONE SEEN

## 2013-01-15 LAB — C-REACTIVE PROTEIN: CRP: 7.1 mg/dL — ABNORMAL HIGH (ref <0.60)

## 2013-01-15 LAB — HEPATITIS B SURFACE ANTIGEN: Hepatitis B Surface Ag: NEGATIVE

## 2013-01-15 NOTE — Addendum Note (Signed)
Addended by: Sara Chu D on: 01/15/2013 10:25 AM   Modules accepted: Orders

## 2013-01-15 NOTE — Addendum Note (Signed)
Addended by: Sara Chu D on: 01/15/2013 09:31 AM   Modules accepted: Orders

## 2013-01-16 LAB — HEPATITIS B CORE ANTIBODY, TOTAL: Hep B Core Total Ab: NEGATIVE

## 2013-01-17 LAB — CULTURE, URINE COMPREHENSIVE: Colony Count: >=100000

## 2013-01-20 LAB — CULTURE, BLOOD (SINGLE): Organism ID, Bacteria: NO GROWTH

## 2013-01-21 ENCOUNTER — Encounter (HOSPITAL_COMMUNITY): Payer: Self-pay | Admitting: Psychiatry

## 2013-01-21 ENCOUNTER — Encounter (HOSPITAL_COMMUNITY): Payer: Self-pay

## 2013-01-21 ENCOUNTER — Ambulatory Visit (INDEPENDENT_AMBULATORY_CARE_PROVIDER_SITE_OTHER): Payer: No Typology Code available for payment source | Admitting: Psychiatry

## 2013-01-21 VITALS — BP 141/95 | HR 74 | Ht 63.0 in | Wt 205.8 lb

## 2013-01-21 DIAGNOSIS — F319 Bipolar disorder, unspecified: Secondary | ICD-10-CM

## 2013-01-21 DIAGNOSIS — F3162 Bipolar disorder, current episode mixed, moderate: Secondary | ICD-10-CM

## 2013-01-21 DIAGNOSIS — F323 Major depressive disorder, single episode, severe with psychotic features: Secondary | ICD-10-CM

## 2013-01-21 HISTORY — DX: Bipolar disorder, unspecified: F31.9

## 2013-01-21 MED ORDER — ESCITALOPRAM OXALATE 10 MG PO TABS: 10.0000 mg | ORAL_TABLET | Freq: Every day | ORAL | Status: DC

## 2013-01-21 MED ORDER — NEFAZODONE HCL 100 MG PO TABS: 100.0000 mg | ORAL_TABLET | Freq: Two times a day (BID) | ORAL | Status: DC
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
## 2013-01-22 ENCOUNTER — Ambulatory Visit (INDEPENDENT_AMBULATORY_CARE_PROVIDER_SITE_OTHER): Payer: No Typology Code available for payment source | Admitting: Internal Medicine

## 2013-01-22 ENCOUNTER — Telehealth: Payer: Self-pay | Admitting: Internal Medicine

## 2013-01-22 ENCOUNTER — Encounter: Payer: Self-pay | Admitting: Internal Medicine

## 2013-01-22 ENCOUNTER — Telehealth (HOSPITAL_COMMUNITY): Payer: Self-pay | Admitting: *Deleted

## 2013-01-22 VITALS — BP 150/110 | HR 109 | Temp 98.1°F | Ht 63.0 in | Wt 206.2 lb

## 2013-01-22 DIAGNOSIS — L02419 Cutaneous abscess of limb, unspecified: Secondary | ICD-10-CM

## 2013-01-22 NOTE — Progress Notes (Signed)
Patient ID: [name], female   DOB: 11-08-1971, 42 y.o.   MRN: [id number]         [facility] for Infectious Disease  Patient Active Problem List  Diagnosis  . Leukocytoclastic vasculitis  . Anxiety and depression  . Polysubstance abuse  . Abscess of ankle    Patient's Medications  New Prescriptions   No medications on file  Previous Medications   CLONAZEPAM (KLONOPIN) 1 MG TABLET    Take 1 mg by mouth 2 (two) times daily as needed for anxiety.   ESCITALOPRAM (LEXAPRO) 10 MG TABLET    Take 1 tablet (10 mg total) by mouth daily.   NEFAZODONE (SERZONE) 100 MG TABLET    Take 1 tablet (100 mg total) by mouth 2 (two) times daily.   OMEPRAZOLE (PRILOSEC) 40 MG CAPSULE    Take 1 capsule (40 mg total) by mouth daily.   PREDNISONE (DELTASONE) 20 MG TABLET    Take 1 tablet (20 mg total) by mouth 2 (two) times daily. Until your skin flare up is resolved.   TRAMADOL (ULTRAM) 50 MG TABLET    Take 1 tablet (50 mg total) by mouth every 6 (six) hours as needed for pain.  Modified Medications   No medications on file  Discontinued Medications   CEPHALEXIN (KEFLEX) 500 MG CAPSULE    Take 1 capsule (500 mg total) by mouth 4 (four) times daily.    Subjective: [name] is in for her followup visit. She has now been on antibiotic therapy for 4 months for her MSSA left ankle infection. She had hardware removed from her medial ankle at the site of infection. Her incision is healed completely and she is not having any current problems with her ankle. She's had no problems tolerating the cephalexin.  Fortunately, she is now established primary care in the medicine teaching clinic and psychiatric care with Dr. Lolly Mustache. She was unable to afford transportation to [facility] rheumatology clinic. She does believe she will be able to make a new appointment at [facility] rheumatology clinic later this month.  She is complaining of severe generalized pain and difficulty  sleeping. She says that she's had problem with blood blisters forming all over her body but states that she's not had any new skin lesions since she started on prednisone one week ago. She requests stronger pain medication and Ambien.  She was prescribed escitalopram and nefazodone yesterday but states that she cannot afford them.  Objective: Temp: 98.1 F (36.7 C) (03/06 1335) BP: 150/110 mmHg (03/06 1335) Pulse Rate: 109 (03/06 1335)  General: she is tearful throughout the exam Skin: her generalized skin lesions have improved and I do not see any new, active lesions. She has multiple excoriated papules on her extremities and chronic scarred lesions on her buttocks Lungs: clear Cor: regular S1 and S2 no murmurs Abdomen: obese, soft nontender Extremities: Healed left ankle incision without any evidence of infection   Assessment: I suspect that her left ankle infection has now been cured and will stop her cephalexin. I suggested that she speak with a clinical social worker in the medicine clinic to see if she can get assistance affording her new prescriptions. It seems as though her chronic rash may be improving on prednisone. I encouraged her to followup with the rheumatology visit later this month. I told her that I could not prescribe stronger pain medication or Ambien for her. Her current problems have been going on for 4-5 years.  I encouraged her to stick with her current treatment and evaluation plan and look for long-term solutions.  Plan: 1. Discontinue cephalexin and observe off of antibiotics 2. Followup here as needed   Cliffton Asters, MD [facility] for Infectious Disease [facility] [phone] pager   [phone] cell 01/22/2013, 1:57 PM

## 2013-01-22 NOTE — Telephone Encounter (Signed)
Phone call placed for discussion of lab results in regards to UA but patient unavailable--pt's sister states that pt does not have a phone but can take messages. Patient has a follow up appointment on 01/26/13. Pt's sister agreed to remind patient fo call us with concerns and to follow up on Monday.

## 2013-01-22 NOTE — Telephone Encounter (Signed)
See phone note

## 2013-01-26 ENCOUNTER — Telehealth (HOSPITAL_COMMUNITY): Payer: Self-pay

## 2013-01-26 ENCOUNTER — Other Ambulatory Visit (HOSPITAL_COMMUNITY): Payer: Self-pay | Admitting: Psychiatry

## 2013-01-26 ENCOUNTER — Encounter: Payer: No Typology Code available for payment source | Admitting: Internal Medicine

## 2013-01-26 NOTE — Telephone Encounter (Signed)
Call returned.  Spoke to her sister as patient was not available.  Recommend to have patient call us back.

## 2013-01-28 ENCOUNTER — Encounter (HOSPITAL_COMMUNITY): Payer: Self-pay

## 2013-01-28 ENCOUNTER — Telehealth: Payer: Self-pay | Admitting: Licensed Clinical Social Worker

## 2013-01-28 ENCOUNTER — Ambulatory Visit (HOSPITAL_COMMUNITY): Payer: Self-pay | Admitting: Psychiatry

## 2013-01-28 ENCOUNTER — Encounter: Payer: Self-pay | Admitting: Licensed Clinical Social Worker

## 2013-01-28 NOTE — Telephone Encounter (Signed)
Ms. Reining called into [facility].  Pt voiced concern that Behavioral Health wrote two prescriptions that are too expensive for pt.  Ms. Kil states both medications are over $100.  Pt's EMR reflects new medications of Lexapro and Serzone.  Both of these meds are not on the $4 listing, CSW encouraged pt to follow up with [facility] and [facility] to inquire if these medications are on their formulary.  In the meantime, CSW will mail Ms. Ackerley goodRx coupons to assist with the procurement of the medications.  Ms. Caporaso also states [facility] turned her down for transportation.  CSW encouraged Ms. Notte to inform [facility] she has the [employer] and needs medical transportation to [employer]  If continued difficulty, CSW informed Ms. Bloodgood to ask for Ms. Scot Jun, [facility] supervisor.

## 2013-02-03 ENCOUNTER — Ambulatory Visit (HOSPITAL_COMMUNITY): Payer: Self-pay | Admitting: Psychiatry

## 2013-03-25 ENCOUNTER — Encounter (HOSPITAL_COMMUNITY): Payer: Self-pay | Admitting: *Deleted

## 2013-03-25 ENCOUNTER — Emergency Department (HOSPITAL_COMMUNITY)
Admission: EM | Admit: 2013-03-25 | Discharge: 2013-03-25 | Disposition: A | Discharge status: Home / Self Care | Payer: No Typology Code available for payment source | Attending: Emergency Medicine | Admitting: Emergency Medicine

## 2013-03-25 DIAGNOSIS — R21 Rash and other nonspecific skin eruption: Secondary | ICD-10-CM

## 2013-03-25 DIAGNOSIS — IMO0002 Reserved for concepts with insufficient information to code with codable children: Secondary | ICD-10-CM | POA: Insufficient documentation

## 2013-03-25 DIAGNOSIS — R209 Unspecified disturbances of skin sensation: Secondary | ICD-10-CM | POA: Insufficient documentation

## 2013-03-25 DIAGNOSIS — F3289 Other specified depressive episodes: Secondary | ICD-10-CM | POA: Insufficient documentation

## 2013-03-25 DIAGNOSIS — F17200 Nicotine dependence, unspecified, uncomplicated: Secondary | ICD-10-CM | POA: Insufficient documentation

## 2013-03-25 DIAGNOSIS — Z8719 Personal history of other diseases of the digestive system: Secondary | ICD-10-CM | POA: Insufficient documentation

## 2013-03-25 DIAGNOSIS — Z79899 Other long term (current) drug therapy: Secondary | ICD-10-CM | POA: Insufficient documentation

## 2013-03-25 DIAGNOSIS — Z8739 Personal history of other diseases of the musculoskeletal system and connective tissue: Secondary | ICD-10-CM | POA: Insufficient documentation

## 2013-03-25 DIAGNOSIS — Z8679 Personal history of other diseases of the circulatory system: Secondary | ICD-10-CM | POA: Insufficient documentation

## 2013-03-25 DIAGNOSIS — N390 Urinary tract infection, site not specified: Secondary | ICD-10-CM | POA: Insufficient documentation

## 2013-03-25 DIAGNOSIS — F411 Generalized anxiety disorder: Secondary | ICD-10-CM | POA: Insufficient documentation

## 2013-03-25 DIAGNOSIS — F329 Major depressive disorder, single episode, unspecified: Secondary | ICD-10-CM | POA: Insufficient documentation

## 2013-03-25 DIAGNOSIS — R300 Dysuria: Secondary | ICD-10-CM | POA: Insufficient documentation

## 2013-03-25 DIAGNOSIS — R350 Frequency of micturition: Secondary | ICD-10-CM | POA: Insufficient documentation

## 2013-03-25 LAB — CBC WITH DIFFERENTIAL/PLATELET
Basophils Absolute: 0 10*3/uL (ref 0.0–0.1)
Basophils Relative: 1 % (ref 0–1)
Eosinophils Absolute: 0.1 10*3/uL (ref 0.0–0.7)
Eosinophils Relative: 1 % (ref 0–5)
HCT: 38.9 % (ref 36.0–46.0)
Hemoglobin: 13.6 g/dL (ref 12.0–15.0)
Lymphocytes Relative: 24 % (ref 12–46)
Lymphs Abs: 1.5 10*3/uL (ref 0.7–4.0)
MCH: 27.7 pg (ref 26.0–34.0)
MCHC: 35 g/dL (ref 30.0–36.0)
MCV: 79.2 fL (ref 78.0–100.0)
Monocytes Absolute: 0.3 10*3/uL (ref 0.1–1.0)
Monocytes Relative: 6 % (ref 3–12)
Neutro Abs: 4.3 10*3/uL (ref 1.7–7.7)
Neutrophils Relative %: 69 % (ref 43–77)
Platelets: 241 10*3/uL (ref 150–400)
RBC: 4.91 MIL/uL (ref 3.87–5.11)
RDW: 13.9 % (ref 11.5–15.5)
WBC: 6.1 10*3/uL (ref 4.0–10.5)

## 2013-03-25 LAB — COMPREHENSIVE METABOLIC PANEL
ALT: 6 U/L (ref 0–35)
AST: 14 U/L (ref 0–37)
Albumin: 3 g/dL — ABNORMAL LOW (ref 3.5–5.2)
Alkaline Phosphatase: 72 U/L (ref 39–117)
BUN: 8 mg/dL (ref 6–23)
CO2: 25 mEq/L (ref 19–32)
Calcium: 9.1 mg/dL (ref 8.4–10.5)
Chloride: 99 mEq/L (ref 96–112)
Creatinine, Ser: 0.7 mg/dL (ref 0.50–1.10)
GFR calc Af Amer: >90 mL/min (ref >90)
GFR calc non Af Amer: >90 mL/min (ref >90)
Glucose, Bld: 116 mg/dL — ABNORMAL HIGH (ref 70–99)
Potassium: 3.1 mEq/L — ABNORMAL LOW (ref 3.5–5.1)
Sodium: 136 mEq/L (ref 135–145)
Total Bilirubin: 0.3 mg/dL (ref 0.3–1.2)
Total Protein: 7.2 g/dL (ref 6.0–8.3)

## 2013-03-25 LAB — URINALYSIS, ROUTINE W REFLEX MICROSCOPIC
Bilirubin Urine: NEGATIVE
Glucose, UA: NEGATIVE mg/dL
Ketones, ur: 15 mg/dL — AB
Nitrite: NEGATIVE
Protein, ur: NEGATIVE mg/dL
Specific Gravity, Urine: 1.018 (ref 1.005–1.030)
Urobilinogen, UA: 0.2 mg/dL (ref 0.0–1.0)
pH: 7 (ref 5.0–8.0)

## 2013-03-25 LAB — URINE MICROSCOPIC-ADD ON

## 2013-03-25 MED ORDER — POTASSIUM CHLORIDE CRYS ER 20 MEQ PO TBCR
40.0000 meq | EXTENDED_RELEASE_TABLET | Freq: Once | ORAL | Status: AC
  Administered 2013-03-25: 40 meq via ORAL
  Filled 2013-03-25: qty 2

## 2013-03-25 MED ORDER — HYDROMORPHONE HCL PF 1 MG/ML IJ SOLN
1.0000 mg | Freq: Once | INTRAMUSCULAR | Status: AC
  Administered 2013-03-25: 1 mg via INTRAVENOUS
  Filled 2013-03-25: qty 1

## 2013-03-25 MED ORDER — HYDROCODONE-ACETAMINOPHEN 5-325 MG PO TABS: 1.0000 | ORAL_TABLET | ORAL | Status: DC | PRN

## 2013-03-25 MED ORDER — METHYLPREDNISOLONE SODIUM SUCC 125 MG IJ SOLR
125.0000 mg | Freq: Once | INTRAMUSCULAR | Status: AC
  Administered 2013-03-25: 125 mg via INTRAVENOUS
  Filled 2013-03-25: qty 2

## 2013-03-25 MED ORDER — CIPROFLOXACIN HCL 500 MG PO TABS: 500.0000 mg | ORAL_TABLET | Freq: Two times a day (BID) | ORAL | Status: DC

## 2013-03-25 MED ORDER — PREDNISONE 20 MG PO TABS: 40.0000 mg | ORAL_TABLET | Freq: Every day | ORAL | Status: DC

## 2013-03-25 NOTE — ED Notes (Addendum)
Pt arrives from home with c/o lupus pain all over her body. EMS reports legs are broke out. Pt has lesions to arms, legs, entire body. Reports flare started 2 days ago.

## 2013-03-25 NOTE — ED Provider Notes (Signed)
History     CSN: [id number]  Arrival date & time 03/25/13  1255   First MD Initiated Contact with Patient 03/25/13 1504      Chief Complaint  Patient presents with  . Lupus    (Consider location/radiation/quality/duration/timing/severity/associated sxs/prior treatment) HPI  Pt is a 42 yo F PMHx significant for SLE presenting to the ED for a lupus flare up that began two days ago in the form of a rash. Pt states she has a rash that began on hands and has spread to arms, trunk, back, and legs. States the rash has opened up and been bleeding especially on her legs, but denies any purulent drainage. States pain is a 10/10 w/ no alleviating factors. Pain is aggravated by laying on back. Pt states she does not see a Rheumatologist for her SLE and is currently out of her prednisone that her PCP prescribes her for her SLE. Pt is also having dyuria w/o hematuria. Denies any CP, SOB, fevers, chills, abdominal pain.   Past Medical History  Diagnosis Date  . Depression   . Chronic pain   . Leukocytoclastic vasculitis   . Tobacco abuse   . Lupus   . Lupus   . Arthritis   . Anxiety   . GERD (gastroesophageal reflux disease)     Past Surgical History  Procedure Laterality Date  . Cholecystectomy    . Tubal ligation    . Ankle surgery    . Hardware removal  09/25/2012    Procedure: HARDWARE REMOVAL;  Surgeon: Kerrin Champagne, MD;  Location: [facility] OR;  Service: Orthopedics;  Laterality: Left;  Incision, drainage and debridement of left lateral ankle, removal of plate and screws    Family History  Problem Relation Age of Onset  . Stroke Mother     History  Substance Use Topics  . Smoking status: Current Every Day Smoker -- 1.00 packs/day    Types: Cigarettes  . Smokeless tobacco: Never Used  . Alcohol Use: No    OB History   Grav Para Term Preterm Abortions TAB SAB Ect Mult Living                  Review of Systems  Constitutional: Negative for fever and chills.  HENT: Negative.    Eyes: Negative for visual disturbance.  Respiratory: Negative for shortness of breath.   Cardiovascular: Negative for chest pain.  Gastrointestinal: Negative for nausea, vomiting, abdominal pain and diarrhea.  Genitourinary: Positive for dysuria and frequency. Negative for hematuria, flank pain, decreased urine volume and pelvic pain.  Musculoskeletal: Negative for back pain.  Skin: Positive for rash.  Neurological: Negative for headaches.    Allergies  Review of patient's allergies indicates no known allergies.  Home Medications   Current Outpatient Rx  Name  Route  Sig  Dispense  Refill  . clonazePAM (KLONOPIN) 1 MG tablet   Oral   Take 1 mg by mouth 2 (two) times daily as needed for anxiety.         . predniSONE (DELTASONE) 20 MG tablet   Oral   Take 1 tablet (20 mg total) by mouth 2 (two) times daily. Until your skin flare up is resolved.   30 tablet   0   . ciprofloxacin (CIPRO) 500 MG tablet   Oral   Take 1 tablet (500 mg total) by mouth 2 (two) times daily.   6 tablet   0   . escitalopram (LEXAPRO) 10 MG tablet   Oral  Take 1 tablet (10 mg total) by mouth daily.   30 tablet   0   . HYDROcodone-acetaminophen (NORCO/VICODIN) 5-325 MG per tablet   Oral   Take 1-2 tablets by mouth every 4 (four) hours as needed for pain.   12 tablet   0   . nefazodone (SERZONE) 100 MG tablet   Oral   Take 1 tablet (100 mg total) by mouth 2 (two) times daily.   60 tablet   0   . predniSONE (DELTASONE) 20 MG tablet   Oral   Take 2 tablets (40 mg total) by mouth daily.   10 tablet   0     BP 122/74  Pulse 94  Temp(Src) 98.9 F (37.2 C) (Oral)  Resp 16  SpO2 95%  LMP 03/16/2013  Physical Exam  Constitutional: She is oriented to person, place, and time. She appears well-developed and well-nourished.  HENT:  Head: Normocephalic and atraumatic.  Eyes: EOM are normal. Pupils are equal, round, and reactive to light.  Cardiovascular: Normal rate, regular rhythm  and normal heart sounds.   Pulmonary/Chest: Effort normal and breath sounds normal.  Abdominal: Soft. Bowel sounds are normal.  Neurological: She is alert and oriented to person, place, and time.  Skin: Skin is warm and dry. Rash noted. Rash is maculopapular.  Discoid rash on arms, legs, trunk, and back w/o involvement of the face. No purulent drainage noted from rash.   Psychiatric: She has a normal mood and affect.    ED Course  Procedures (including critical care time)  Labs Reviewed  COMPREHENSIVE METABOLIC PANEL - Abnormal; Notable for the following:    Potassium 3.1 (*)    Glucose, Bld 116 (*)    Albumin 3.0 (*)    All other components within normal limits  URINALYSIS, ROUTINE W REFLEX MICROSCOPIC - Abnormal; Notable for the following:    APPearance TURBID (*)    Hgb urine dipstick SMALL (*)    Ketones, ur 15 (*)    Leukocytes, UA LARGE (*)    All other components within normal limits  URINE MICROSCOPIC-ADD ON - Abnormal; Notable for the following:    Squamous Epithelial / LPF MANY (*)    Bacteria, UA MANY (*)    All other components within normal limits  URINE CULTURE  CBC WITH DIFFERENTIAL   No results found.   1. Rash   2. UTI (urinary tract infection)       MDM  No evidence of SJS or necrotizing fasciitis. Do not suspect pemphigus vulgaris. Most likely d/t Lupus Flare Up given discoid appearing rash and history of SLE rashes. At this time there does not appear to be any evidence of an acute emergency medical condition and the patient appears stable for discharge with appropriate outpatient follow up. Diagnosis and warning signs of increasing infection were discussed with patient who verbalizes understanding and is agreeable to discharge. Given Prednisone for rash and pain medications. Pt has also been diagnosed with a UTI. Pt is afebrile, no CVA tenderness, normotensive, and denies N/V. Pt to be dc home with antibiotics and instructions to follow up with PCP if  symptoms persist. Pt case discussed with Dr. Juleen China who agrees with my plan.          Jeannetta Ellis, PA-C 03/26/13 0113

## 2013-03-26 NOTE — ED Provider Notes (Signed)
Medical screening examination/treatment/procedure(s) were conducted as a shared visit with non-physician practitioner(s) and myself.  I personally evaluated the patient during the encounter.  Patient with rash. 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 Plan symptomatic treatment and a course of steroids. Emergent return precautions discussed.  Raeford Razor, MD 03/26/13 [phone]

## 2013-03-27 LAB — URINE CULTURE: Colony Count: >=100000

## 2013-03-28 ENCOUNTER — Telehealth (HOSPITAL_COMMUNITY): Payer: Self-pay | Admitting: Emergency Medicine

## 2013-03-28 NOTE — ED Notes (Signed)
Post ED Visit - Positive Culture Follow-up  Culture report reviewed by antimicrobial stewardship pharmacist: []  [name], Pharm.D., BCPS []  [name], Pharm.D., BCPS [x]  [name], [address].D., BCPS []  [name], [address].D., BCPS, AAHIVP []  [name], Pharm.D., BCPS, AAHIV  Positive urine culture Treated with Cipro, organism sensitive to the same and no further patient follow-up is required at this time.  [name] 03/28/2013, 5:27 PM

## 2013-05-25 ENCOUNTER — Emergency Department (HOSPITAL_COMMUNITY)
Admission: EM | Admit: 2013-05-25 | Discharge: 2013-05-25 | Disposition: A | Discharge status: Home / Self Care | Payer: Self-pay | Attending: Emergency Medicine | Admitting: Emergency Medicine

## 2013-05-25 ENCOUNTER — Encounter (HOSPITAL_COMMUNITY): Payer: Self-pay | Admitting: Emergency Medicine

## 2013-05-25 ENCOUNTER — Encounter: Payer: Self-pay | Admitting: *Deleted

## 2013-05-25 DIAGNOSIS — K0381 Cracked tooth: Secondary | ICD-10-CM | POA: Insufficient documentation

## 2013-05-25 DIAGNOSIS — F17200 Nicotine dependence, unspecified, uncomplicated: Secondary | ICD-10-CM | POA: Insufficient documentation

## 2013-05-25 DIAGNOSIS — F419 Anxiety disorder, unspecified: Secondary | ICD-10-CM

## 2013-05-25 DIAGNOSIS — R51 Headache: Secondary | ICD-10-CM | POA: Insufficient documentation

## 2013-05-25 DIAGNOSIS — F3289 Other specified depressive episodes: Secondary | ICD-10-CM | POA: Insufficient documentation

## 2013-05-25 DIAGNOSIS — K037 Posteruptive color changes of dental hard tissues: Secondary | ICD-10-CM | POA: Insufficient documentation

## 2013-05-25 DIAGNOSIS — M310 Hypersensitivity angiitis: Secondary | ICD-10-CM | POA: Insufficient documentation

## 2013-05-25 DIAGNOSIS — N189 Chronic kidney disease, unspecified: Secondary | ICD-10-CM | POA: Insufficient documentation

## 2013-05-25 DIAGNOSIS — M329 Systemic lupus erythematosus, unspecified: Secondary | ICD-10-CM | POA: Insufficient documentation

## 2013-05-25 DIAGNOSIS — K0889 Other specified disorders of teeth and supporting structures: Secondary | ICD-10-CM

## 2013-05-25 DIAGNOSIS — R000 Tachycardia, unspecified: Secondary | ICD-10-CM | POA: Insufficient documentation

## 2013-05-25 DIAGNOSIS — Z8614 Personal history of Methicillin resistant Staphylococcus aureus infection: Secondary | ICD-10-CM | POA: Insufficient documentation

## 2013-05-25 DIAGNOSIS — K219 Gastro-esophageal reflux disease without esophagitis: Secondary | ICD-10-CM | POA: Insufficient documentation

## 2013-05-25 DIAGNOSIS — F411 Generalized anxiety disorder: Secondary | ICD-10-CM | POA: Insufficient documentation

## 2013-05-25 DIAGNOSIS — M129 Arthropathy, unspecified: Secondary | ICD-10-CM | POA: Insufficient documentation

## 2013-05-25 DIAGNOSIS — G8929 Other chronic pain: Secondary | ICD-10-CM | POA: Insufficient documentation

## 2013-05-25 DIAGNOSIS — Z79899 Other long term (current) drug therapy: Secondary | ICD-10-CM | POA: Insufficient documentation

## 2013-05-25 DIAGNOSIS — F329 Major depressive disorder, single episode, unspecified: Secondary | ICD-10-CM | POA: Insufficient documentation

## 2013-05-25 DIAGNOSIS — K029 Dental caries, unspecified: Secondary | ICD-10-CM | POA: Insufficient documentation

## 2013-05-25 HISTORY — DX: Methicillin resistant Staphylococcus aureus infection, unspecified site: A49.02

## 2013-05-25 MED ORDER — HYDROCODONE-ACETAMINOPHEN 5-325 MG PO TABS: 1.0000 | ORAL_TABLET | Freq: Three times a day (TID) | ORAL | Status: DC | PRN

## 2013-05-25 MED ORDER — IBUPROFEN 800 MG PO TABS
800.0000 mg | ORAL_TABLET | Freq: Three times a day (TID) | ORAL | Status: DC | PRN
Start: 2013-05-25 — End: 2013-08-23

## 2013-05-25 MED ORDER — PENICILLIN V POTASSIUM 500 MG PO TABS: 500.0000 mg | ORAL_TABLET | Freq: Four times a day (QID) | ORAL | Status: AC

## 2013-05-25 NOTE — ED Notes (Signed)
Pt started 1 week ago with right upper dental pain. ALSO, pt asking for a note to give her employer that she is clear of MRSA. States she was a patient of [facility] but has been in jail until 3 days ago so is no longer a pt there.

## 2013-05-25 NOTE — ED Provider Notes (Signed)
History    CSN: [id number] Arrival date & time 05/25/13  0845  First MD Initiated Contact with Patient 05/25/13 [phone]     Chief Complaint  Patient presents with  . Dental Pain   (Consider location/radiation/quality/duration/timing/severity/associated sxs/prior Treatment) Patient is a 42 y.o. female presenting with tooth pain. The history is provided by the patient. No language interpreter was used.  Dental Pain Associated symptoms: headaches   Associated symptoms: no fever and no neck pain   Jadasia L Dearinger is a 42 y/o F with PMHx of depression, chronic pain, Lupus, GERD, MRSA, arthritis presenting to the ED with dental pain, localized to the right upper maxillary region that has been ongoing for the past 2-3 days. Pain described as a throbbing sensation that is constant with radiation to the right ear and side of the neck, worsens with chewing and cold fluids, better with advil. Stated that she has been using ice and gargling - negative relief. Reported that she does have mild bleeding when brushing her teeth. Stated that the last time she saw a dentist was 4-5 years ago. Associated symptoms are headache. Denied fever, chills, neck pain, neck stiffness, chest pain, shortness of breath, difficulty breathing, abscess or cyst formation, difficulty swallowing.  PCP none    Past Medical History  Diagnosis Date  . Depression   . Chronic pain   . Leukocytoclastic vasculitis   . Tobacco abuse   . Lupus   . Lupus   . Arthritis   . Anxiety   . GERD (gastroesophageal reflux disease)   . MRSA (methicillin resistant Staphylococcus aureus)    Past Surgical History  Procedure Laterality Date  . Cholecystectomy    . Tubal ligation    . Ankle surgery    . Hardware removal  09/25/2012    Procedure: HARDWARE REMOVAL;  Surgeon: Kerrin Champagne, MD;  Location: [facility] OR;  Service: Orthopedics;  Laterality: Left;  Incision, drainage and debridement of left lateral ankle, removal of plate and screws   Family  History  Problem Relation Age of Onset  . Stroke Mother    History  Substance Use Topics  . Smoking status: Current Every Day Smoker -- 1.00 packs/day    Types: Cigarettes  . Smokeless tobacco: Never Used  . Alcohol Use: No   OB History   Grav Para Term Preterm Abortions TAB SAB Ect Mult Living                 Review of Systems  Constitutional: Negative for fever and chills.  HENT: Positive for dental problem. Negative for sore throat, trouble swallowing, neck pain and neck stiffness.   Eyes: Negative for pain.  Respiratory: Negative for chest tightness and shortness of breath.   Cardiovascular: Negative for chest pain.  Neurological: Positive for headaches. Negative for dizziness, weakness and numbness.  All other systems reviewed and are negative.    Allergies  Review of patient's allergies indicates no known allergies.  Home Medications   Current Outpatient Rx  Name  Route  Sig  Dispense  Refill  . clonazePAM (KLONOPIN) 1 MG tablet   Oral   Take 1 mg by mouth 2 (two) times daily as needed for anxiety.         Marland Kitchen escitalopram (LEXAPRO) 10 MG tablet   Oral   Take 1 tablet (10 mg total) by mouth daily.   30 tablet   0   . ibuprofen (ADVIL,MOTRIN) 200 MG tablet   Oral   Take 800  mg by mouth every 6 (six) hours as needed for pain.         . predniSONE (DELTASONE) 20 MG tablet   Oral   Take 2 tablets (40 mg total) by mouth daily.   10 tablet   0   . HYDROcodone-acetaminophen (NORCO) 5-325 MG per tablet   Oral   Take 1 tablet by mouth every 8 (eight) hours as needed for pain.   9 tablet   0   . ibuprofen (ADVIL,MOTRIN) 800 MG tablet   Oral   Take 1 tablet (800 mg total) by mouth every 8 (eight) hours as needed for pain.   21 tablet   0   . penicillin v potassium (VEETID) 500 MG tablet   Oral   Take 1 tablet (500 mg total) by mouth 4 (four) times daily.   40 tablet   0    BP 119/82  Pulse 102  Temp(Src) 97.5 F (36.4 C) (Oral)  SpO2 99%  LMP  05/20/2013 Physical Exam  Nursing note and vitals reviewed. Constitutional: She is oriented to person, place, and time. She appears well-developed and well-nourished. No distress.  HENT:  Head: Normocephalic and atraumatic.  Mouth/Throat: Oropharynx is clear and moist. No oropharyngeal exudate.    Negative facial swelling noted. Mild discomfort upon palpation to the right maxillary region, right cheek.   Mouth: Uvula midline, symmetrical elevation. Poor dentition noted. Numerous teeth missing to uppr and lower jaw. second upper right molar decayed at the base, cracked tooth - pain upon palpation with tongue depressor. Negative abscess and cyst formation. Negative sublingual lesions. Negative trismus. Negative erythema, inflammation, swelling, lesions, sores to buccal mucosa bilaterally and gums.     Eyes: Conjunctivae and EOM are normal. Pupils are equal, round, and reactive to light. Right eye exhibits no discharge. Left eye exhibits no discharge.  Neck: Normal range of motion. Neck supple.  Negative neck stiffness Negative nuchal rigidity  Cardiovascular: Normal rate, regular rhythm and normal heart sounds.  Exam reveals no friction rub.   No murmur heard. Mildly tachycardic upon arrival - patient normal rhythm during exam  Pulmonary/Chest: Effort normal and breath sounds normal. No respiratory distress. She has no wheezes. She has no rales.  Lymphadenopathy:    She has no cervical adenopathy.  Neurological: She is alert and oriented to person, place, and time. No cranial nerve deficit. She exhibits normal muscle tone. Coordination normal.  Cranial nerves III-XII grossly intact  Skin: Skin is warm and dry. No rash noted. She is not diaphoretic. No erythema.  Psychiatric: She has a normal mood and affect. Her behavior is normal. Thought content normal.    ED Course  Procedures (including critical care time)  Patient requesting note for work stating that she is cleared from MRSA -  stated that she was treated here not that long ago for MRSA infection. Patient stated that she needs a note for work. Reported that she was treated by a group of physicians. Discussed with patient that since I did not treat her she needs to get in contact with the physicians regarding this and to get the note clearance from them - patient understood. Recommended patient to call MR to see which physicians she was dealing with and to take it from there.    Labs Reviewed - No data to display No results found. 1. Pain, dental   2. Lupus   3. Anxiety     MDM  Patient presenting to ED with dental pain. Negative signs  of infection. Negative abscess or cyst formation. Negative sublingual lesions noted, negative trismus - doubt Ludwig's Angina. Patient stable, afebrile. Discharged patient with antibiotics and pain medications - discussed course, precautions, and disposal technique. Referred patient to dentist. Discussed will most likely need tooth extracted. Discussed with patient to continue to monitor symptoms and if symptoms are to worsen or change to report back to the ED - strict return instructions given.  Patient agreed to plan of care, understood, all questions answered.   Raymon Mutton, PA-C 05/25/13 1858

## 2013-05-26 NOTE — ED Provider Notes (Signed)
Medical screening examination/treatment/procedure(s) were performed by non-physician practitioner and as supervising physician I was immediately available for consultation/collaboration.  Toy Baker, MD 05/26/13 1515

## 2013-08-23 ENCOUNTER — Emergency Department (HOSPITAL_COMMUNITY)
Admission: EM | Admit: 2013-08-23 | Discharge: 2013-08-23 | Disposition: A | Discharge status: Home / Self Care | Payer: Medicaid Other | Attending: Emergency Medicine | Admitting: Emergency Medicine

## 2013-08-23 ENCOUNTER — Encounter (HOSPITAL_COMMUNITY): Payer: Self-pay | Admitting: Emergency Medicine

## 2013-08-23 DIAGNOSIS — Z8614 Personal history of Methicillin resistant Staphylococcus aureus infection: Secondary | ICD-10-CM | POA: Insufficient documentation

## 2013-08-23 DIAGNOSIS — M329 Systemic lupus erythematosus, unspecified: Secondary | ICD-10-CM | POA: Insufficient documentation

## 2013-08-23 DIAGNOSIS — Z79899 Other long term (current) drug therapy: Secondary | ICD-10-CM | POA: Insufficient documentation

## 2013-08-23 DIAGNOSIS — M79609 Pain in unspecified limb: Secondary | ICD-10-CM | POA: Diagnosis present

## 2013-08-23 DIAGNOSIS — Z791 Long term (current) use of non-steroidal anti-inflammatories (NSAID): Secondary | ICD-10-CM | POA: Insufficient documentation

## 2013-08-23 DIAGNOSIS — F411 Generalized anxiety disorder: Secondary | ICD-10-CM | POA: Insufficient documentation

## 2013-08-23 DIAGNOSIS — F17200 Nicotine dependence, unspecified, uncomplicated: Secondary | ICD-10-CM | POA: Diagnosis not present

## 2013-08-23 DIAGNOSIS — E669 Obesity, unspecified: Secondary | ICD-10-CM | POA: Insufficient documentation

## 2013-08-23 DIAGNOSIS — F39 Unspecified mood [affective] disorder: Secondary | ICD-10-CM | POA: Insufficient documentation

## 2013-08-23 DIAGNOSIS — M129 Arthropathy, unspecified: Secondary | ICD-10-CM | POA: Diagnosis not present

## 2013-08-23 DIAGNOSIS — Z8719 Personal history of other diseases of the digestive system: Secondary | ICD-10-CM | POA: Diagnosis not present

## 2013-08-23 DIAGNOSIS — R238 Other skin changes: Secondary | ICD-10-CM

## 2013-08-23 DIAGNOSIS — G8929 Other chronic pain: Secondary | ICD-10-CM | POA: Insufficient documentation

## 2013-08-23 DIAGNOSIS — Z8679 Personal history of other diseases of the circulatory system: Secondary | ICD-10-CM | POA: Insufficient documentation

## 2013-08-23 DIAGNOSIS — L988 Other specified disorders of the skin and subcutaneous tissue: Secondary | ICD-10-CM | POA: Insufficient documentation

## 2013-08-23 DIAGNOSIS — F319 Bipolar disorder, unspecified: Secondary | ICD-10-CM | POA: Insufficient documentation

## 2013-08-23 HISTORY — DX: Bipolar disorder, unspecified: F31.9

## 2013-08-23 MED ORDER — OXYCODONE-ACETAMINOPHEN 5-325 MG PO TABS
1.0000 | ORAL_TABLET | Freq: Once | ORAL | Status: AC
  Administered 2013-08-23: 1 via ORAL
  Filled 2013-08-23: qty 1

## 2013-08-23 MED ORDER — OXYCODONE-ACETAMINOPHEN 5-325 MG PO TABS: 1.0000 | ORAL_TABLET | ORAL | Status: DC | PRN

## 2013-08-23 MED ORDER — PREDNISONE 20 MG PO TABS: 20.0000 mg | ORAL_TABLET | Freq: Two times a day (BID) | ORAL | Status: DC

## 2013-08-23 NOTE — ED Provider Notes (Signed)
CSN: [id number]     Arrival date & time 08/23/13  4098 History   First MD Initiated Contact with Patient 08/23/13 [phone]     Chief Complaint  Patient presents with  . Extremity Pain   (Consider location/radiation/quality/duration/timing/severity/associated sxs/prior Treatment) HPI Comments: [name] is a 42 y.o. female who states that she has sores on her arms and legs for several weeks that are worsening and painful. She has had similar lesions in the past. She is not currently seeing a primary care provider, or a rheumatologist; because she has no insurance. She is working on getting a "orange card". After that, she will be able to see her medical providers. 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 She has tried over-the-counter analgesia, without relief of her pain. She denies fever, chills, nausea, vomiting, weakness, or dizziness. There are no other known modifying factors.  Patient is a 42 y.o. female presenting with extremity pain. The history is provided by the patient.  Extremity Pain    Past Medical History  Diagnosis Date  . Depression   . Chronic pain   . Leukocytoclastic vasculitis   . Tobacco abuse   . Lupus   . Lupus   . Arthritis   . Anxiety   . GERD (gastroesophageal reflux disease)   . MRSA (methicillin resistant Staphylococcus aureus)   . Bipolar disorder 01/21/13   Past Surgical History  Procedure Laterality Date  . Cholecystectomy    . Tubal ligation    . Ankle surgery    . Hardware removal  09/25/2012    Procedure: HARDWARE REMOVAL;  Surgeon: Kerrin Champagne, MD;  Location: [facility] OR;  Service: Orthopedics;  Laterality: Left;  Incision, drainage and debridement of left lateral ankle, removal of plate and screws   Family History  Problem Relation Age of Onset  . Stroke Mother    History  Substance Use Topics  . Smoking status: Current Every Day Smoker -- 1.00 packs/day    Types: Cigarettes  . Smokeless tobacco: Never Used  .  Alcohol Use: No   OB History   Grav Para Term Preterm Abortions TAB SAB Ect Mult Living                 Review of Systems  All other systems reviewed and are negative.    Allergies  Review of patient's allergies indicates no known allergies.  Home Medications   Current Outpatient Rx  Name  Route  Sig  Dispense  Refill  . diphenhydramine-acetaminophen (TYLENOL PM) 25-500 MG TABS   Oral   Take 2 tablets by mouth at bedtime as needed (Pain/sleep aid).         Marland Kitchen escitalopram (LEXAPRO) 10 MG tablet   Oral   Take 1 tablet (10 mg total) by mouth daily.   30 tablet   0   . meloxicam (MOBIC) 7.5 MG tablet   Oral   Take 7.5 mg by mouth every morning.         Marland Kitchen QUEtiapine (SEROQUEL XR) 300 MG 24 hr tablet   Oral   Take 300 mg by mouth at bedtime.         Marland Kitchen ibuprofen (ADVIL,MOTRIN) 200 MG tablet   Oral   Take 800 mg by mouth every 6 (six) hours as needed for pain.         Marland Kitchen oxyCODONE-acetaminophen (PERCOCET) 5-325 MG per tablet   Oral   Take 1 tablet by mouth every 4 (  four) hours as needed for pain.   20 tablet   0   . predniSONE (DELTASONE) 20 MG tablet   Oral   Take 1 tablet (20 mg total) by mouth 2 (two) times daily.   10 tablet   0    BP 144/98  Pulse 107  Wt 189 lb (85.73 kg)  BMI 33.49 kg/m2  SpO2 100%  LMP 08/21/2013 Physical Exam  Nursing note and vitals reviewed. Constitutional: She is oriented to person, place, and time. She appears well-developed.  Obese  HENT:  Head: Normocephalic and atraumatic.  Eyes: Conjunctivae and EOM are normal. Pupils are equal, round, and reactive to light.  Neck: Normal range of motion and phonation normal. Neck supple.  Cardiovascular: Normal rate, regular rhythm and intact distal pulses.   Pulmonary/Chest: Effort normal and breath sounds normal. She exhibits no tenderness.  Abdominal: Soft. She exhibits no distension. There is no tenderness. There is no guarding.  Musculoskeletal: Normal range of motion. She  exhibits no edema.  No large joint deformities or effusions.  Neurological: She is alert and oriented to person, place, and time. She exhibits normal muscle tone.  Skin: Skin is warm and dry.  Scattered red, nodular papules, 3-5 mm, some with excoriations; a nondiscrete pattern of the arms, and legs, bilaterally. No truncal lesions. No associated petechiae, fluctuance, induration, or drainage.  Psychiatric: She has a normal mood and affect. Her behavior is normal. Judgment and thought content normal.    ED Course  Procedures (including critical care time)  Chart review indicates prior care at the outpatient clinics. They note that she has had some lesions, in the past, treated with prednisone successfully. In February 2014 she had an evaluation for rheumatologic disorders, with the only abnormality being mildly elevated C-reactive protein. She's not currently receiving treatment for leukoclastic vasculitis.   Review of [place], controlled substances, reporting website; not indicated. Pattern of narcotics abuse.      MDM   1. Papules      Recurrent nonspecific skin nodules, without apparent localized infection or systemic illness. Patient has access to followup care, and is stable for discharge, with treatment and expectant management.  Nursing Notes Reviewed/ Care Coordinated, and agree without changes. Applicable Imaging Reviewed.  Interpretation of Laboratory Data incorporated into ED treatment   Plan: Home Medications- prednisone, Percocet; Home Treatments and Observation- rest, heat, watch for progressive symptoms; return here if the recommended treatment, does not improve the symptoms; Recommended follow up- PCP, for follow up evaluation ASAP      Flint Melter, MD 08/23/13 [phone]

## 2013-08-23 NOTE — ED Notes (Signed)
Pt from home c/o bilateral wrist ,elbow and ankle pain and swelling. Pt states due to Lupus, has hx of same. Some swelling noted to ankle.SP ankle surgery 3 months

## 2013-09-03 ENCOUNTER — Emergency Department (HOSPITAL_COMMUNITY): Payer: Medicaid Other

## 2013-09-03 ENCOUNTER — Encounter (HOSPITAL_COMMUNITY): Payer: Self-pay | Admitting: Emergency Medicine

## 2013-09-03 ENCOUNTER — Observation Stay (HOSPITAL_COMMUNITY)
Admission: EM | Admit: 2013-09-03 | Discharge: 2013-09-05 | Disposition: A | Discharge status: Home / Self Care | Payer: Medicaid Other | Attending: Infectious Diseases | Admitting: Infectious Diseases

## 2013-09-03 DIAGNOSIS — F17200 Nicotine dependence, unspecified, uncomplicated: Secondary | ICD-10-CM | POA: Diagnosis not present

## 2013-09-03 DIAGNOSIS — K219 Gastro-esophageal reflux disease without esophagitis: Secondary | ICD-10-CM | POA: Insufficient documentation

## 2013-09-03 DIAGNOSIS — G8929 Other chronic pain: Secondary | ICD-10-CM | POA: Diagnosis present

## 2013-09-03 DIAGNOSIS — F1910 Other psychoactive substance abuse, uncomplicated: Secondary | ICD-10-CM

## 2013-09-03 DIAGNOSIS — F411 Generalized anxiety disorder: Secondary | ICD-10-CM | POA: Insufficient documentation

## 2013-09-03 DIAGNOSIS — F32A Depression, unspecified: Secondary | ICD-10-CM | POA: Diagnosis present

## 2013-09-03 DIAGNOSIS — R262 Difficulty in walking, not elsewhere classified: Secondary | ICD-10-CM | POA: Diagnosis not present

## 2013-09-03 DIAGNOSIS — M2550 Pain in unspecified joint: Secondary | ICD-10-CM | POA: Diagnosis not present

## 2013-09-03 DIAGNOSIS — M310 Hypersensitivity angiitis: Secondary | ICD-10-CM | POA: Diagnosis present

## 2013-09-03 DIAGNOSIS — F329 Major depressive disorder, single episode, unspecified: Secondary | ICD-10-CM | POA: Diagnosis present

## 2013-09-03 DIAGNOSIS — F319 Bipolar disorder, unspecified: Secondary | ICD-10-CM | POA: Diagnosis not present

## 2013-09-03 DIAGNOSIS — R490 Dysphonia: Secondary | ICD-10-CM | POA: Diagnosis present

## 2013-09-03 DIAGNOSIS — D72829 Elevated white blood cell count, unspecified: Secondary | ICD-10-CM

## 2013-09-03 DIAGNOSIS — R000 Tachycardia, unspecified: Secondary | ICD-10-CM

## 2013-09-03 DIAGNOSIS — F419 Anxiety disorder, unspecified: Secondary | ICD-10-CM

## 2013-09-03 DIAGNOSIS — R21 Rash and other nonspecific skin eruption: Secondary | ICD-10-CM

## 2013-09-03 DIAGNOSIS — M329 Systemic lupus erythematosus, unspecified: Secondary | ICD-10-CM | POA: Diagnosis present

## 2013-09-03 LAB — COMPREHENSIVE METABOLIC PANEL
ALT: 12 U/L (ref 0–35)
AST: 13 U/L (ref 0–37)
Albumin: 3.7 g/dL (ref 3.5–5.2)
Alkaline Phosphatase: 92 U/L (ref 39–117)
CO2: 21 mEq/L (ref 19–32)
Chloride: 100 mEq/L (ref 96–112)
GFR calc non Af Amer: >90 mL/min (ref >90)
Potassium: 4.1 mEq/L (ref 3.5–5.1)
Sodium: 135 mEq/L (ref 135–145)
Total Bilirubin: 0.3 mg/dL (ref 0.3–1.2)

## 2013-09-03 LAB — CBC
MCV: 84.4 fL (ref 78.0–100.0)
Platelets: 285 10*3/uL (ref 150–400)
RBC: 4.67 MIL/uL (ref 3.87–5.11)
RDW: 14.4 % (ref 11.5–15.5)
WBC: 14.2 10*3/uL — ABNORMAL HIGH (ref 4.0–10.5)

## 2013-09-03 LAB — RAPID URINE DRUG SCREEN, HOSP PERFORMED
Amphetamines: POSITIVE — AB
Barbiturates: NOT DETECTED
Opiates: POSITIVE — AB
Tetrahydrocannabinol: NOT DETECTED

## 2013-09-03 LAB — URINALYSIS, ROUTINE W REFLEX MICROSCOPIC
Glucose, UA: 250 mg/dL — AB
Hgb urine dipstick: NEGATIVE
Protein, ur: 30 mg/dL — AB
Urobilinogen, UA: 1 mg/dL (ref 0.0–1.0)

## 2013-09-03 LAB — URINE MICROSCOPIC-ADD ON

## 2013-09-03 MED ORDER — KETOROLAC TROMETHAMINE 30 MG/ML IJ SOLN
30.0000 mg | Freq: Once | INTRAMUSCULAR | Status: AC
  Administered 2013-09-03: 30 mg via INTRAVENOUS
  Filled 2013-09-03: qty 1

## 2013-09-03 MED ORDER — SODIUM CHLORIDE 0.9 % IV SOLN
Freq: Once | INTRAVENOUS | Status: AC
  Administered 2013-09-03: 17:00:00 via INTRAVENOUS

## 2013-09-03 MED ORDER — OXYCODONE-ACETAMINOPHEN 5-325 MG PO TABS
1.0000 | ORAL_TABLET | ORAL | Status: DC | PRN
  Administered 2013-09-03 – 2013-09-05 (×9): 1 via ORAL
  Filled 2013-09-03 (×9): qty 1

## 2013-09-03 MED ORDER — HYDROMORPHONE HCL PF 1 MG/ML IJ SOLN
1.0000 mg | Freq: Once | INTRAMUSCULAR | Status: AC
  Administered 2013-09-03: 1 mg via INTRAVENOUS
  Filled 2013-09-03: qty 1

## 2013-09-03 MED ORDER — HYDROMORPHONE HCL PF 1 MG/ML IJ SOLN
0.5000 mg | Freq: Once | INTRAMUSCULAR | Status: AC
  Administered 2013-09-03: 0.5 mg via INTRAVENOUS
  Filled 2013-09-03: qty 1

## 2013-09-03 MED ORDER — ESCITALOPRAM OXALATE 10 MG PO TABS
10.0000 mg | ORAL_TABLET | Freq: Every day | ORAL | Status: DC
Start: 2013-09-04 — End: 2013-09-05
  Administered 2013-09-04 – 2013-09-05 (×2): 10 mg via ORAL
  Filled 2013-09-03 (×2): qty 1

## 2013-09-03 MED ORDER — METHYLPREDNISOLONE SODIUM SUCC 125 MG IJ SOLR
125.0000 mg | Freq: Once | INTRAMUSCULAR | Status: AC
  Administered 2013-09-03: 125 mg via INTRAVENOUS
  Filled 2013-09-03: qty 2

## 2013-09-03 MED ORDER — IBUPROFEN 600 MG PO TABS
600.0000 mg | ORAL_TABLET | Freq: Three times a day (TID) | ORAL | Status: DC
  Administered 2013-09-03 – 2013-09-05 (×5): 600 mg via ORAL
  Filled 2013-09-03 (×7): qty 1

## 2013-09-03 MED ORDER — QUETIAPINE FUMARATE ER 300 MG PO TB24
300.0000 mg | ORAL_TABLET | Freq: Every day | ORAL | Status: DC
  Administered 2013-09-03 – 2013-09-04 (×2): 300 mg via ORAL
  Filled 2013-09-03 (×3): qty 1

## 2013-09-03 MED ORDER — INFLUENZA VAC SPLIT QUAD 0.5 ML IM SUSP
0.5000 mL | INTRAMUSCULAR | Status: AC
  Administered 2013-09-04: 0.5 mL via INTRAMUSCULAR
  Filled 2013-09-03: qty 0.5

## 2013-09-03 MED ORDER — HEPARIN SODIUM (PORCINE) 5000 UNIT/ML IJ SOLN
5000.0000 [IU] | Freq: Three times a day (TID) | INTRAMUSCULAR | Status: DC
  Administered 2013-09-03 – 2013-09-05 (×5): 5000 [IU] via SUBCUTANEOUS
  Filled 2013-09-03 (×8): qty 1

## 2013-09-03 MED ORDER — PREDNISONE 20 MG PO TABS
40.0000 mg | ORAL_TABLET | Freq: Every day | ORAL | Status: DC
  Administered 2013-09-04 – 2013-09-05 (×2): 40 mg via ORAL
  Filled 2013-09-03 (×3): qty 2

## 2013-09-03 NOTE — ED Notes (Signed)
Pt reports to the ED for eval of generalized body aches from a lupus flare up. Pt was treated a few weeks ago for similar. Pt reports she was given steriods and percocet and reports that helped her pain. Pt also has large knot to left posterior foot as well as several sores on her hands, buttocks, and elbows. Pt has hx of MRSA. Flare up started last pm and took OTC medications with minimal to no relief. Pt reports she has been moving lately and believes this exacerbated her Lupus. Pt moaning and cannot sit still at this time. Pt reporting 10/10 pain and "feels like she is on fire." Pt also reports bilateral hand swelling. Denies any known injury. Pt A&O x4.
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
## 2013-09-03 NOTE — ED Provider Notes (Signed)
42 year old female, history of lupus, history of pericarditis, presents with a complaint of diffuse polyarthralgias and swelling area and she has recently been treated with prednisone for lupus flareup but states that it has gotten worse in the last 2 days, on exam she has diffuse swelling of multiple joints of the hands, knees, ankles. She has tender indurated nodules as well which could be consistent with an erythema nodosum. She does have a leukocytosis on laboratory workup, a persistent tachycardia and will need to be admitted. She has been given prednisone, steroids but still has ongoing symptoms. I discussed this with the internal medicine resident who will admit.  Medical screening examination/treatment/procedure(s) were conducted as a shared visit with non-physician practitioner(s) and myself.  I personally evaluated the patient during the encounter.  Clinical Impression: Tachycardia, lupus flareup  Shelby Roller, MD 09/03/13 [phone]

## 2013-09-03 NOTE — ED Provider Notes (Signed)
CSN: [id number]     Arrival date & time 09/03/13  1524 History   First MD Initiated Contact with Patient 09/03/13 1524     Chief Complaint  Patient presents with  . Joint Pain   (Consider location/radiation/quality/duration/timing/severity/associated sxs/prior Treatment) HPI Comments: The patient with a history of Lupus presents with joint pain and joint swelling.  She reports the pain is similar to previous episodes of Lupus in the past.  She reports increase in pain since last night and took non-narcotic pain medication without relief. Reports pain 10/10 and is aggravated by movement.  She reports multiple skin lesions bilateral UE and bilateral LE, as well as her buttocks. Reports she has "popped" several lesions last night.Denies chest pain, abdominal pain, diarrhea, or constipation.  She states she was given steroids and Percocet with last ED visit 08/23/2013.  The history is provided by the patient.    Past Medical History  Diagnosis Date  . Depression   . Chronic pain   . Leukocytoclastic vasculitis   . Tobacco abuse   . Lupus   . Lupus   . Arthritis   . Anxiety   . GERD (gastroesophageal reflux disease)   . MRSA (methicillin resistant Staphylococcus aureus)   . Bipolar disorder 01/21/13   Past Surgical History  Procedure Laterality Date  . Cholecystectomy    . Tubal ligation    . Ankle surgery    . Hardware removal  09/25/2012    Procedure: HARDWARE REMOVAL;  Surgeon: Kerrin Champagne, MD;  Location: [facility] OR;  Service: Orthopedics;  Laterality: Left;  Incision, drainage and debridement of left lateral ankle, removal of plate and screws   Family History  Problem Relation Age of Onset  . Stroke Mother    History  Substance Use Topics  . Smoking status: Current Every Day Smoker -- 1.00 packs/day    Types: Cigarettes  . Smokeless tobacco: Never Used  . Alcohol Use: No   OB History   Grav Para Term Preterm Abortions TAB SAB Ect Mult Living                 Review of  Systems  All other systems reviewed and are negative.    Allergies  Review of patient's allergies indicates no known allergies.  Home Medications   Current Outpatient Rx  Name  Route  Sig  Dispense  Refill  . diphenhydramine-acetaminophen (TYLENOL PM) 25-500 MG TABS   Oral   Take 2 tablets by mouth at bedtime as needed (Pain/sleep aid).         Marland Kitchen escitalopram (LEXAPRO) 10 MG tablet   Oral   Take 1 tablet (10 mg total) by mouth daily.   30 tablet   0   . ibuprofen (ADVIL,MOTRIN) 200 MG tablet   Oral   Take 800 mg by mouth every 6 (six) hours as needed for pain.         Marland Kitchen QUEtiapine (SEROQUEL XR) 300 MG 24 hr tablet   Oral   Take 300 mg by mouth at bedtime.         . meloxicam (MOBIC) 7.5 MG tablet   Oral   Take 7.5 mg by mouth every morning.         Marland Kitchen oxyCODONE-acetaminophen (PERCOCET) 5-325 MG per tablet   Oral   Take 1 tablet by mouth every 4 (four) hours as needed for pain.   20 tablet   0   . predniSONE (DELTASONE) 20 MG tablet   Oral  Take 1 tablet (20 mg total) by mouth 2 (two) times daily.   10 tablet   0    BP 133/83  Pulse 104  Temp(Src) 97.9 F (36.6 C) (Oral)  Resp 23  Ht 5\' 3"  (1.6 m)  Wt 189 lb (85.73 kg)  BMI 33.49 kg/m2  SpO2 96%  LMP 08/19/2013 Physical Exam  Nursing note and vitals reviewed. Constitutional: She appears well-developed and well-nourished.  Patient appears uncomfortable, and is rocking back and forth on bed during exam.  HENT:  Head: Normocephalic and atraumatic.  Eyes: EOM are normal.  Neck: Neck supple.  Cardiovascular: Regular rhythm and normal heart sounds.  Tachycardia present.   Pulmonary/Chest: Effort normal and breath sounds normal. No respiratory distress. She has no wheezes. She has no rales. She exhibits no tenderness.  Abdominal: Soft. Bowel sounds are normal. There is no tenderness. There is no rebound and no guarding.  Musculoskeletal: Normal range of motion.  Patient with swollen and tender MCP  bilateral ankles and bilateral elbows. 3 x 3 area of swelling and tender to palpation of the Left plantar surface of foot, medial to plantar aponeurosis.   Neurological: She is alert.  Skin: Skin is warm.  Multiple skin lesions in various healing states on hands, elbows, and LE.  Increase in temperature, and swelling.  Psychiatric: Her mood appears anxious.    ED Course  Procedures (including critical care time) Labs Review Labs Reviewed  CBC - Abnormal; Notable for the following:    WBC 14.2 (*)    All other components within normal limits  COMPREHENSIVE METABOLIC PANEL - Abnormal; Notable for the following:    Glucose, Bld 101 (*)    All other components within normal limits  URINALYSIS, ROUTINE W REFLEX MICROSCOPIC   Imaging Review Dg Chest 2 View  09/03/2013   CLINICAL DATA:  Shortness of breath.  EXAM: CHEST  2 VIEW  COMPARISON:  Chest x-ray 03/12/2011.  FINDINGS: Large bulla in the medial aspect of the right upper lobe is similar to prior studies. Linear opacity in the region of the lingula may reflect mild subsegmental atelectasis or scarring. No acute consolidative airspace disease. No pleural effusions. Mild diffuse peribronchial cuffing. No evidence of pulmonary edema. Heart size is normal. Upper mediastinal contours are within normal limits. Atherosclerosis in the thoracic aorta.  IMPRESSION: 1. Mild diffuse peribronchial cuffing which may indicate acute or chronic bronchitis. Clinical correlation is recommended. 2. Large bulla in the medial aspect of the right upper lobe. 3. Atherosclerosis.   Electronically Signed   By: Trudie Reed M.D.   On: 09/03/2013 17:38    EKG Interpretation   None       MDM   1. Polyarthralgia   2. Leukocytosis   3. Tachycardia   4. Anxiety and depression   5. Bipolar disorder   6. Lupus arthritis    Patient with a past medical history of Lupus presents with joint pain and swelling since last night, patient appears uncomfortable during  exam.  Patient is asking for pain medication at this time.   Discussed patient history and PE with Dr. Hyacinth Meeker, plan manage patient's pain in the ED for discharge.  Multiple doses of Dilaudid given and patient requesting more pain medicaiton, Patient still appears uncomfortable and is rocking in bed.  Discussed patient with Dr. Hyacinth Meeker and will consult hospitalitis for possible admission.   Dr. Hyacinth Meeker consulted Dr. Delane Ginger and will admit the patient.  Meds given in ED:  Medications  oxyCODONE-acetaminophen (PERCOCET/ROXICET)  5-325 MG per tablet 1 tablet (1 tablet Oral Given 09/04/13 1753)  escitalopram (LEXAPRO) tablet 10 mg (10 mg Oral Given 09/04/13 1016)  QUEtiapine (SEROQUEL XR) 24 hr tablet 300 mg (300 mg Oral Given 09/03/13 2158)  ibuprofen (ADVIL,MOTRIN) tablet 600 mg (600 mg Oral Given 09/04/13 1628)  predniSONE (DELTASONE) tablet 40 mg (40 mg Oral Given 09/04/13 0803)  heparin injection 5,000 Units (5,000 Units Subcutaneous Given 09/04/13 1415)  pantoprazole (PROTONIX) EC tablet 40 mg (not administered)  HYDROmorphone (DILAUDID) injection 0.5 mg (0.5 mg Intravenous Given 09/03/13 1622)  methylPREDNISolone sodium succinate (SOLU-MEDROL) 125 mg/2 mL injection 125 mg (125 mg Intravenous Given 09/03/13 1653)  0.9 %  sodium chloride infusion ( Intravenous New Bag/Given 09/03/13 1653)  HYDROmorphone (DILAUDID) injection 1 mg (1 mg Intravenous Given 09/03/13 1653)  ketorolac (TORADOL) 30 MG/ML injection 30 mg (30 mg Intravenous Given 09/03/13 1815)  HYDROmorphone (DILAUDID) injection 0.5 mg (0.5 mg Intravenous Given 09/03/13 1945)  influenza vac split quadrivalent PF (FLUARIX) injection 0.5 mL (0.5 mLs Intramuscular Given 09/04/13 1016)  famotidine (PEPCID) tablet 20 mg (20 mg Oral Given 09/04/13 1753)      Clabe Seal, PA-C 09/04/13 1944

## 2013-09-04 DIAGNOSIS — F319 Bipolar disorder, unspecified: Secondary | ICD-10-CM

## 2013-09-04 DIAGNOSIS — M310 Hypersensitivity angiitis: Secondary | ICD-10-CM

## 2013-09-04 DIAGNOSIS — M329 Systemic lupus erythematosus, unspecified: Secondary | ICD-10-CM

## 2013-09-04 LAB — BASIC METABOLIC PANEL WITH GFR
BUN: 12 mg/dL (ref 6–23)
CO2: 19 meq/L (ref 19–32)
Calcium: 8.8 mg/dL (ref 8.4–10.5)
Chloride: 103 meq/L (ref 96–112)
Creatinine, Ser: 0.53 mg/dL (ref 0.50–1.10)
GFR calc Af Amer: >90 mL/min
GFR calc non Af Amer: >90 mL/min
Glucose, Bld: 181 mg/dL — ABNORMAL HIGH (ref 70–99)
Potassium: 4.7 meq/L (ref 3.5–5.1)
Sodium: 134 meq/L — ABNORMAL LOW (ref 135–145)

## 2013-09-04 LAB — CBC
HCT: 36.8 % (ref 36.0–46.0)
MCH: 29.7 pg (ref 26.0–34.0)
MCHC: 35.1 g/dL (ref 30.0–36.0)
MCV: 84.8 fL (ref 78.0–100.0)
Platelets: 257 10*3/uL (ref 150–400)
RDW: 14.3 % (ref 11.5–15.5)

## 2013-09-04 LAB — HIV ANTIBODY (ROUTINE TESTING W REFLEX): HIV: NONREACTIVE

## 2013-09-04 LAB — C4 COMPLEMENT: Complement C4, Body Fluid: 16 mg/dL (ref 10–40)

## 2013-09-04 LAB — C3 COMPLEMENT: C3 Complement: 137 mg/dL (ref 90–180)

## 2013-09-04 MED ORDER — FAMOTIDINE 40 MG PO TABS: 40.0000 mg | ORAL_TABLET | Freq: Every day | ORAL | Status: DC

## 2013-09-04 MED ORDER — FAMOTIDINE 20 MG PO TABS
20.0000 mg | ORAL_TABLET | Freq: Once | ORAL | Status: AC
  Administered 2013-09-04: 20 mg via ORAL
  Filled 2013-09-04 (×2): qty 1

## 2013-09-04 MED ORDER — PANTOPRAZOLE SODIUM 40 MG PO TBEC
40.0000 mg | DELAYED_RELEASE_TABLET | Freq: Every day | ORAL | Status: DC
  Administered 2013-09-05: 40 mg via ORAL

## 2013-09-04 MED ORDER — PANTOPRAZOLE SODIUM 40 MG IV SOLR
40.0000 mg | INTRAVENOUS | Status: DC
  Administered 2013-09-04: 40 mg via INTRAVENOUS
  Filled 2013-09-04 (×2): qty 40

## 2013-09-04 NOTE — Progress Notes (Signed)
Patient c/o heartburn.Dr. Johna Roles notified,order received.Will continue to monitor. Johnelle Tafolla Joselita,RN

## 2013-09-04 NOTE — Progress Notes (Signed)
Subjective:  Pt reports she was seen in the ED 2 weeks ago in for worsening joint pains in her hands and knees/ankles. She was given a 5 day course of prednisone and percocet for pain which she completed. She improved with this treatment but then had another "flare" and returned to the ED on 10/16 with worsening pain and rash on her LE and buttocks.  She is very anxious and worried because she feels that she may have throat cancer from smoking because her voice has been hoarse.   Objective: Vital signs in last 24 hours: Filed Vitals:   09/03/13 1930 09/03/13 1945 09/03/13 2112 09/04/13 0531  BP: 136/88 146/97 134/99 165/99  Pulse: 105 106 109 93  Temp:   98.4 F (36.9 C) 97.2 F (36.2 C)  TempSrc:   Oral Oral  Resp:   18 20  Height:      Weight:   89.858 kg (198 lb 1.6 oz)   SpO2: 100% 99% 97% 97%   Weight change:   Intake/Output Summary (Last 24 hours) at 09/04/13 0841 Last data filed at 09/04/13 0800  Gross per 24 hour  Intake    960 ml  Output   1000 ml  Net    -40 ml    Constitutional: Vital signs reviewed.  Patient is a well-developed and well-nourished female anxious and tearful during exam.   Head: Normocephalic and atraumatic Mouth: no erythema or exudates, MMM. Pt has poor dentition.  Eyes: PERRL, EOMI, conjunctivae normal, No scleral icterus.  Neck: Supple, Trachea midline. Cardiovascular: RRR, S1 normal, S2 normal, no MRG, pulses symmetric and intact bilaterally Pulmonary/Chest: normal respiratory effort, CTAB, no wheezes, rales, or rhonchi Abdominal: Soft. Non-tender, non-distended, bowel sounds are normal, no masses, organomegaly, or guarding present.  Musculoskeletal: Multiple joint deformities especially MCP; no erythema. Neurological: A&O x3, cranial nerve II-XII are grossly intact, no focal motor deficit.  Skin: Warm, dry and intact. Pt has macular rash on b/l LE and also on buttocks with excoriations.   Psychiatric: Pt is very anxious and tearful.    Lab Results: Basic Metabolic Panel:  Recent Labs Lab 09/03/13 1625 09/04/13 0607  NA 135 134*  K 4.1 4.7  CL 100 103  CO2 21 19  GLUCOSE 101* 181*  BUN 13 12  CREATININE 0.72 0.53  CALCIUM 9.1 8.8   Liver Function Tests:  Recent Labs Lab 09/03/13 1625  AST 13  ALT 12  ALKPHOS 92  BILITOT 0.3  PROT 7.4  ALBUMIN 3.7   No results found for this basename: LIPASE, AMYLASE,  in the last 168 hours No results found for this basename: AMMONIA,  in the last 168 hours CBC:  Recent Labs Lab 09/03/13 1625 09/04/13 0607  WBC 14.2* 13.5*  HGB 13.5 12.9  HCT 39.4 36.8  MCV 84.4 84.8  PLT 285 257   Cardiac Enzymes: No results found for this basename: CKTOTAL, CKMB, CKMBINDEX, TROPONINI,  in the last 168 hours BNP: No results found for this basename: PROBNP,  in the last 168 hours D-Dimer: No results found for this basename: DDIMER,  in the last 168 hours CBG: No results found for this basename: GLUCAP,  in the last 168 hours Hemoglobin A1C: No results found for this basename: HGBA1C,  in the last 168 hours Fasting Lipid Panel: No results found for this basename: CHOL, HDL, LDLCALC, TRIG, CHOLHDL, LDLDIRECT,  in the last 168 hours Thyroid Function Tests: No results found for this basename: TSH, T4TOTAL, FREET4, T3FREE, THYROIDAB,  in the last 168 hours Coagulation: No results found for this basename: LABPROT, INR,  in the last 168 hours Anemia Panel: No results found for this basename: VITAMINB12, FOLATE, FERRITIN, TIBC, IRON, RETICCTPCT,  in the last 168 hours Urine Drug Screen: Drugs of Abuse     Component Value Date/Time   LABOPIA POSITIVE* 09/03/2013 2210   COCAINSCRNUR NONE DETECTED 09/03/2013 2210   LABBENZ POSITIVE* 09/03/2013 2210   AMPHETMU POSITIVE* 09/03/2013 2210   THCU NONE DETECTED 09/03/2013 2210   LABBARB NONE DETECTED 09/03/2013 2210    Alcohol Level: No results found for this basename: ETH,  in the last 168 hours Urinalysis:  Recent  Labs Lab 09/03/13 2210  COLORURINE AMBER*  LABSPEC 1.043*  PHURINE 5.5  GLUCOSEU 250*  HGBUR NEGATIVE  BILIRUBINUR SMALL*  KETONESUR 15*  PROTEINUR 30*  UROBILINOGEN 1.0  NITRITE NEGATIVE  LEUKOCYTESUR TRACE*   Misc. Labs:   Micro Results: Recent Results (from the past 240 hour(s))  MRSA PCR SCREENING     Status: None   Collection Time    09/03/13  9:22 PM      Result Value Range Status   MRSA by PCR NEGATIVE  NEGATIVE Final   Comment:            The GeneXpert MRSA Assay (FDA     approved for NASAL specimens     only), is one component of a     comprehensive MRSA colonization     surveillance program. It is not     intended to diagnose MRSA     infection nor to guide or     monitor treatment for     MRSA infections.   Studies/Results: Dg Chest 2 View  09/03/2013   CLINICAL DATA:  Shortness of breath.  EXAM: CHEST  2 VIEW  COMPARISON:  Chest x-ray 03/12/2011.  FINDINGS: Large bulla in the medial aspect of the right upper lobe is similar to prior studies. Linear opacity in the region of the lingula may reflect mild subsegmental atelectasis or scarring. No acute consolidative airspace disease. No pleural effusions. Mild diffuse peribronchial cuffing. No evidence of pulmonary edema. Heart size is normal. Upper mediastinal contours are within normal limits. Atherosclerosis in the thoracic aorta.  IMPRESSION: 1. Mild diffuse peribronchial cuffing which may indicate acute or chronic bronchitis. Clinical correlation is recommended. 2. Large bulla in the medial aspect of the right upper lobe. 3. Atherosclerosis.   Electronically Signed   By: Trudie Reed M.D.   On: 09/03/2013 17:38   Medications: I have reviewed the patient's current medications. Scheduled Meds: . escitalopram  10 mg Oral Daily  . heparin  5,000 Units Subcutaneous Q8H  . ibuprofen  600 mg Oral TID  . influenza vac split quadrivalent PF  0.5 mL Intramuscular Tomorrow-1000  . [START ON 09/05/2013] pantoprazole   40 mg Oral Daily  . predniSONE  40 mg Oral Q breakfast  . 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 Patient with reported SLE diagnosed 6 years ago however no records of dx per epic. She had positive ANA in 2010 (titer 1:40) and mildly elevated ESR (32) with low complement levels in 2012. Most recently (12/2012) with negative ANA, normal complement levels, normal ESR, and elevated CRP. Also with negative RF and positive anca (MPO and PR3) in 2013. No records  of anti-ds DNA or anti-smith antibodies. She has been treated with corticosteroids and NSAIDS for flare-ups. No history of plaquenil use. No history of lupus nephritis but with proteinuria since 2013. History of normocytic anemia in 2012. No lymphopenia, thrombocytopenia, or thrombophilia (unknown if (+) lupus anticoagulant, anticardiolipin, anti Beta-2 GP). Will need further follow-up with rheumatology for further work-up once she is insured.  -Prednisone 40 mg (s/p solumedrol in ED)  -Advil 600 mg TID pain  -Percocet/roxicet PRN pain  -Obtain anti-DS DNA, anti-smith, C3/C4 levels  -Obtain HIV Ab titers  -Obtain UDS - (+) Amphetamines, benzodiazepines, opiates  -Obtain UA - mild proteinuria, ketonuria, no hematuria  -CBC in AM  -BMP in AM  09/04/13: -continue prednisone 40mg  daily -consulted rheumatology but will not see as inpatient  # Maculopapular Rash - Worse on lower extremities and buttocks. 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 No h/o cryoglobulinemia or hepatitis C infection 09/04/13:  -continue prednisone 40 mg   #Leukocystosis - no fever, erythema or warmth to suggest septic joint, most likely due to recent steroid use  #Anxiety/Depression/Bipoloar disorder - Patient  with anxiety and unstable. Pt sees mental health for psych meds.  -Continue home seroquel  -Continue home lexapro  09/04/13: -?consider psych consult  #GERD - not on acid suppression medications at home per med list  -Protonix   Ppx: SQ Heparin    Dispo: Disposition is deferred at this time, awaiting improvement of current medical problems.  Anticipated discharge in approximately 1-2 day(s).   The patient does have a current PCP (Ky Barban, MD) and does need an [facility] hospital follow-up appointment after discharge.    .Services Needed at time of discharge: Y = Yes, Blank = No PT:   OT:   RN:   Equipment:   Other:     LOS: 1 day   Boykin Peek, MD 09/04/2013, 8:41 AM

## 2013-09-04 NOTE — Progress Notes (Signed)
  Date: 09/04/2013  Patient name: [name]  Medical record number: [id number]  Date of birth: October 12, 1971   I have seen and evaluated [name] and discussed their care with the Residency Team.   Assessment and Plan: I have seen and evaluated the patient as outlined above. 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  She was seen in ED 2 weeks ago in for worsening joint pain. She was given a rx for percocet and prednisone (5 days). She had transient improvement and then returns to ED on 10-16 with worsening pain. She has also noted a skin rash on her extremities and buttocks.  She has not been able to see Rheum.  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 She attends weekly counseling as well as drug sessions.   On exam she is easily tearful, is worried that her horse voice is indicative that she has cancer or COPD.  Her lung exam shows mild decreased breath sounds B. Her skin exam shows multiple small skin lesions on her extr, larger lesions (non-blistering) on her buttocks.    UDS: amphetamines, BZD, opiates.  WBC 13.5 CXR: 1. Mild diffuse peribronchial cuffing which may indicate acute or chronic bronchitis. Clinical correlation is recommended. 2. Large bulla in the medial aspect of the right upper lobe.  3. Atherosclerosis.   SLE Bipolar Leukocytoclastic vasculitis  Would Steroid blast Pain mgmt D/i rheum Repeat SLE labs pending.  HIV, hep panel (-) Consider psych eval Smoking cessation, smoking cessation, smoking cessation  Comment Her psychiatric issues seem to be driving at this point. The lesions on her buttocks appear excoriated.    Ginnie Smart, MD 10/17/20142:08 PM

## 2013-09-04 NOTE — H&P (Signed)
Pt seen with h/o. Please see my note as well.

## 2013-09-05 ENCOUNTER — Other Ambulatory Visit: Payer: Self-pay | Admitting: Internal Medicine

## 2013-09-05 DIAGNOSIS — R490 Dysphonia: Secondary | ICD-10-CM | POA: Diagnosis present

## 2013-09-05 MED ORDER — IBUPROFEN 200 MG PO TABS: 600.0000 mg | ORAL_TABLET | Freq: Three times a day (TID) | ORAL | Status: DC | PRN

## 2013-09-05 MED ORDER — OXYCODONE-ACETAMINOPHEN 5-325 MG PO TABS: 1.0000 | ORAL_TABLET | ORAL | Status: DC | PRN

## 2013-09-05 MED ORDER — OMEPRAZOLE 20 MG PO CPDR: 20.0000 mg | DELAYED_RELEASE_CAPSULE | Freq: Every day | ORAL | Status: DC

## 2013-09-05 MED ORDER — PREDNISONE 20 MG PO TABS: 20.0000 mg | ORAL_TABLET | Freq: Two times a day (BID) | ORAL | Status: DC

## 2013-09-05 NOTE — Progress Notes (Addendum)
Subjective: No acute events overnight, states that she is ready to go home this morning and requesting bus pass, denies arthralgias today but states concern about "knots all over". Would like to see "ENT" and psych as outpatient.    Objective: Vital signs in last 24 hours: Filed Vitals:   09/04/13 1322 09/04/13 1623 09/04/13 2024 09/05/13 0539  BP: 128/87 130/91 137/83 125/80  Pulse: 102 102 100 85  Temp: 98.4 F (36.9 C) 97.8 F (36.6 C) 97.9 F (36.6 C) 97.8 F (36.6 C)  TempSrc:   Oral Oral  Resp: 22 21 20 18   Height:      Weight:   199 lb 3.2 oz (90.357 kg)   SpO2: 98% 97% 97% 98%   Weight change: 10 lb 3.2 oz (4.627 kg)  Intake/Output Summary (Last 24 hours) at 09/05/13 0951 Last data filed at 09/05/13 0700  Gross per 24 hour  Intake    720 ml  Output      0 ml  Net    720 ml    General: Well-developed, well-nourished, White female standing in the door way of room, in no acute distress; Head: Normocephalic, atraumatic. Eyes: PERRLA, EOMI, No signs of anemia or jaundice. Throat: Oropharynx nonerythematous, no exudate appreciated.  Neck: supple Lungs: Normal respiratory effort. Distant breath sounds, Clear to auscultation bilaterally from apices to bases without crackles or wheezes appreciated. Heart: normal rate, regular rhythm, normal S1 and S2, no gallop, murmur, or rubs appreciated. Abdomen: BS normoactive. Soft, Nondistended, non-tender. No masses or organomegaly appreciated. Extremities: No pretibial edema, distal pulses intact, multiple excoriations bilateral lower extremities Neurologic: grossly non-focal, alert and oriented x3, appropriate and cooperative throughout examination.    Lab Results: Basic Metabolic Panel:  Recent Labs Lab 09/03/13 1625 09/04/13 0607  NA 135 134*  K 4.1 4.7  CL 100 103  CO2 21 19  GLUCOSE 101* 181*  BUN 13 12  CREATININE 0.72 0.53  CALCIUM 9.1 8.8   Liver Function Tests:  Recent Labs Lab 09/03/13 1625  AST  13  ALT 12  ALKPHOS 92  BILITOT 0.3  PROT 7.4  ALBUMIN 3.7   CBC:  Recent Labs Lab 09/03/13 1625 09/04/13 0607  WBC 14.2* 13.5*  HGB 13.5 12.9  HCT 39.4 36.8  MCV 84.4 84.8  PLT 285 257   Urine Drug Screen: Drugs of Abuse     Component Value Date/Time   LABOPIA POSITIVE* 09/03/2013 2210   COCAINSCRNUR NONE DETECTED 09/03/2013 2210   LABBENZ POSITIVE* 09/03/2013 2210   AMPHETMU POSITIVE* 09/03/2013 2210   THCU NONE DETECTED 09/03/2013 2210   LABBARB NONE DETECTED 09/03/2013 2210    Urinalysis:  Recent Labs Lab 09/03/13 2210  COLORURINE AMBER*  LABSPEC 1.043*  PHURINE 5.5  GLUCOSEU 250*  HGBUR NEGATIVE  BILIRUBINUR SMALL*  KETONESUR 15*  PROTEINUR 30*  UROBILINOGEN 1.0  NITRITE NEGATIVE  LEUKOCYTESUR TRACE*    Micro Results: Recent Results (from the past 240 hour(s))  MRSA PCR SCREENING     Status: None   Collection Time    09/03/13  9:22 PM      Result Value Range Status   MRSA by PCR NEGATIVE  NEGATIVE Final   Comment:            The GeneXpert MRSA Assay (FDA     approved for NASAL specimens     only), is one component of a     comprehensive MRSA colonization     surveillance program. It is not  intended to diagnose MRSA     infection nor to guide or     monitor treatment for     MRSA infections.   Studies/Results: Dg Chest 2 View  09/03/2013   CLINICAL DATA:  Shortness of breath.  EXAM: CHEST  2 VIEW  COMPARISON:  Chest x-ray 03/12/2011.  FINDINGS: Large bulla in the medial aspect of the right upper lobe is similar to prior studies. Linear opacity in the region of the lingula may reflect mild subsegmental atelectasis or scarring. No acute consolidative airspace disease. No pleural effusions. Mild diffuse peribronchial cuffing. No evidence of pulmonary edema. Heart size is normal. Upper mediastinal contours are within normal limits. Atherosclerosis in the thoracic aorta.  IMPRESSION: 1. Mild diffuse peribronchial cuffing which may indicate acute  or chronic bronchitis. Clinical correlation is recommended. 2. Large bulla in the medial aspect of the right upper lobe. 3. Atherosclerosis.   Electronically Signed   By: Trudie Reed M.D.   On: 09/03/2013 17:38   Medications: I have reviewed the patient's current medications. Scheduled Meds: . escitalopram  10 mg Oral Daily  . heparin  5,000 Units Subcutaneous Q8H  . ibuprofen  600 mg Oral TID  . pantoprazole  40 mg Oral Daily  . predniSONE  40 mg Oral Q breakfast  . QUEtiapine  300 mg Oral QHS   Continuous Infusions:  PRN Meds:.oxyCODONE-acetaminophen  Assessment/Plan: HD # 2 for 42 yo White female with SLE, bipolar d/o and leukocytoclastic vasculitis admitted with worsening polyarthralgias  #1 Polyarthralgia due to Systemic Lupus Erythematosus - pain improved overnight and currently without complaints.  Requesting to go home.  HIV neg, anti-DNA pending, anti-Smith pending, C3 137 (wnl), C4 (wnl), mild proteinuria, ketonuria, no hematuria    -Will need further follow-up with rheumatology for further work-up once she is insured. This will be arranged in [facility]. 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 No h/o cryoglobulinemia or hepatitis C infection  -cont prednisone    #3 Leukocystosis - trending downwards, no fever, erythema or warmth to suggest septic joint, most likely due to recent steroid use   Recent Labs Lab 09/03/13 1625 09/04/13 0607  WBC 14.2* 13.5*    #4 Anxiety/Depression/Bipoloar disorder - stable mood today, with appropriate affect, no tearfulness, Pt sees mental health for psych meds. Declines inpatient psych assessment -Continue home seroquel    -Continue home lexapro   #5 Hoarseness: this has been chronic for years per patient reports, she stopped smoking crack cocaine 5 months again but continues to smoke cigarettes.  Has been started on PPI as inpatient. -pt reports that she will re-apply for [facility] -ENT referral as out pt -cont PPI -cont smoking cessation education -cont cocaine counseling as outpt  #6 GERD: stable on PPI -will start on omeprazole which can be purchased OTC as outpt  DVT ppx: SQ Heparin   Dispo: Discharge home today with f/u in [facility]. Will contact [employer] for assistance.  The patient does have a current PCP (Ky Barban, MD) and does need an [facility] hospital follow-up appointment after discharge.  The patient does have transportation limitations that hinder transportation to clinic appointments.  .Services Needed at time of discharge: Y = Yes,  Blank = No PT:   OT:   RN:   Equipment:   Other:     LOS: 2 days   Manuela Schwartz, MD 09/05/2013, 9:51 AM
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
## 2013-09-05 NOTE — Progress Notes (Signed)
Pt discharged to home after visit summary reviewed and pt capable of re verbalizing medications and follow up appointments. Pt remains stable. No signs and symptoms of distress. Educated to return to ER in the event of SOB, dizziness, chest pain, or fainting. Fayola Meckes, RN   

## 2013-09-07 LAB — ANTI-SMITH ANTIBODY: ENA SM Ab Ser-aCnc: 3 AU/mL (ref <30)

## 2013-09-07 NOTE — ED Provider Notes (Signed)
Medical screening examination/treatment/procedure(s) were conducted as a shared visit with non-physician practitioner(s) and myself.  I personally evaluated the patient during the encounter  Please see my separate respective documentation pertaining to this patient encounter   Meoshia Billing D Cortlynn Hollinsworth, MD 09/07/13 0704 

## 2013-09-20 ENCOUNTER — Emergency Department (HOSPITAL_COMMUNITY)
Admission: EM | Admit: 2013-09-20 | Discharge: 2013-09-20 | Disposition: A | Discharge status: Home / Self Care | Payer: Medicaid Other | Attending: Emergency Medicine | Admitting: Emergency Medicine

## 2013-09-20 ENCOUNTER — Encounter (HOSPITAL_COMMUNITY): Payer: Self-pay | Admitting: Emergency Medicine

## 2013-09-20 DIAGNOSIS — K219 Gastro-esophageal reflux disease without esophagitis: Secondary | ICD-10-CM | POA: Insufficient documentation

## 2013-09-20 DIAGNOSIS — Z8614 Personal history of Methicillin resistant Staphylococcus aureus infection: Secondary | ICD-10-CM | POA: Insufficient documentation

## 2013-09-20 DIAGNOSIS — G8929 Other chronic pain: Secondary | ICD-10-CM | POA: Insufficient documentation

## 2013-09-20 DIAGNOSIS — M129 Arthropathy, unspecified: Secondary | ICD-10-CM | POA: Insufficient documentation

## 2013-09-20 DIAGNOSIS — F319 Bipolar disorder, unspecified: Secondary | ICD-10-CM | POA: Insufficient documentation

## 2013-09-20 DIAGNOSIS — Z79899 Other long term (current) drug therapy: Secondary | ICD-10-CM | POA: Insufficient documentation

## 2013-09-20 DIAGNOSIS — M329 Systemic lupus erythematosus, unspecified: Secondary | ICD-10-CM | POA: Insufficient documentation

## 2013-09-20 DIAGNOSIS — K029 Dental caries, unspecified: Secondary | ICD-10-CM | POA: Insufficient documentation

## 2013-09-20 DIAGNOSIS — F17200 Nicotine dependence, unspecified, uncomplicated: Secondary | ICD-10-CM | POA: Insufficient documentation

## 2013-09-20 DIAGNOSIS — F411 Generalized anxiety disorder: Secondary | ICD-10-CM | POA: Insufficient documentation

## 2013-09-20 MED ORDER — HYDROCODONE-ACETAMINOPHEN 5-325 MG PO TABS
1.0000 | ORAL_TABLET | Freq: Once | ORAL | Status: AC
  Administered 2013-09-20: 1 via ORAL
  Filled 2013-09-20: qty 1

## 2013-09-20 MED ORDER — HYDROCODONE-ACETAMINOPHEN 5-325 MG PO TABS: 1.0000 | ORAL_TABLET | ORAL | Status: DC | PRN

## 2013-09-20 MED ORDER — PENICILLIN V POTASSIUM 250 MG PO TABS
500.0000 mg | ORAL_TABLET | Freq: Once | ORAL | Status: AC
  Administered 2013-09-20: 500 mg via ORAL
  Filled 2013-09-20: qty 2

## 2013-09-20 MED ORDER — PENICILLIN V POTASSIUM 500 MG PO TABS: 500.0000 mg | ORAL_TABLET | Freq: Three times a day (TID) | ORAL | Status: DC

## 2013-09-20 NOTE — ED Provider Notes (Signed)
CSN: [id number]     Arrival date & time 09/20/13  1015 History   This chart was scribed for non-physician practitioner Elpidio Anis, PA-C, working with Enid Skeens, MD, by Yevette Edwards, ED Scribe. This patient was seen in room TR07C/TR07C and the patient's care was started at 10:42 AM.  First MD Initiated Contact with Patient 09/20/13 1025     Chief Complaint  Patient presents with  . Dental Pain    The history is provided by the patient. No language interpreter was used.   HPI Comments: [name] is a 42 y.o. female, with a h/o lupus, who presents to the Emergency Department complaining of gradually-increasing and persistent dental pain to an upper tooth on her right side which began yesterday. She has treated the pain with 800 mg of IBU, but without resolution. She reports a h/o issues with the affected tooth, and she is waiting for Medicaid so that she can have the tooth removed.  The pt denies any facial swelling or fever. She is a smoker; she smokes a pack a day.   Past Medical History  Diagnosis Date  . Depression   . Chronic pain   . Leukocytoclastic vasculitis   . Tobacco abuse   . Lupus   . Lupus   . Arthritis   . Anxiety   . GERD (gastroesophageal reflux disease)   . MRSA (methicillin resistant Staphylococcus aureus)   . Bipolar disorder 01/21/13   Past Surgical History  Procedure Laterality Date  . Cholecystectomy    . Tubal ligation    . Ankle surgery    . Hardware removal  09/25/2012    Procedure: HARDWARE REMOVAL;  Surgeon: Kerrin Champagne, MD;  [facility];  Service: Orthopedics;  Laterality: Left;  Incision, drainage and debridement of left lateral ankle, removal of plate and screws   Family History  Problem Relation Age of Onset  . Stroke Mother    History  Substance Use Topics  . Smoking status: Current Every Day Smoker -- 1.00 packs/day    Types: Cigarettes  . Smokeless tobacco: Never Used  . Alcohol Use: No   No OB history provided.  Review  of Systems  Constitutional: Negative for fever.  HENT: Positive for dental problem. Negative for facial swelling.     Allergies  Review of patient's allergies indicates no known allergies.  Home Medications   Current Outpatient Rx  Name  Route  Sig  Dispense  Refill  . diphenhydramine-acetaminophen (TYLENOL PM) 25-500 MG TABS   Oral   Take 2 tablets by mouth at bedtime as needed (Pain/sleep aid).         Marland Kitchen escitalopram (LEXAPRO) 10 MG tablet   Oral   Take 1 tablet (10 mg total) by mouth daily.   30 tablet   0   . ibuprofen (ADVIL,MOTRIN) 200 MG tablet   Oral   Take 3 tablets (600 mg total) by mouth every 8 (eight) hours as needed for pain.   30 tablet   0   . omeprazole (PRILOSEC) 20 MG capsule   Oral   Take 1 capsule (20 mg total) by mouth daily.   30 capsule   3   . oxyCODONE-acetaminophen (PERCOCET) 5-325 MG per tablet   Oral   Take 1 tablet by mouth every 4 (four) hours as needed for pain.   30 tablet   0   . predniSONE (DELTASONE) 20 MG tablet   Oral   Take 1 tablet (20 mg  total) by mouth 2 (two) times daily.   14 tablet   0   . QUEtiapine (SEROQUEL XR) 300 MG 24 hr tablet   Oral   Take 300 mg by mouth at bedtime.          Triage Vitals: BP 111/74  Pulse 100  Temp(Src) 98 F (36.7 C) (Oral)  Resp 16  Ht 5\' 3"  (1.6 m)  Wt 195 lb (88.451 kg)  BMI 34.55 kg/m2  SpO2 98%  LMP 08/19/2013  Physical Exam  Nursing note and vitals reviewed. Constitutional: She is oriented to person, place, and time. She appears well-developed and well-nourished. No distress.  HENT:  Head: Normocephalic and atraumatic.  Mouth/Throat: Oropharynx is clear and moist. No oropharyngeal exudate.  Generally poor dentition with marked decay of number 29 and 30.  No pointing abscesses. No facial swelling. No adenopathy. Oropharyxn benign.   Eyes: EOM are normal.  Neck: Neck supple. No tracheal deviation present.  Cardiovascular: Normal rate.   Pulmonary/Chest: Effort  normal. No respiratory distress.  Musculoskeletal: Normal range of motion.  Lymphadenopathy:    She has no cervical adenopathy.  Neurological: She is alert and oriented to person, place, and time.  Skin: Skin is warm and dry.  Psychiatric: She has a normal mood and affect. Her behavior is normal.    ED Course  Procedures (including critical care time)  DIAGNOSTIC STUDIES: Oxygen Saturation is 98% on room air, normal by my interpretation.    COORDINATION OF CARE:  10:46 AM- Discussed treatment plan with patient which includes , and the patient agreed to the plan.   Labs Review Labs Reviewed - No data to display Imaging Review No results found.  EKG Interpretation   None       MDM  No diagnosis found. 1. Dental caries  Uncomplicated dental caries.  I personally performed the services described in this documentation, which was scribed in my presence. The recorded information has been reviewed and is accurate.     Arnoldo Hooker, PA-C 09/20/13 1105

## 2013-09-20 NOTE — ED Provider Notes (Signed)
Medical screening examination/treatment/procedure(s) were performed by non-physician practitioner and as supervising physician I was immediately available for consultation/collaboration.  EKG Interpretation   None         Enid Skeens, MD 09/20/13 1759

## 2013-09-20 NOTE — ED Notes (Signed)
Pt reports a toothache upper R back since yesterday. Took ibuprofen with no relief

## 2013-10-05 ENCOUNTER — Encounter: Payer: Self-pay | Admitting: Internal Medicine

## 2013-10-05 ENCOUNTER — Ambulatory Visit (INDEPENDENT_AMBULATORY_CARE_PROVIDER_SITE_OTHER): Payer: Self-pay | Admitting: Internal Medicine

## 2013-10-05 VITALS — BP 128/89 | HR 108 | Temp 97.0°F | Wt 201.1 lb

## 2013-10-05 DIAGNOSIS — R0981 Nasal congestion: Secondary | ICD-10-CM

## 2013-10-05 DIAGNOSIS — F329 Major depressive disorder, single episode, unspecified: Secondary | ICD-10-CM

## 2013-10-05 DIAGNOSIS — F1910 Other psychoactive substance abuse, uncomplicated: Secondary | ICD-10-CM

## 2013-10-05 DIAGNOSIS — E669 Obesity, unspecified: Secondary | ICD-10-CM

## 2013-10-05 DIAGNOSIS — F17200 Nicotine dependence, unspecified, uncomplicated: Secondary | ICD-10-CM | POA: Insufficient documentation

## 2013-10-05 DIAGNOSIS — F341 Dysthymic disorder: Secondary | ICD-10-CM

## 2013-10-05 DIAGNOSIS — R739 Hyperglycemia, unspecified: Secondary | ICD-10-CM

## 2013-10-05 DIAGNOSIS — R490 Dysphonia: Secondary | ICD-10-CM

## 2013-10-05 DIAGNOSIS — R21 Rash and other nonspecific skin eruption: Secondary | ICD-10-CM

## 2013-10-05 DIAGNOSIS — J069 Acute upper respiratory infection, unspecified: Secondary | ICD-10-CM

## 2013-10-05 NOTE — Patient Instructions (Addendum)
General Instructions: -You do not have diabetes. Your Hemoglobin A1C is 5.1% which is a normal value. -Follow up with [facility] for your anxiety.  -You may take Robitussin for cough.  -Use saline nasal spray 4 times per day to relieve your nasal congestion.  -Use Benadryl cream for skin itching.  -You may shower with Hibiclens once or twice per week for relief of the itching as well--follow the instructions on the bottle.  -Apply Neosporin or antibiotic ointment daily to the small wound on your leg. Keep that area dry and clean.  -Call us or go to the ED if the wound on your leg is getting red, inflamed, with pus, or if you have fever or chills.  -Call us as soon as you have Medicaid so we can refer you to an Ear, Nose, and Throat specialist for evaluation of your hoarseness.  -Follow up with [place] as needed or in 3 months.      Treatment Goals:  Goals (1 Years of Data) as of 10/05/13         As of Today     Lifestyle    . Quit smoking / using tobacco  No      Progress Toward Treatment Goals:  Treatment Goal 01/14/2013  Stop smoking smoking the same amount    Self Care Goals & Plans:  Self Care Goal 10/05/2013  Manage my medications take my medicines as prescribed; refill my medications on time  Eat healthy foods eat baked foods instead of fried foods; eat foods that are low in salt  Stop smoking cut down the number of cigarettes smoked; go to the [employer] ([employer])    No flowsheet data found.   Care Management & Community Referrals:  Referral 01/14/2013  Referrals made for care management support social worker  Referrals made to community resources transportation

## 2013-10-06 DIAGNOSIS — R21 Rash and other nonspecific skin eruption: Secondary | ICD-10-CM | POA: Insufficient documentation

## 2013-10-06 DIAGNOSIS — J069 Acute upper respiratory infection, unspecified: Secondary | ICD-10-CM | POA: Insufficient documentation

## 2013-10-06 DIAGNOSIS — E669 Obesity, unspecified: Secondary | ICD-10-CM | POA: Insufficient documentation

## 2013-10-06 NOTE — Assessment & Plan Note (Signed)
Being followed at [facility], on Seroquel and Lexapro. She will call them and make an appointment ASAP to discuss her anxiety.

## 2013-10-06 NOTE — Progress Notes (Signed)
  Subjective:    Patient ID: [name], female    DOB: 07-Aug-1971, 42 y.o.   MRN: [id number]  Anxiety Symptoms include nervous/anxious behavior. Patient reports no chest pain, confusion, dizziness, palpitations or shortness of breath.     [name] is a 42 year old woman with PMH of bipolar disorder, anxiety, depression, history of polysubstance abuse (crack cocaine), who presents for follow up visit. She complains of voice hoarseness that has been present off and on for 3 years now. She continues to smoke tobacco with a 13 pack year smoking history. For the past 5 days she also has had a cough intermittently productive of yellow sputum and URI symptoms with nasal congestion and frontal sinus pressure. She has not taken any medication for her current symptoms.  She has increased anxiety that leads her to scratch her skin. She states that she has been "clean" from drugs for 6 months now, with help from [facility] with classes twice per week and drug screening once weekly but notes that her anxiety is still present though she feels like the Seroquel has helped her some.    Review of Systems  Constitutional: Negative for fever, chills, diaphoresis, activity change, appetite change, fatigue and unexpected weight change.  HENT: Positive for congestion, postnasal drip, rhinorrhea, sinus pressure and voice change. Negative for ear discharge, ear pain, hearing loss, sore throat and trouble swallowing.   Eyes: Negative for pain, discharge and itching.  Respiratory: Positive for cough. Negative for shortness of breath and wheezing.   Cardiovascular: Negative for chest pain, palpitations and leg swelling.  Gastrointestinal: Negative for abdominal pain.  Genitourinary: Negative for dysuria.  Skin: Positive for rash.       Itching   Neurological: Negative for dizziness, weakness, light-headedness and headaches.  Hematological: Negative for adenopathy.  Psychiatric/Behavioral: Negative for confusion and  agitation. The patient is nervous/anxious.        Objective:   Physical Exam  Nursing note and vitals reviewed. Constitutional: She is oriented to person, place, and time. She appears well-developed and well-nourished. No distress.  HENT:  Head: Atraumatic.  Right Ear: External ear normal.  Left Ear: External ear normal.  Mouth/Throat: Oropharynx is clear and moist. No oropharyngeal exudate.  Erythema of bilateral nasal turbinates with clear discharge.  Hoarseness.    Eyes: Conjunctivae are normal. Right eye exhibits no discharge. Left eye exhibits no discharge. No scleral icterus.  Neck: Neck supple.  Cardiovascular: Normal rate and regular rhythm.   Pulmonary/Chest: Effort normal and breath sounds normal. No respiratory distress. She has no wheezes. She has no rales.  Abdominal: Soft. Bowel sounds are normal. She exhibits no distension.  Musculoskeletal: She exhibits no edema and no tenderness.  Lymphadenopathy:    She has no cervical adenopathy.  Neurological: She is alert and oriented to person, place, and time.  Skin: Skin is warm and dry. Rash noted. She is not diaphoretic.  Diffuse papular rash over her anterior shins and forearms with brown discolored borders and no erythematous base with no vesicles. Excoriation marks over her anterior shins with no skin breakdown except for a ~1cm superficial skin break in her left anterior shin with no purulent discharge, no surrounding edema or erythema.   Psychiatric: She has a normal mood and affect. Her behavior is normal.          Assessment & Plan:

## 2013-10-06 NOTE — Assessment & Plan Note (Signed)
  Assessment: Progress toward smoking cessation:   SMoking less Barriers to progress toward smoking cessation:   anxiety, withdrawal sx Comments: She is cutting back, down to 1/2 pack per day from 1 pack per day.   Plan: Instruction/counseling given:  I counseled patient on the dangers of tobacco use, advised patient to stop smoking, and reviewed strategies to maximize success. Educational resources provided:  [employer]) brochure Self management tools provided:  smoking cessation plan (STAR Quit Plan) Medications to assist with smoking cessation:  None Patient agreed to the following self-care plans for smoking cessation: cut down the number of cigarettes smoked;go to the [employer] website ([employer])  Other plans: She wants to continue cutting down the number of cigarettes she is smoking. She also wants her anxiety to be better controlled before she quits.

## 2013-10-06 NOTE — Assessment & Plan Note (Signed)
Se has had hoarseness off and on for 3 years now. She is a current every day smoker and this is concerning for possible laryngeal/vocal cord cancer. I explained these concerns to her and I wanted to refer her to ENT for further evaluation but she does not have medical insurance currently and due to her financial situation, she declines a referral that would require her to pay an out-of-pocket fee. She reports having a hearing for her Medicaid/disability application on Dec 3rd and I encouraged her to call as soon as she is approved for Medicaid or the [employer] so we can refer her to ENT. She agreed and understood.

## 2013-10-06 NOTE — Assessment & Plan Note (Signed)
She states that she has been following up with [facility] with ongoing weekly classes and drug testing. She reports being "drug" free for 6 months. She requests Rx of Klonopin for anxiety but she agreed that she should follow up with [facility] for this problem in order to prevent any relapse in drug use.

## 2013-10-06 NOTE — Assessment & Plan Note (Signed)
Her rash is precipitated by increased anxiety with itching and scratching of her skin. She has history of MRSA skin infection but the area of skin break in her left anterior shin does not appear infected.  She will try to avoid scratching her skin. She will try Benadryl cream to calm down the itching. She will apply antibiotic ointment to her left anterior shin wound.  She was advised to follow up with [place] or go to the ED if she develops fever/chills, N/V, or if she has a skin wound with pururelent discharge, increased swelling/pain/redness. She expressed understanding.

## 2013-10-06 NOTE — Assessment & Plan Note (Addendum)
Her weight is stable but she was briefly counseled on a healthy die. She has had hyperglycemia in past labs and we checked a HgA1C during this visit which was negative.

## 2013-10-06 NOTE — Assessment & Plan Note (Signed)
She has nasal congestion, cough intermittently productive of yellow sputum, post-nasal drip, frontal sinus pressure, all sx for 5 days. She denies fever, chills, SOB. Her symptoms are most consistent with URI with no indication for antibiotic treatment at this time.  -saline nasal spray 4x daily PRN for nasal congestion.  -She was advised to avoid nasal decongestants of limit their use to 3 days.  -Robitussin as needed for cough.  -She was advised to follow up with [place] if her cough is worse, if she has fever/chills, N/V, or SOB.

## 2013-10-09 NOTE — Progress Notes (Signed)
Case discussed with Dr. Kennerly at the time of the visit.  We reviewed the resident's history and exam and pertinent patient test results.  I agree with the assessment, diagnosis, and plan of care documented in the resident's note. 

## 2013-11-05 ENCOUNTER — Emergency Department (HOSPITAL_COMMUNITY)
Admission: EM | Admit: 2013-11-05 | Discharge: 2013-11-06 | Disposition: A | Discharge status: Home / Self Care | Payer: Medicaid Other | Attending: Emergency Medicine | Admitting: Emergency Medicine

## 2013-11-05 DIAGNOSIS — I776 Arteritis, unspecified: Secondary | ICD-10-CM | POA: Insufficient documentation

## 2013-11-05 DIAGNOSIS — F319 Bipolar disorder, unspecified: Secondary | ICD-10-CM | POA: Insufficient documentation

## 2013-11-05 DIAGNOSIS — IMO0002 Reserved for concepts with insufficient information to code with codable children: Secondary | ICD-10-CM | POA: Insufficient documentation

## 2013-11-05 DIAGNOSIS — R229 Localized swelling, mass and lump, unspecified: Secondary | ICD-10-CM | POA: Insufficient documentation

## 2013-11-05 DIAGNOSIS — G8929 Other chronic pain: Secondary | ICD-10-CM | POA: Insufficient documentation

## 2013-11-05 DIAGNOSIS — R21 Rash and other nonspecific skin eruption: Secondary | ICD-10-CM | POA: Insufficient documentation

## 2013-11-05 DIAGNOSIS — M129 Arthropathy, unspecified: Secondary | ICD-10-CM | POA: Insufficient documentation

## 2013-11-05 DIAGNOSIS — F17200 Nicotine dependence, unspecified, uncomplicated: Secondary | ICD-10-CM | POA: Insufficient documentation

## 2013-11-05 DIAGNOSIS — Z8719 Personal history of other diseases of the digestive system: Secondary | ICD-10-CM | POA: Insufficient documentation

## 2013-11-05 DIAGNOSIS — F411 Generalized anxiety disorder: Secondary | ICD-10-CM | POA: Insufficient documentation

## 2013-11-05 DIAGNOSIS — Z8614 Personal history of Methicillin resistant Staphylococcus aureus infection: Secondary | ICD-10-CM | POA: Insufficient documentation

## 2013-11-05 DIAGNOSIS — Z79899 Other long term (current) drug therapy: Secondary | ICD-10-CM | POA: Insufficient documentation

## 2013-11-05 NOTE — ED Notes (Signed)
Per EMS: Pt began having extremity pain 30 minutes prior to getting to home. Pt has swelling to lower legs and nodules felt under feet. Pt took Ibuprofen for pain. Substance abuse hx. Pt 5 months clean from crack/cocaine.

## 2013-11-05 NOTE — ED Notes (Signed)
Bed: WA06 Expected date:  Expected time:  Means of arrival:  Comments: EMS/Lupus/pain 

## 2013-11-06 ENCOUNTER — Encounter (HOSPITAL_COMMUNITY): Payer: Self-pay | Admitting: Emergency Medicine

## 2013-11-06 MED ORDER — PREDNISONE 20 MG PO TABS: 40.0000 mg | ORAL_TABLET | Freq: Every day | ORAL | Status: DC

## 2013-11-06 MED ORDER — PREDNISONE 20 MG PO TABS
40.0000 mg | ORAL_TABLET | Freq: Once | ORAL | Status: AC
  Administered 2013-11-06: 40 mg via ORAL
  Filled 2013-11-06: qty 2

## 2013-11-06 MED ORDER — HYDROMORPHONE HCL PF 1 MG/ML IJ SOLN
1.0000 mg | INTRAMUSCULAR | Status: AC
  Administered 2013-11-06: 1 mg via INTRAMUSCULAR
  Filled 2013-11-06: qty 1

## 2013-11-06 NOTE — ED Provider Notes (Signed)
CSN: [id number]     Arrival date & time 11/05/13  2355 History   First MD Initiated Contact with Patient 11/06/13 0050     Chief Complaint  Patient presents with  . Extremity Pain   (Consider location/radiation/quality/duration/timing/severity/associated sxs/prior Treatment) HPI Comments: Ms. Kimbrough is a 42 year old female with a history of chronic pain as well as an unspecified vasculitis which occurs intermittently, as well as a history of substance abuse with crack cocaine primarily presents with a complaint of arthralgias, rash and nodular growths which have occurred over the last 12 hours. This is very similar to what she experienced in the past, there is no definite etiology that has been diagnosed based on the patient's laboratory workups, she was completely asymptomatic yesterday, has not been on steroids for some time and does not take any other pain medications other than ibuprofen at home which has not been helping this time. She denies fevers, cough, chills, shortness of breath, dysuria, diarrhea or abdominal pain.  Patient is a 42 y.o. female presenting with extremity pain. The history is provided by the patient and medical records.  Extremity Pain    Past Medical History  Diagnosis Date  . Depression   . Chronic pain   . Leukocytoclastic vasculitis   . Tobacco abuse   . Lupus   . Lupus   . Arthritis   . Anxiety   . GERD (gastroesophageal reflux disease)   . MRSA (methicillin resistant Staphylococcus aureus)   . Bipolar disorder 01/21/13   Past Surgical History  Procedure Laterality Date  . Cholecystectomy    . Tubal ligation    . Ankle surgery    . Hardware removal  09/25/2012    Procedure: HARDWARE REMOVAL;  Surgeon: Kerrin Champagne, MD;  Location: [facility] OR;  Service: Orthopedics;  Laterality: Left;  Incision, drainage and debridement of left lateral ankle, removal of plate and screws   Family History  Problem Relation Age of Onset  . Stroke Mother    History   Substance Use Topics  . Smoking status: Current Every Day Smoker -- 0.50 packs/day    Types: Cigarettes  . Smokeless tobacco: Never Used  . Alcohol Use: No   OB History   Grav Para Term Preterm Abortions TAB SAB Ect Mult Living                 Review of Systems  All other systems reviewed and are negative.    Allergies  Review of patient's allergies indicates no known allergies.  Home Medications   Current Outpatient Rx  Name  Route  Sig  Dispense  Refill  . calcium carbonate (TUMS - DOSED IN MG ELEMENTAL CALCIUM) 500 MG chewable tablet   Oral   Chew 1 tablet by mouth daily as needed for heartburn.         . diphenhydramine-acetaminophen (TYLENOL PM) 25-500 MG TABS   Oral   Take 1 tablet by mouth at bedtime as needed.         Marland Kitchen escitalopram (LEXAPRO) 10 MG tablet   Oral   Take 10 mg by mouth daily.         Marland Kitchen HYDROcodone-acetaminophen (NORCO/VICODIN) 5-325 MG per tablet   Oral   Take 1 tablet by mouth every 4 (four) hours as needed for pain.   12 tablet   0   . ibuprofen (ADVIL,MOTRIN) 200 MG tablet   Oral   Take 800 mg by mouth every 6 (six) hours as needed for pain.         Marland Kitchen  QUEtiapine (SEROQUEL XR) 300 MG 24 hr tablet   Oral   Take 300 mg by mouth at bedtime.         . predniSONE (DELTASONE) 20 MG tablet   Oral   Take 2 tablets (40 mg total) by mouth daily.   10 tablet   0    BP 150/87  Pulse 114  Temp(Src) 97.4 F (36.3 C) (Oral)  Resp 18  SpO2 98%  LMP 11/04/2013 Physical Exam  Nursing note and vitals reviewed. Constitutional: She appears well-developed and well-nourished.  HENT:  Head: Normocephalic and atraumatic.  Mouth/Throat: Oropharynx is clear and moist. No oropharyngeal exudate.  Eyes: Conjunctivae and EOM are normal. Pupils are equal, round, and reactive to light. Right eye exhibits no discharge. Left eye exhibits no discharge. No scleral icterus.  Neck: Normal range of motion. Neck supple. No JVD present. No thyromegaly  present.  Cardiovascular: Normal rate, regular rhythm, normal heart sounds and intact distal pulses.  Exam reveals no gallop and no friction rub.   No murmur heard. Pulse of 95 on my exam  Pulmonary/Chest: Effort normal and breath sounds normal. No respiratory distress. She has no wheezes. She has no rales.  Abdominal: Soft. Bowel sounds are normal. She exhibits no distension and no mass. There is no tenderness.  Musculoskeletal: Normal range of motion. She exhibits tenderness. She exhibits no edema.  Tender to palpation over nodule, this includes right lower and left lower extremity plantar surfaces  Lymphadenopathy:    She has no cervical adenopathy.  Neurological: She is alert. Coordination normal.  Skin: Skin is warm and dry. Rash noted. No erythema.  Scattered nodular lesions to the hands, bilateral upper and lower extremities, right knee, right first MCP joint, right wrist. No petechiae or purpura, no urticarial lesions, no vesicles, no pustules  Psychiatric: She has a normal mood and affect. Her behavior is normal.    ED Course  Procedures (including critical care time) Labs Review Labs Reviewed - No data to display Imaging Review No results found.  EKG Interpretation   None       MDM   1. Vasculitis    The patient has what appears to be a recurrence of her vasculitis flare up. She does not appear toxic, she is not febrile, she has no systemic symptoms. Her symptoms have only been present for approximately 12 hours, she is requesting a single dose of pain medication to help get her "pain under control". She will also need to be started on prednisone and can followup with the internal medicine resident clinic where she is a patient.  Patient stable for discharge, and Dilaudid given for pain, prednisone started, short course for home and followup with family doctor, patient expresses her understanding.   Meds given in ED:  Medications  predniSONE (DELTASONE) tablet 40 mg  (40 mg Oral Given 11/06/13 0147)  HYDROmorphone (DILAUDID) injection 1 mg (1 mg Intramuscular Given 11/06/13 0147)    New Prescriptions   PREDNISONE (DELTASONE) 20 MG TABLET    Take 2 tablets (40 mg total) by mouth daily.      Vida Roller, MD 11/06/13 [phone]

## 2014-04-07 ENCOUNTER — Telehealth: Payer: Self-pay | Admitting: *Deleted

## 2014-04-07 NOTE — Telephone Encounter (Signed)
Attempted to contact pt at number in chart to inform her that her MD has recommended that she attend the "Free [facility] Clinic" scheduled [facility] Monday June [facility]th [facility].  Pt's number is invalid and no other contact number is avail.  Will mail letter to pt about the free clinic.Juanell Saffo C Goldston5/20/201511:15 AM

## 2017-06-27 ENCOUNTER — Emergency Department (HOSPITAL_COMMUNITY): Payer: Medicaid Other

## 2017-06-27 ENCOUNTER — Emergency Department (HOSPITAL_COMMUNITY)
Admission: EM | Admit: 2017-06-27 | Discharge: 2017-06-27 | Disposition: A | Discharge status: Home / Self Care | Payer: Medicaid Other | Attending: Emergency Medicine | Admitting: Emergency Medicine

## 2017-06-27 ENCOUNTER — Encounter (HOSPITAL_COMMUNITY): Payer: Self-pay | Admitting: Emergency Medicine

## 2017-06-27 DIAGNOSIS — R05 Cough: Secondary | ICD-10-CM | POA: Diagnosis not present

## 2017-06-27 DIAGNOSIS — J029 Acute pharyngitis, unspecified: Secondary | ICD-10-CM | POA: Diagnosis present

## 2017-06-27 DIAGNOSIS — R1310 Dysphagia, unspecified: Secondary | ICD-10-CM

## 2017-06-27 DIAGNOSIS — Z79899 Other long term (current) drug therapy: Secondary | ICD-10-CM | POA: Insufficient documentation

## 2017-06-27 DIAGNOSIS — F17210 Nicotine dependence, cigarettes, uncomplicated: Secondary | ICD-10-CM | POA: Diagnosis not present

## 2017-06-27 DIAGNOSIS — R499 Unspecified voice and resonance disorder: Secondary | ICD-10-CM | POA: Insufficient documentation

## 2017-06-27 DIAGNOSIS — J449 Chronic obstructive pulmonary disease, unspecified: Secondary | ICD-10-CM | POA: Diagnosis not present

## 2017-06-27 LAB — I-STAT CHEM 8, ED
BUN: <3 mg/dL — ABNORMAL LOW (ref 6–20)
Calcium, Ion: 1.12 mmol/L — ABNORMAL LOW (ref 1.15–1.40)
Chloride: 102 mmol/L (ref 101–111)
Creatinine, Ser: 0.7 mg/dL (ref 0.44–1.00)
Glucose, Bld: 95 mg/dL (ref 65–99)
HCT: 40 % (ref 36.0–46.0)
Hemoglobin: 13.6 g/dL (ref 12.0–15.0)
Potassium: 3.8 mmol/L (ref 3.5–5.1)
Sodium: 140 mmol/L (ref 135–145)
TCO2: 26 mmol/L (ref 0–100)

## 2017-06-27 LAB — RAPID STREP SCREEN (MED CTR MEBANE ONLY): Streptococcus, Group A Screen (Direct): NEGATIVE

## 2017-06-27 MED ORDER — IOPAMIDOL (ISOVUE-300) INJECTION 61%
INTRAVENOUS | Status: AC
  Administered 2017-06-27: 75 mL
  Filled 2017-06-27: qty 75

## 2017-06-27 NOTE — ED Triage Notes (Signed)
Onset one month ago developed a intermittent productive cough green sputum, raspy voice, and sore throat. Airway intact bilateral equal chest rise and fall.

## 2017-06-27 NOTE — ED Notes (Signed)
Patient transported to CT 

## 2017-06-27 NOTE — Discharge Instructions (Signed)
Please follow up with ENT

## 2017-06-27 NOTE — ED Notes (Signed)
Called x3 for room with no answer 

## 2017-06-27 NOTE — ED Notes (Signed)
IV attempt x1

## 2017-06-27 NOTE — ED Provider Notes (Signed)
Pt signed out to me by Ardine EngM Fawze PA-C. She is a 46 year old with cough and dysphagia, voice change. +Smoker. CT neck pending.  5:25 PM CT is negative. She is upset because she has been here a long time. Will d/c with ENT follow up.       Bethel BornGekas, Enedina Pair Marie, PA-C 06/27/17 1737    Linwood DibblesKnapp, Jon, MD 07/01/17 [phone]

## 2017-06-27 NOTE — ED Notes (Signed)
This RN attempted Iv access, unsuccessful. Another RN to try

## 2017-06-27 NOTE — ED Provider Notes (Signed)
[facility]-EMERGENCY DEPT Provider Note   CSN: [id number] Arrival date & time: 06/27/17  1159  By signing my name below, I, Rosana Fretana Waskiewicz, attest that this documentation has been prepared under the direction and in the presence of non-physician practitioner, Michela PitcherFawze, Kiaan Overholser, PA-C. Electronically Signed: Rosana Fretana Waskiewicz, ED Scribe. 06/27/17. 2:09 PM.  History   Chief Complaint Chief Complaint  Patient presents with  . Sore Throat  . Cough   The history is provided by the patient. No language interpreter was used.   HPI Comments: [name] is a 46 y.o. female who presents to the Emergency Department complaining of gradual onset, progressively worsening sore throat and voice change for 2 months. She endorses occasional dysphagia and persistent odynophagia during this time, especially while eating tough meat. No difficulty swallowing liquids. States she has a history of tonsillitis in the past which she states feels similar but this has been persistent. Also endorses voice change and says that her voice is now raspy" people can't understand me on the phone anymore". She also endorses ongoing nasal congestion 1 month. She has tried ibuprofen and Tylenol without significant relief of her symptoms. Denies fevers or chills or weight loss. She does state that time she wakes up in the middle of the night gagging with a sensation that it is difficult to swallow. She has a history of COPD and denies any worsening shortness of breath and endorses a cough productive of brown sputum which is chronic and unchanged. Denies chest pain, abdominal pain, vomiting, or headaches. She is a 1ppd smoker and has been smoking since she was [age]years old.   Past Medical History:  Diagnosis Date  . Anxiety   . Arthritis   . Bipolar disorder (HCC) 01/21/13  . Chronic pain   . Depression   . GERD (gastroesophageal reflux disease)   . 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 Lupus   . Lupus   . MRSA (methicillin resistant  Staphylococcus aureus)   . Tobacco abuse     Patient Active Problem List   Diagnosis Date Noted  . Rash and nonspecific skin eruption 10/06/2013  . URI, acute 10/06/2013  . Obesity, unspecified 10/06/2013  . Tobacco use disorder 10/05/2013  . Hoarseness, chronic 09/05/2013  . 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 Bipolar disorder (HCC)   . Polysubstance abuse 09/13/2012  . Anxiety and depression 07/19/2012  . Leukocytoclastic vasculitis, concern for 03/08/2012    Past Surgical History:  Procedure Laterality Date  . ANKLE SURGERY    . CHOLECYSTECTOMY    . HARDWARE REMOVAL  09/25/2012   Procedure: HARDWARE REMOVAL;  Surgeon: Kerrin ChampagneJames E Nitka, MD;  [facility];  Service: Orthopedics;  Laterality: Left;  Incision, drainage and debridement of left lateral ankle, removal of plate and screws  . TUBAL LIGATION      OB History    No data available       Home Medications    Prior to Admission medications   Medication Sig Start Date End Date Taking? Authorizing Provider  calcium carbonate (TUMS - DOSED IN MG ELEMENTAL CALCIUM) 500 MG chewable tablet Chew 1 tablet by mouth daily as needed for heartburn.    [provider]  diphenhydramine-acetaminophen (TYLENOL PM) 25-500 MG TABS Take 1 tablet by mouth at bedtime as needed.    [provider]  escitalopram (LEXAPRO) 10 MG tablet Take 10 mg by mouth daily.    [provider]  HYDROcodone-acetaminophen (NORCO/VICODIN) 5-325 MG per tablet Take 1 tablet by mouth  every 4 (four) hours as needed for pain. 09/20/13   Elpidio Anis, PA-C  ibuprofen (ADVIL,MOTRIN) 200 MG tablet Take 800 mg by mouth every 6 (six) hours as needed for pain.    [provider]  predniSONE (DELTASONE) 20 MG tablet Take 2 tablets (40 mg total) by mouth daily. 11/06/13   Eber Hong, MD  QUEtiapine (SEROQUEL XR) 300 MG 24 hr tablet Take 300 mg by mouth at bedtime.    [provider]    Family History Family History    Problem Relation Age of Onset  . Stroke Mother     Social History Social History  Substance Use Topics  . Smoking status: Current Every Day Smoker    Packs/day: 0.50    Types: Cigarettes  . Smokeless tobacco: Never Used  . Alcohol use No     Allergies   Patient has no known allergies.   Review of Systems Review of Systems  Constitutional: Positive for chills. Negative for fever and unexpected weight change.  HENT: Positive for congestion, rhinorrhea, sore throat, trouble swallowing and voice change.   Respiratory: Positive for cough (chronic, unchanged) and shortness of breath (chronic, unchanged).   Gastrointestinal: Positive for nausea and vomiting.  Musculoskeletal: Positive for neck pain.  All other systems reviewed and are negative.    Physical Exam Updated Vital Signs BP 122/80 (BP Location: Left Arm)   Pulse 94   Temp 98.4 F (36.9 C) (Oral)   Resp 19   Ht 5\' 3"  (1.6 m)   Wt 90.7 kg (200 lb)   LMP 06/20/2017   SpO2 94%   BMI 35.43 kg/m   Physical Exam  Constitutional: She appears well-developed and well-nourished. No distress.  HENT:  Head: Normocephalic and atraumatic.  Right Ear: Tympanic membrane and external ear normal.  Left Ear: Tympanic membrane and external ear normal.  Mouth/Throat: No oropharyngeal exudate or tonsillar abscesses.  TMs normal bilaterally with no erythema or bulging. No tenderness to palpation of the maxillary or frontal sinuses. Nasal septum is midline with pink mucosa and nasal congestion noted bilaterally R>L. posterior oropharynx without erythema, tonsillar hypertrophy, exudates, or uvular deviation. No trismus or sublingual abnormalities  Eyes: Pupils are equal, round, and reactive to light. Conjunctivae are normal. Right eye exhibits no discharge. Left eye exhibits no discharge.  Neck: Normal range of motion. Neck supple. No JVD present. No tracheal deviation present.  Diffuse tenderness to palpation of the anterior neck,  no midline spine TTP, no paraspinal muscle tenderness, no deformity, crepitus, or step-off noted.  Left anterior cervical lymphadenopathy noted  Cardiovascular: Normal rate, regular rhythm and normal heart sounds.  Exam reveals no gallop and no friction rub.   No murmur heard. Pulmonary/Chest: Effort normal and breath sounds normal. No stridor. No respiratory distress. She has no wheezes. She has no rales.  Globally hypoactive breath sounds, equal rise and fall of chest, no increased work of breathing  Abdominal: She exhibits no distension.  Musculoskeletal: She exhibits no edema.  Neurological: She is alert.  Fluent speech, no facial droop  Skin: Skin is warm and dry. No erythema.  Psychiatric: She has a normal mood and affect. Her behavior is normal.  Nursing note and vitals reviewed.    ED Treatments / Results  DIAGNOSTIC STUDIES: Oxygen Saturation is 94% on RA, adequate by my interpretation.   COORDINATION OF CARE: 2:09 PM-Discussed next steps with pt. Pt verbalized understanding and is agreeable with the plan.   Labs (all labs ordered are listed,  but only abnormal results are displayed) Labs Reviewed  I-STAT CHEM 8, ED - Abnormal; Notable for the following:       Result Value   BUN <3 (*)    Calcium, Ion 1.12 (*)    All other components within normal limits  RAPID STREP SCREEN (NOT AT [facility])  CULTURE, GROUP A STREP [facility])    EKG  EKG Interpretation None       Radiology Dg Chest 2 View  Result Date: 06/27/2017 CLINICAL DATA:  Productive cough for 2 weeks, some dizziness, smoking history EXAM: CHEST  2 VIEW COMPARISON:  Chest x-ray of 09/03/2013 FINDINGS: Prominent interstitial markings are noted diffusely primarily at the lung bases most consistent with fibrosis. No focal infiltrate or effusion is seen. Mediastinal and hilar contours are unremarkable. The heart is within normal limits in size. No acute bony abnormality is seen. Old fractures of the lateral left third,  fourth and fifth ribs are noted. IMPRESSION: 1. Prominent interstitial markings most likely chronic in this patient with smoking history. No definite active process. 2. Old left third, fourth and fifth rib fractures. Electronically Signed   By: Dwyane Dee M.D.   On: 06/27/2017 14:09   Ct Soft Tissue Neck W Contrast  Result Date: 06/27/2017 CLINICAL DATA:  Throat pain, dysphagia and odynophagia. Cough and smoking history. EXAM: CT NECK WITH CONTRAST TECHNIQUE: Multidetector CT imaging of the neck was performed using the standard protocol following the bolus administration of intravenous contrast. CONTRAST:  75mL ISOVUE-300 IOPAMIDOL (ISOVUE-300) INJECTION 61% COMPARISON:  None. FINDINGS: Pharynx and larynx: --Nasopharynx: Fossae of Rosenmuller are clear. Normal adenoid tonsils for age. --Oral cavity and oropharynx: The palatine and lingual tonsils are normal. The visible oral cavity and floor of mouth are normal. --Hypopharynx: Normal vallecula and pyriform sinuses. --Larynx: Normal epiglottis and pre-epiglottic space. Normal aryepiglottic and vocal folds. --Retropharyngeal space: No abscess, effusion or lymphadenopathy. Salivary glands: --Parotid: No mass lesion or inflammation. No sialolithiasis or ductal dilatation. --Submandibular: Symmetric without inflammation. No sialolithiasis or ductal dilatation. --Sublingual: Normal. No ranula or other visible lesion of the base of tongue and floor of mouth. Thyroid: Normal. Lymph nodes: No enlarged or abnormal density lymph nodes. Vascular: Major cervical vessels are patent. Limited intracranial: Normal. Visualized orbits: Normal. Mastoids and visualized paranasal sinuses: No fluid levels or advanced mucosal thickening. No mastoid effusion. Skeleton: No bony spinal canal stenosis. No lytic or blastic lesions. Upper chest: Large right apical bulla. Other: None. IMPRESSION: Normal CT of the neck. No findings to explain the reported symptoms. Electronically Signed   By:  Deatra Robinson M.D.   On: 06/27/2017 17:18    Procedures Procedures (including critical care time)  Medications Ordered in ED Medications  iopamidol (ISOVUE-300) 61 % injection (75 mLs  Contrast Given 06/27/17 1631)     Initial Impression / Assessment and Plan / ED Course  I have reviewed the triage vital signs and the nursing notes.  Pertinent labs & imaging results that were available during my care of the patient were reviewed by me and considered in my medical decision making (see chart for details).    Patient with 2 months of voice hoarseness, difficulty swallowing, and pain with swallowing. Afebrile, vital signs are stable and SPO2 94% on room air. Patient is resting comfortably in bed without any acute respiratory distress. X-ray shows findings consistent with her COPD but no acute abnormalities. Rapid strep screen negative, do not suspect strep pharyngitis. With tenderness to palpation of the anterior neck and ongoing  symptoms, will obtain CT of the soft tissues of the neck with contrast. 5:08PM Signed out to oncoming provider PA Gekas. Awaiting CT results. If CT is without acute abnormality, patient is stable for discharge home with ENT follow-up.   F inal Clinical Impressions(s) / ED Diagnoses   Final diagnoses:  Change in voice  Odynophagia  Dysphagia, unspecified type    New Prescriptions New Prescriptions   No medications on file  I personally performed the services described in this documentation, which was scribed in my presence. The recorded information has been reviewed and is accurate.     Jeanie Sewer, PA-C 06/27/17 1731    Vanetta Mulders, MD 06/28/17 [phone]

## 2017-06-29 LAB — CULTURE, GROUP A STREP (THRC)

## 2017-09-12 ENCOUNTER — Emergency Department (HOSPITAL_COMMUNITY)
Admission: EM | Admit: 2017-09-12 | Discharge: 2017-09-12 | Disposition: A | Discharge status: Home / Self Care | Payer: Medicaid Other | Attending: Emergency Medicine | Admitting: Emergency Medicine

## 2017-09-12 ENCOUNTER — Encounter (HOSPITAL_COMMUNITY): Payer: Self-pay | Admitting: Nurse Practitioner

## 2017-09-12 ENCOUNTER — Emergency Department (HOSPITAL_COMMUNITY): Payer: Medicaid Other

## 2017-09-12 DIAGNOSIS — L03116 Cellulitis of left lower limb: Secondary | ICD-10-CM | POA: Diagnosis not present

## 2017-09-12 DIAGNOSIS — F17210 Nicotine dependence, cigarettes, uncomplicated: Secondary | ICD-10-CM | POA: Insufficient documentation

## 2017-09-12 DIAGNOSIS — Z79899 Other long term (current) drug therapy: Secondary | ICD-10-CM | POA: Diagnosis not present

## 2017-09-12 DIAGNOSIS — M25572 Pain in left ankle and joints of left foot: Secondary | ICD-10-CM | POA: Diagnosis present

## 2017-09-12 HISTORY — DX: Malingerer (conscious simulation): Z76.5

## 2017-09-12 LAB — I-STAT BETA HCG BLOOD, ED (MC, WL, AP ONLY)

## 2017-09-12 MED ORDER — FENTANYL CITRATE (PF) 100 MCG/2ML IJ SOLN
100.0000 ug | Freq: Once | INTRAMUSCULAR | Status: AC
  Administered 2017-09-12: 100 ug via INTRAVENOUS
  Filled 2017-09-12: qty 2

## 2017-09-12 MED ORDER — HYDROCODONE-ACETAMINOPHEN 5-325 MG PO TABS: 2.0000 | ORAL_TABLET | Freq: Once | ORAL | Status: DC

## 2017-09-12 MED ORDER — SULFAMETHOXAZOLE-TRIMETHOPRIM 800-160 MG PO TABS: 1.0000 | ORAL_TABLET | Freq: Two times a day (BID) | ORAL | 0 refills | Status: AC

## 2017-09-12 MED ORDER — HYDROCODONE-ACETAMINOPHEN 5-325 MG PO TABS
1.0000 | ORAL_TABLET | Freq: Once | ORAL | Status: AC
  Administered 2017-09-12: 1 via ORAL
  Filled 2017-09-12: qty 1

## 2017-09-12 NOTE — ED Triage Notes (Addendum)
Pt cut leg while shaving last week but notes that laceration has not healed since. Pt sts previously she got a laceration and required abx therapy. Pt endorses pain and redness at site, no streaking or heat, fevers or chills. Pt has had hx of vasculitis

## 2017-09-12 NOTE — Discharge Instructions (Signed)
Apply warm compresses to the area or soak the area in warm water to help continue express drainage.   Keep the wound clean and dry. Gently wash the wound with soap and water and make sure to pat it dry.   Take antibiotic and complete the entire course.   You can take Tylenol or Ibuprofen as directed for pain.  Followup with your orthopedic doctor  in 2 days for wound recheck and packing removal.   Return to the Emergency Department if you experienced any worsening/spreading redness or swelling, numbness/weakness, fever, worsening pain, or any other worsening or concerning symptoms.

## 2017-09-12 NOTE — ED Provider Notes (Signed)
[facility] EMERGENCY DEPARTMENT Provider Note   CSN: [id number] Arrival date & time: 09/12/17  1614     History   Chief Complaint Chief Complaint  Patient presents with  . Ankle Pain    HPI [name] is a 46 y.o. female who presents with 1 months of persistent left ankle wound and ankle pain. Patient reports that symptoms began approximately one month ago after she accidentally cut the skin overlying lateral malleolus while shaving. She reports a small 1 cm wound that has had difficulty healing since then. She does note that it has scabbed over but that the scab repeatedly falls often continues to drain. She notes that it recently opened up approximately one day ago. She states that she has noticed some drainage from the site. Patient is concerned because she has a metal plate in the medial malleolus of her left ankle and states that she has had an infection there before. She does note that it has been intermittently red, swollen, warm. She denies any fever. Patient denies any numbness/weakness.  The history is provided by the patient.    Past Medical History:  Diagnosis Date  . Anxiety   . Arthritis   . Bipolar disorder (HCC) 01/21/13  . Chronic pain   . Depression   . Drug-seeking behavior   . GERD (gastroesophageal reflux disease)   . 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 Lupus   . Lupus   . MRSA (methicillin resistant Staphylococcus aureus)   . Tobacco abuse     Patient Active Problem List   Diagnosis Date Noted  . Rash and nonspecific skin eruption 10/06/2013  . URI, acute 10/06/2013  . Obesity, unspecified 10/06/2013  . Tobacco use disorder 10/05/2013  . Hoarseness, chronic 09/05/2013  . 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 Bipolar disorder (HCC)   . Polysubstance abuse (HCC) 09/13/2012  . Anxiety and depression 07/19/2012  . Leukocytoclastic vasculitis, concern for 03/08/2012    Past Surgical History:  Procedure Laterality Date  . ANKLE  SURGERY    . CHOLECYSTECTOMY    . HARDWARE REMOVAL  09/25/2012   Procedure: HARDWARE REMOVAL;  Surgeon: Kerrin Champagne, MD;  Location: [facility] OR;  Service: Orthopedics;  Laterality: Left;  Incision, drainage and debridement of left lateral ankle, removal of plate and screws  . TUBAL LIGATION      OB History    No data available       Home Medications    Prior to Admission medications   Medication Sig Start Date End Date Taking? Authorizing Provider  calcium carbonate (TUMS - DOSED IN MG ELEMENTAL CALCIUM) 500 MG chewable tablet Chew 1 tablet by mouth daily as needed for heartburn.    [provider]  diphenhydramine-acetaminophen (TYLENOL PM) 25-500 MG TABS Take 1 tablet by mouth at bedtime as needed.    [provider]  escitalopram (LEXAPRO) 10 MG tablet Take 10 mg by mouth daily.    [provider]  HYDROcodone-acetaminophen (NORCO/VICODIN) 5-325 MG per tablet Take 1 tablet by mouth every 4 (four) hours as needed for pain. 09/20/13   Elpidio Anis, PA-C  ibuprofen (ADVIL,MOTRIN) 200 MG tablet Take 800 mg by mouth every 6 (six) hours as needed for pain.    [provider]  predniSONE (DELTASONE) 20 MG tablet Take 2 tablets (40 mg total) by mouth daily. 11/06/13   Eber Hong, MD  QUEtiapine (SEROQUEL XR) 300 MG 24 hr tablet Take 300 mg by mouth at bedtime.  [provider]  sulfamethoxazole-trimethoprim (BACTRIM DS,SEPTRA DS) 800-160 MG tablet Take 1 tablet by mouth 2 (two) times daily. 09/12/17 09/19/17  Maxwell CaulLayden, Remy Dia A, PA-C    Family History Family History  Problem Relation Age of Onset  . Stroke Mother     Social History Social History  Substance Use Topics  . Smoking status: Current Every Day Smoker    Packs/day: 0.50    Types: Cigarettes  . Smokeless tobacco: Never Used  . Alcohol use No     Allergies   Patient has no known allergies.   Review of Systems Review of Systems  Constitutional: Negative for chills and  fever.  Respiratory: Negative for shortness of breath.   Cardiovascular: Negative for chest pain.  Gastrointestinal: Negative for nausea and vomiting.  Musculoskeletal:       Left foot pain  Skin: Positive for color change.  Neurological: Negative for weakness and numbness.     Physical Exam Updated Vital Signs BP 123/71 (BP Location: Right Arm)   Pulse 87   Temp 98.1 F (36.7 C) (Oral)   Resp 16   SpO2 98%   Physical Exam  Constitutional: She appears well-developed and well-nourished.  HENT:  Head: Normocephalic and atraumatic.  Eyes: Conjunctivae and EOM are normal. Right eye exhibits no discharge. Left eye exhibits no discharge. No scleral icterus.  Cardiovascular:  Pulses:      Dorsalis pedis pulses are 2+ on the right side, and 2+ on the left side.  Pulmonary/Chest: Effort normal.  Musculoskeletal:  Tenderness palpation to the lateral malleolus of the left ankle. No deformity or crepitus noted. There is a well-healed surgical scar noted to the lateral malleolus and left ankle. Dorsiflexion and plantarflexion intact without difficulty. Patient able move all 5 digits of left foot without difficulty.  Neurological: She is alert.  Sensation intact along major nerve distributions of the bilateral feet.  Skin: Skin is warm and dry. Capillary refill takes less than 2 seconds.  1.5 cm wound noted to the lateral malleolus of the left ankle with mild surrounding redness. The left foot is not dusky in appearance or cold to touch. Good distal cap refill.  Psychiatric: She has a normal mood and affect. Her speech is normal and behavior is normal.  Nursing note and vitals reviewed.      ED Treatments / Results  Labs (all labs ordered are listed, but only abnormal results are displayed) Labs Reviewed  I-STAT BETA HCG BLOOD, ED ([facility], [facility], [facility] ONLY)    EKG  EKG Interpretation None       Radiology Dg Ankle Complete Left  Result Date: 09/12/2017 CLINICAL DATA:  None healing  laceration of the left lower leg EXAM: LEFT ANKLE COMPLETE - 3+ VIEW COMPARISON:  September 17, 2012 FINDINGS: There is no evidence of fracture, dislocation, or joint effusion. Chronic deformity of the left fibula is noted. Two screws identified within medial tibia. Soft tissue swelling inferior to fibula is identified. There is no evidence of osteomyelitis. No radiopaque foreign body noted. Soft tissues are unremarkable. IMPRESSION: There is soft tissue swelling inferior to the fibula. There is no evidence of osteomyelitis or radiopaque foreign body. Electronically Signed   By: Sherian ReinWei-Chen  Lin M.D.   On: 09/12/2017 19:50    Procedures Procedures (including critical care time)  Medications Ordered in ED Medications  fentaNYL (SUBLIMAZE) injection 100 mcg (100 mcg Intravenous Given 09/12/17 1846)  HYDROcodone-acetaminophen (NORCO/VICODIN) 5-325 MG per tablet 1 tablet (1 tablet Oral Given 09/12/17 2101)  Initial Impression / Assessment and Plan / ED Course  I have reviewed the triage vital signs and the nursing notes.  Pertinent labs & imaging results that were available during my care of the patient were reviewed by me and considered in my medical decision making (see chart for details).     46 y.o. F who presents with persistent pain to the left ankle. Symptoms began one month ago after a small wound that has not been healing. Patient concerned because she missed some redness around the area and has had persistent pain. She also has a history of metal hardware in the medial malleolus of the left ankle. Patient reports that she still been able to ambulate without any difficulty. No fevers, numbness. Patient is afebrile, non-toxic appearing, sitting comfortably on examination table. Vital signs reviewed and stable. Patient is neurovascularly intact. Consider cellulitis versus abrasion. History/physical examination concerning for DVT, septic arthritis. Do not suspect osteomyelitis but given the  patient's hardware and past medical history, also consideration. Will check check x-ray for evaluation of metal hardware and any subcutaneous air. Analgesics provided in the department.  X-rays reviewed. Negative for any acute fracture dislocation. No evidence of subcutaneous air. Hardware of medial malleolus appears intact. Discussed results with patient. Given the patient is afebrile, she has localized redness and pain to the area, do not feel that patient needs IV antibiotics at this time. Patient does have a history of MRSA infection. Will plan to give by oral antibiotics for MRSA coverage. Patient has been able to ambulate bathroom multiple times throughout ED visit. Patient stable for discharge at this time. Instructed patient to follow-up with PCP in 2 days. Strict return precautions discussed. Patient expresses understanding and agreement to plan.    Final Clinical Impressions(s) / ED Diagnoses   Final diagnoses:  Cellulitis of left lower extremity    New Prescriptions Discharge Medication List as of 09/12/2017  9:04 PM    START taking these medications   Details  sulfamethoxazole-trimethoprim (BACTRIM DS,SEPTRA DS) 800-160 MG tablet Take 1 tablet by mouth 2 (two) times daily., Starting Thu 09/12/2017, Until Thu 09/19/2017, Print         Maxwell Caul, PA-C 09/13/17 1914    Gerhard Munch, MD 09/16/17 [phone]

## 2018-04-17 ENCOUNTER — Ambulatory Visit (INDEPENDENT_AMBULATORY_CARE_PROVIDER_SITE_OTHER): Payer: Self-pay | Admitting: Specialist

## 2018-04-24 ENCOUNTER — Ambulatory Visit (INDEPENDENT_AMBULATORY_CARE_PROVIDER_SITE_OTHER): Payer: Medicaid Other

## 2018-04-24 ENCOUNTER — Ambulatory Visit (INDEPENDENT_AMBULATORY_CARE_PROVIDER_SITE_OTHER): Payer: Medicaid Other | Admitting: Surgery

## 2018-04-24 ENCOUNTER — Encounter (INDEPENDENT_AMBULATORY_CARE_PROVIDER_SITE_OTHER): Payer: Self-pay | Admitting: Surgery

## 2018-04-24 DIAGNOSIS — M19072 Primary osteoarthritis, left ankle and foot: Secondary | ICD-10-CM | POA: Diagnosis not present

## 2018-04-24 DIAGNOSIS — M25572 Pain in left ankle and joints of left foot: Secondary | ICD-10-CM | POA: Diagnosis not present

## 2018-05-08 ENCOUNTER — Ambulatory Visit (INDEPENDENT_AMBULATORY_CARE_PROVIDER_SITE_OTHER): Payer: Medicaid Other | Admitting: Orthopedic Surgery

## 2018-05-08 ENCOUNTER — Encounter (INDEPENDENT_AMBULATORY_CARE_PROVIDER_SITE_OTHER): Payer: Self-pay | Admitting: Orthopedic Surgery

## 2018-05-08 DIAGNOSIS — M25572 Pain in left ankle and joints of left foot: Secondary | ICD-10-CM

## 2018-05-08 DIAGNOSIS — M12572 Traumatic arthropathy, left ankle and foot: Secondary | ICD-10-CM | POA: Diagnosis not present
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
## 2018-06-09 ENCOUNTER — Other Ambulatory Visit (INDEPENDENT_AMBULATORY_CARE_PROVIDER_SITE_OTHER): Payer: Self-pay | Admitting: Orthopedic Surgery

## 2018-06-09 DIAGNOSIS — M12572 Traumatic arthropathy, left ankle and foot: Secondary | ICD-10-CM

## 2018-06-24 ENCOUNTER — Encounter (HOSPITAL_COMMUNITY): Payer: Self-pay | Admitting: *Deleted

## 2018-06-24 ENCOUNTER — Other Ambulatory Visit: Payer: Self-pay

## 2018-06-25 ENCOUNTER — Ambulatory Visit (HOSPITAL_COMMUNITY): Payer: Medicaid Other

## 2018-06-25 ENCOUNTER — Ambulatory Visit (HOSPITAL_COMMUNITY)
Admission: RE | Admit: 2018-06-25 | Discharge: 2018-06-25 | Disposition: A | Discharge status: Home / Self Care | Payer: Medicaid Other | Source: Ambulatory Visit | Attending: Orthopedic Surgery | Admitting: Orthopedic Surgery

## 2018-06-25 ENCOUNTER — Encounter (HOSPITAL_COMMUNITY)
Admission: RE | Disposition: A | Discharge status: Home / Self Care | Payer: Self-pay | Source: Ambulatory Visit | Attending: Orthopedic Surgery

## 2018-06-25 ENCOUNTER — Encounter (HOSPITAL_COMMUNITY): Payer: Self-pay

## 2018-06-25 DIAGNOSIS — R0600 Dyspnea, unspecified: Secondary | ICD-10-CM | POA: Insufficient documentation

## 2018-06-25 DIAGNOSIS — F17210 Nicotine dependence, cigarettes, uncomplicated: Secondary | ICD-10-CM | POA: Diagnosis not present

## 2018-06-25 DIAGNOSIS — Z981 Arthrodesis status: Secondary | ICD-10-CM | POA: Diagnosis not present

## 2018-06-25 DIAGNOSIS — F319 Bipolar disorder, unspecified: Secondary | ICD-10-CM | POA: Diagnosis not present

## 2018-06-25 DIAGNOSIS — M19172 Post-traumatic osteoarthritis, left ankle and foot: Secondary | ICD-10-CM | POA: Diagnosis present

## 2018-06-25 DIAGNOSIS — Z8614 Personal history of Methicillin resistant Staphylococcus aureus infection: Secondary | ICD-10-CM | POA: Diagnosis not present

## 2018-06-25 DIAGNOSIS — F419 Anxiety disorder, unspecified: Secondary | ICD-10-CM | POA: Insufficient documentation

## 2018-06-25 DIAGNOSIS — M12572 Traumatic arthropathy, left ankle and foot: Secondary | ICD-10-CM | POA: Diagnosis not present

## 2018-06-25 DIAGNOSIS — F410 Panic disorder [episodic paroxysmal anxiety] without agoraphobia: Secondary | ICD-10-CM | POA: Diagnosis not present

## 2018-06-25 DIAGNOSIS — I776 Arteritis, unspecified: Secondary | ICD-10-CM | POA: Insufficient documentation

## 2018-06-25 DIAGNOSIS — G8929 Other chronic pain: Secondary | ICD-10-CM | POA: Insufficient documentation

## 2018-06-25 DIAGNOSIS — S82892S Other fracture of left lower leg, sequela: Secondary | ICD-10-CM | POA: Diagnosis not present

## 2018-06-25 DIAGNOSIS — M329 Systemic lupus erythematosus, unspecified: Secondary | ICD-10-CM | POA: Diagnosis not present

## 2018-06-25 DIAGNOSIS — R490 Dysphonia: Secondary | ICD-10-CM | POA: Insufficient documentation

## 2018-06-25 DIAGNOSIS — G4730 Sleep apnea, unspecified: Secondary | ICD-10-CM | POA: Diagnosis not present

## 2018-06-25 DIAGNOSIS — K219 Gastro-esophageal reflux disease without esophagitis: Secondary | ICD-10-CM | POA: Insufficient documentation

## 2018-06-25 DIAGNOSIS — J449 Chronic obstructive pulmonary disease, unspecified: Secondary | ICD-10-CM | POA: Insufficient documentation

## 2018-06-25 DIAGNOSIS — Z9049 Acquired absence of other specified parts of digestive tract: Secondary | ICD-10-CM | POA: Insufficient documentation

## 2018-06-25 HISTORY — DX: Unspecified intracranial injury with loss of consciousness of unspecified duration, initial encounter: S06.9X9A

## 2018-06-25 HISTORY — DX: Chronic obstructive pulmonary disease, unspecified: J44.9

## 2018-06-25 HISTORY — PX: ANKLE ARTHROSCOPY: SHX545

## 2018-06-25 HISTORY — DX: Dyspnea, unspecified: R06.00

## 2018-06-25 LAB — COMPREHENSIVE METABOLIC PANEL
ALK PHOS: 84 U/L (ref 38–126)
ALT: 15 U/L (ref 0–44)
AST: 17 U/L (ref 15–41)
Albumin: 3.3 g/dL — ABNORMAL LOW (ref 3.5–5.0)
Anion gap: 10 (ref 5–15)
BUN: 6 mg/dL (ref 6–20)
CALCIUM: 8.9 mg/dL (ref 8.9–10.3)
CHLORIDE: 104 mmol/L (ref 98–111)
CO2: 24 mmol/L (ref 22–32)
CREATININE: 0.77 mg/dL (ref 0.44–1.00)
GFR calc non Af Amer: >60 mL/min (ref >60)
Glucose, Bld: 91 mg/dL (ref 70–99)
Potassium: 3.4 mmol/L — ABNORMAL LOW (ref 3.5–5.1)
SODIUM: 138 mmol/L (ref 135–145)
Total Bilirubin: 0.6 mg/dL (ref 0.3–1.2)
Total Protein: 6.5 g/dL (ref 6.5–8.1)

## 2018-06-25 LAB — POCT PREGNANCY, URINE: Preg Test, Ur: NEGATIVE

## 2018-06-25 LAB — HEMOGLOBIN: Hemoglobin: 13.7 g/dL (ref 12.0–15.0)

## 2018-06-25 SURGERY — ARTHROSCOPY, ANKLE
Anesthesia: General | Laterality: Left

## 2018-06-25 MED ORDER — KETOROLAC TROMETHAMINE 30 MG/ML IJ SOLN
INTRAMUSCULAR | Status: DC | PRN
  Administered 2018-06-25: 30 mg via INTRAVENOUS

## 2018-06-25 MED ORDER — OXYCODONE-ACETAMINOPHEN 5-325 MG PO TABS: 1.0000 | ORAL_TABLET | ORAL | 0 refills | Status: DC | PRN

## 2018-06-25 MED ORDER — CHLORHEXIDINE GLUCONATE 4 % EX LIQD: 60.0000 mL | Freq: Once | CUTANEOUS | Status: DC

## 2018-06-25 MED ORDER — PROPOFOL 10 MG/ML IV BOLUS
INTRAVENOUS | Status: DC | PRN
  Administered 2018-06-25: 50 mg via INTRAVENOUS
  Administered 2018-06-25: 150 mg via INTRAVENOUS

## 2018-06-25 MED ORDER — LIDOCAINE 2% (20 MG/ML) 5 ML SYRINGE
INTRAMUSCULAR | Status: DC | PRN
  Administered 2018-06-25: 80 mg via INTRAVENOUS

## 2018-06-25 MED ORDER — KETOROLAC TROMETHAMINE 30 MG/ML IJ SOLN
INTRAMUSCULAR | Status: AC
  Filled 2018-06-25: qty 1

## 2018-06-25 MED ORDER — LACTATED RINGERS IV SOLN
INTRAVENOUS | Status: DC
  Administered 2018-06-25: 09:00:00 via INTRAVENOUS

## 2018-06-25 MED ORDER — OXYCODONE HCL 5 MG PO TABS: 5.0000 mg | ORAL_TABLET | Freq: Once | ORAL | Status: DC | PRN

## 2018-06-25 MED ORDER — MIDAZOLAM HCL 5 MG/5ML IJ SOLN
INTRAMUSCULAR | Status: DC | PRN
  Administered 2018-06-25: 2 mg via INTRAVENOUS

## 2018-06-25 MED ORDER — FENTANYL CITRATE (PF) 250 MCG/5ML IJ SOLN
INTRAMUSCULAR | Status: AC
  Filled 2018-06-25: qty 5

## 2018-06-25 MED ORDER — ONDANSETRON HCL 4 MG/2ML IJ SOLN
INTRAMUSCULAR | Status: DC | PRN
  Administered 2018-06-25: 4 mg via INTRAVENOUS

## 2018-06-25 MED ORDER — FENTANYL CITRATE (PF) 100 MCG/2ML IJ SOLN
INTRAMUSCULAR | Status: AC
  Filled 2018-06-25: qty 2

## 2018-06-25 MED ORDER — CEFAZOLIN SODIUM-DEXTROSE 2-4 GM/100ML-% IV SOLN
2.0000 g | INTRAVENOUS | Status: AC
  Administered 2018-06-25: 2 g via INTRAVENOUS
  Filled 2018-06-25: qty 100

## 2018-06-25 MED ORDER — MIDAZOLAM HCL 2 MG/2ML IJ SOLN
INTRAMUSCULAR | Status: AC
  Filled 2018-06-25: qty 2

## 2018-06-25 MED ORDER — FENTANYL CITRATE (PF) 100 MCG/2ML IJ SOLN
25.0000 ug | INTRAMUSCULAR | Status: DC | PRN
  Administered 2018-06-25: 50 ug via INTRAVENOUS

## 2018-06-25 MED ORDER — OXYCODONE HCL 5 MG/5ML PO SOLN: 5.0000 mg | Freq: Once | ORAL | Status: DC | PRN

## 2018-06-25 MED ORDER — DEXAMETHASONE SODIUM PHOSPHATE 10 MG/ML IJ SOLN
INTRAMUSCULAR | Status: AC
  Filled 2018-06-25: qty 1

## 2018-06-25 MED ORDER — DEXAMETHASONE SODIUM PHOSPHATE 10 MG/ML IJ SOLN
INTRAMUSCULAR | Status: DC | PRN
  Administered 2018-06-25: 10 mg via INTRAVENOUS

## 2018-06-25 MED ORDER — PROPOFOL 10 MG/ML IV BOLUS
INTRAVENOUS | Status: AC
  Filled 2018-06-25: qty 20

## 2018-06-25 MED ORDER — ONDANSETRON HCL 4 MG/2ML IJ SOLN
INTRAMUSCULAR | Status: AC
  Filled 2018-06-25: qty 2

## 2018-06-25 MED ORDER — SODIUM CHLORIDE 0.9 % IR SOLN
Status: DC | PRN
  Administered 2018-06-25 (×2): 3000 mL

## 2018-06-25 MED ORDER — FENTANYL CITRATE (PF) 100 MCG/2ML IJ SOLN
INTRAMUSCULAR | Status: DC | PRN
  Administered 2018-06-25 (×3): 50 ug via INTRAVENOUS

## 2018-06-25 SURGICAL SUPPLY — 43 items
BLADE CUDA 5.5 (BLADE) IMPLANT
BLADE GREAT WHITE 4.2 (BLADE) IMPLANT
BLADE GREAT WHITE 4.2MM (BLADE)
BLADE INCISOR PLUS 5.5 (BLADE) IMPLANT
BNDG COHESIVE 6X5 TAN STRL LF (GAUZE/BANDAGES/DRESSINGS) ×3 IMPLANT
BNDG GAUZE ELAST 4 BULKY (GAUZE/BANDAGES/DRESSINGS) ×4 IMPLANT
BUR OVAL 4.0 (BURR) IMPLANT
COVER SURGICAL LIGHT HANDLE (MISCELLANEOUS) ×6 IMPLANT
CUFF TOURNIQUET SINGLE 34IN LL (TOURNIQUET CUFF) IMPLANT
CUFF TOURNIQUET SINGLE 44IN (TOURNIQUET CUFF) IMPLANT
DRAPE ARTHROSCOPY W/POUCH 114 (DRAPES) ×3 IMPLANT
DRAPE C-ARM MINI 42X72 WSTRAPS (DRAPES) IMPLANT
DRAPE U-SHAPE 47X51 STRL (DRAPES) ×3 IMPLANT
DRSG EMULSION OIL 3X3 NADH (GAUZE/BANDAGES/DRESSINGS) ×3 IMPLANT
DRSG PAD ABDOMINAL 8X10 ST (GAUZE/BANDAGES/DRESSINGS) ×3 IMPLANT
DURAPREP 26ML APPLICATOR (WOUND CARE) ×3 IMPLANT
GAUZE SPONGE 4X4 12PLY STRL (GAUZE/BANDAGES/DRESSINGS) ×3 IMPLANT
GLOVE BIOGEL PI IND STRL 9 (GLOVE) ×1 IMPLANT
GLOVE BIOGEL PI INDICATOR 9 (GLOVE) ×2
GLOVE SURG ORTHO 9.0 STRL STRW (GLOVE) ×3 IMPLANT
GOWN STRL REUS W/ TWL XL LVL3 (GOWN DISPOSABLE) ×3 IMPLANT
GOWN STRL REUS W/TWL XL LVL3 (GOWN DISPOSABLE) ×9
KIT BASIN OR (CUSTOM PROCEDURE TRAY) ×3 IMPLANT
KIT TURNOVER KIT B (KITS) ×3 IMPLANT
MANIFOLD NEPTUNE II (INSTRUMENTS) ×3 IMPLANT
NDL 18GX1X1/2 (RX/OR ONLY) (NEEDLE) ×1 IMPLANT
NEEDLE 18GX1X1/2 (RX/OR ONLY) (NEEDLE) ×3 IMPLANT
PACK ARTHROSCOPY DSU (CUSTOM PROCEDURE TRAY) ×3 IMPLANT
PAD ARMBOARD 7.5X6 YLW CONV (MISCELLANEOUS) ×6 IMPLANT
PADDING CAST COTTON 6X4 STRL (CAST SUPPLIES) ×3 IMPLANT
SET ARTHROSCOPY TUBING (MISCELLANEOUS) ×3
SET ARTHROSCOPY TUBING LN (MISCELLANEOUS) ×1 IMPLANT
SPONGE LAP 4X18 RFD (DISPOSABLE) ×3 IMPLANT
SUT ETHILON 4 0 PS 2 18 (SUTURE) ×3 IMPLANT
SUT MNCRL AB 3-0 PS2 18 (SUTURE) IMPLANT
SUT VIC AB 2-0 CT1 27 (SUTURE)
SUT VIC AB 2-0 CT1 TAPERPNT 27 (SUTURE) IMPLANT
SYR 20CC LL (SYRINGE) ×3 IMPLANT
TAPE STRIPS DRAPE STRL (GAUZE/BANDAGES/DRESSINGS) IMPLANT
TOWEL OR 17X24 6PK STRL BLUE (TOWEL DISPOSABLE) ×3 IMPLANT
TOWEL OR 17X26 10 PK STRL BLUE (TOWEL DISPOSABLE) ×3 IMPLANT
WAND HAND CNTRL MULTIVAC 90 (MISCELLANEOUS) IMPLANT
WATER STERILE IRR 1000ML POUR (IV SOLUTION) ×3 IMPLANT

## 2018-06-25 NOTE — Progress Notes (Signed)
Orthopedic Tech Progress Note Patient Details:  Gwynneth Alimentmy L Gladden 05/31/1971 [id number]  Ortho Devices Type of Ortho Device: Crutches, Postop shoe/boot Ortho Device/Splint Location: lle Ortho Device/Splint Interventions: Application   Post Interventions Patient Tolerated: Well Instructions Provided: Care of device Viewed order from doctor's order list  Nikki DomCrawford, Tena Linebaugh 06/25/2018, 1:14 PM

## 2018-06-25 NOTE — Op Note (Signed)
06/25/2018  10:10 AM  PATIENT:  [name]    PRE-OPERATIVE DIAGNOSIS:  Traumatic Arthritis Left Ankle  POST-OPERATIVE DIAGNOSIS:  Same  PROCEDURE:  LEFT ANKLE ARTHROSCOPY with extensive debridement.  SURGEON:  Nadara MustardMarcus V Isla Sabree, MD  PHYSICIAN ASSISTANT:None ANESTHESIA:   General  PREOPERATIVE INDICATIONS:  [name] is a  47 y.o. female with a diagnosis of Traumatic Arthritis Left Ankle who failed conservative measures and elected for surgical management.    The risks benefits and alternatives were discussed with the patient preoperatively including but not limited to the risks of infection, bleeding, nerve injury, cardiopulmonary complications, the need for revision surgery, among others, and the patient was willing to proceed.  OPERATIVE IMPLANTS: None  @ENCIMAGES @  OPERATIVE FINDINGS: Traumatic arthritis with synovitis  OPERATIVE PROCEDURE: Patient was brought the operating room and underwent a general anesthetic.  After adequate levels of anesthesia were obtained patient's left lower extremity was prepped using DuraPrep draped into a sterile field a timeout was called.  Patient received preoperative antibiotics.  The scope was inserted through the anterior medial portal and anterior lateral portal was established for working portal.  The skin was incised blunt dissection was carried down to the capsule and a blunt trocar was used to insert into the capsule.  Visualization showed extensive synovitis with impingement anteriorly.  Using the electrical wand and the shaver patient underwent debridement of the synovitis the medial and lateral gutters were cleared anteriorly this was cleared bony spurs anteriorly were resected.  Patient had congruent tibiotalar joint with no osteochondral defects.  This was removed the portals were closed using 2-0 nylon a sterile dressing was applied patient was extubated taken the PACU in stable condition.   DISCHARGE PLANNING:  Antibiotic duration:  Preoperative  Weightbearing: Weightbearing as tolerated  Pain medication: Prescription for Percocet  Dressing care/ Wound VAC: Dry dressing leave intact until follow-up  Ambulatory devices: Crutches or walker  Discharge to: Home.  Follow-up: In the office 1 week post operative.

## 2018-06-25 NOTE — Anesthesia Preprocedure Evaluation (Addendum)
Anesthesia Evaluation  Patient identified by MRN, date of birth, ID band Patient awake    Reviewed: Allergy & Precautions, H&P , NPO status , Patient's Chart, lab work & pertinent test results  History of Anesthesia Complications Negative for: history of anesthetic complications  Airway Mallampati: II  TM Distance: >3 FB Neck ROM: Full    Dental  (+) Poor Dentition, Loose, Dental Advisory Given,    Pulmonary shortness of breath, COPD, neg recent URI (hoarseness), Current Smoker,    breath sounds clear to auscultation       Cardiovascular negative cardio ROS Normal cardiovascular exam Rhythm:Regular Rate:Normal     Neuro/Psych PSYCHIATRIC DISORDERS Anxiety Depression negative neurological ROS     GI/Hepatic GERD  Medicated and Poorly Controlled,(+)     substance abuse  ,   Endo/Other  Morbid obesitySLE: steroids  Renal/GU negative Renal ROS     Musculoskeletal  (+) Arthritis , Osteoarthritis,    Abdominal (+) + obese,   Peds  Hematology negative hematology ROS (+)   Anesthesia Other Findings   Reproductive/Obstetrics                             Anesthesia Physical Anesthesia Plan  ASA: III  Anesthesia Plan: General   Post-op Pain Management:    Induction: Intravenous  PONV Risk Score and Plan: 2 and Ondansetron and Dexamethasone  Airway Management Planned: LMA and Oral ETT  Additional Equipment: None  Intra-op Plan:   Post-operative Plan: Extubation in OR  Informed Consent: I have reviewed the patients History and Physical, chart, labs and discussed the procedure including the risks, benefits and alternatives for the proposed anesthesia with the patient or authorized representative who has indicated his/her understanding and acceptance.   Dental advisory given  Plan Discussed with: CRNA and Surgeon  Anesthesia Plan Comments:        Anesthesia Quick Evaluation
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
## 2018-06-25 NOTE — Transfer of Care (Signed)
Immediate Anesthesia Transfer of Care Note  Patient: [name]  Procedure(s) Performed: LEFT ANKLE ARTHROSCOPY (Left )  Patient Location: PACU  Anesthesia Type:General  Level of Consciousness: drowsy  Airway & Oxygen Therapy: Patient Spontanous Breathing and Patient connected to face mask oxygen  Post-op Assessment: Report given to RN and Post -op Vital signs reviewed and stable  Post vital signs: Reviewed and stable  Last Vitals:  Vitals Value Taken Time  BP 117/74 06/25/2018 10:16 AM  Temp 36.2 C 06/25/2018 10:15 AM  Pulse 77 06/25/2018 10:17 AM  Resp 17 06/25/2018 10:17 AM  SpO2 96 % 06/25/2018 10:17 AM  Vitals shown include unvalidated device data.  Last Pain:  Vitals:   06/25/18 0816  PainSc: 7       Patients Stated Pain Goal: 4 (06/25/18 0816)  Complications: No apparent anesthesia complications

## 2018-06-25 NOTE — Anesthesia Procedure Notes (Signed)
Procedure Name: LMA Insertion Date/Time: 06/25/2018 9:36 AM Performed by: Marny Lowensteinapozzi, Lorrane Mccay W, CRNA Pre-anesthesia Checklist: Patient identified, Emergency Drugs available, Suction available, Patient being monitored and Timeout performed Patient Re-evaluated:Patient Re-evaluated prior to induction Oxygen Delivery Method: Circle system utilized Preoxygenation: Pre-oxygenation with 100% oxygen Induction Type: IV induction Ventilation: Mask ventilation without difficulty LMA: LMA inserted LMA Size: 4.0 Number of attempts: 1 Airway Equipment and Method: Patient positioned with wedge pillow Placement Confirmation: positive ETCO2,  CO2 detector and breath sounds checked- equal and bilateral Tube secured with: Tape Dental Injury: Teeth and Oropharynx as per pre-operative assessment

## 2018-06-26 ENCOUNTER — Encounter (HOSPITAL_COMMUNITY): Payer: Self-pay | Admitting: Orthopedic Surgery

## 2018-06-27 NOTE — Anesthesia Postprocedure Evaluation (Signed)
Anesthesia Post Note  Patient: [name]  Procedure(s) Performed: LEFT ANKLE ARTHROSCOPY (Left )     Patient location during evaluation: PACU Anesthesia Type: General Level of consciousness: awake and alert Pain management: pain level controlled Vital Signs Assessment: post-procedure vital signs reviewed and stable Respiratory status: spontaneous breathing, nonlabored ventilation, respiratory function stable and patient connected to nasal cannula oxygen Cardiovascular status: blood pressure returned to baseline and stable Postop Assessment: no apparent nausea or vomiting Anesthetic complications: no    Last Vitals:  Vitals:   06/25/18 1258 06/25/18 1300  BP: 101/67   Pulse: 73 69  Resp: 15 17  Temp:  (!) 36.3 C  SpO2: 92% 93%    Last Pain:  Vitals:   06/25/18 1230  PainSc: Asleep                 Terri Malerba

## 2018-07-02 ENCOUNTER — Encounter (INDEPENDENT_AMBULATORY_CARE_PROVIDER_SITE_OTHER): Payer: Self-pay | Admitting: Family

## 2018-07-02 ENCOUNTER — Ambulatory Visit (INDEPENDENT_AMBULATORY_CARE_PROVIDER_SITE_OTHER): Payer: Medicaid Other | Admitting: Family

## 2018-07-02 VITALS — Ht 63.0 in | Wt 197.0 lb

## 2018-07-02 DIAGNOSIS — M12572 Traumatic arthropathy, left ankle and foot: Secondary | ICD-10-CM

## 2018-07-02 MED ORDER — OXYCODONE-ACETAMINOPHEN 5-325 MG PO TABS: 1.0000 | ORAL_TABLET | Freq: Four times a day (QID) | ORAL | 0 refills | Status: DC | PRN

## 2018-07-02 NOTE — Progress Notes (Signed)
Post-Op Visit Note   Patient: [name]           Date of Birth: 08/05/1971           MRN: [id number] Visit Date: 07/02/2018 PCP: Patient, No Pcp Per  Chief Complaint:  Chief Complaint  Patient presents with  . Left Ankle - Routine Post Op    06/25/18 left ankle scope and debridement     HPI:  HPI  The patient is a 47 year old woman who presents today 1 week status post left ankle arthroscopy.  She complains of pain and soreness swelling there is sutures are in place.  She is requesting a refill on her pain medication.  States I think I am just going to need an ankle fusion.  Ortho Exam Portals are clean and dry there is no drainage no sign of infection.  Does have moderate swelling to her ankle.  Visit Diagnoses:  1. Traumatic arthritis of left ankle     Plan: Sutures harvested.  Patient to begin working on range of motion of the ankle activities as tolerated to follow-up in 2 weeks with Dr. Lajoyce Cornersuda.  Follow-Up Instructions: Return in about 2 weeks (around 07/16/2018).   Imaging: No results found.  Orders:  No orders of the defined types were placed in this encounter.  Meds ordered this encounter  Medications  . oxyCODONE-acetaminophen (PERCOCET/ROXICET) 5-325 MG tablet    Sig: Take 1 tablet by mouth every 6 (six) hours as needed.    Dispense:  28 tablet    Refill:  0     PMFS History: Patient Active Problem List   Diagnosis Date Noted  . Traumatic arthritis of left ankle   . Rash and nonspecific skin eruption 10/06/2013  . URI, acute 10/06/2013  . Obesity, unspecified 10/06/2013  . Tobacco use disorder 10/05/2013  . Hoarseness, chronic 09/05/2013  . 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 Bipolar disorder (HCC)   . Polysubstance abuse (HCC) 09/13/2012  . Anxiety and depression 07/19/2012  . 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 Anxiety    Panic attacks  . Arthritis   . Bipolar disorder (HCC) 01/21/13   . Chronic pain   . COPD (chronic obstructive pulmonary disease) (HCC)   . Depression   . Drug-seeking behavior   . Dyspnea    with much activity  . GERD (gastroesophageal reflux disease)   . Head injury with loss of consciousness (HCC)   . 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 Lupus (HCC)   . Lupus (HCC)   . MRSA (methicillin resistant Staphylococcus aureus)   . Tobacco abuse     Family History  Problem Relation Age of Onset  . Stroke Mother     Past Surgical History:  Procedure Laterality Date  . ANKLE ARTHROSCOPY Left 06/25/2018   Procedure: LEFT ANKLE ARTHROSCOPY;  Surgeon: Nadara Mustarduda, Marcus V, MD;  Location[facility];  Service: Orthopedics;  Laterality: Left;  . ANKLE SURGERY Left   . CHOLECYSTECTOMY    . HARDWARE REMOVAL  09/25/2012   Procedure: HARDWARE REMOVAL;  Surgeon: Kerrin ChampagneJames E Nitka, MD;  [facility];  Service: Orthopedics;  Laterality: Left;  Incision, drainage and debridement of left lateral ankle, removal of plate and screws  . TUBAL LIGATION     Social History   Occupational History  . Not on file  Tobacco Use  . Smoking status: Current Every Day Smoker    Packs/day: 0.50  Years: 37.00    Pack years: 18.50    Types: Cigarettes  . Smokeless tobacco: Never Used  Substance and Sexual Activity  . Alcohol use: No  . Drug use: Yes    Types: Marijuana    Comment: 1-2 times a day "none 06/24/18"  . Sexual activity: Yes

## 2018-07-16 ENCOUNTER — Encounter (INDEPENDENT_AMBULATORY_CARE_PROVIDER_SITE_OTHER): Payer: Self-pay

## 2018-07-16 ENCOUNTER — Ambulatory Visit (INDEPENDENT_AMBULATORY_CARE_PROVIDER_SITE_OTHER): Payer: Medicaid Other | Admitting: Orthopedic Surgery

## 2018-07-22 NOTE — Progress Notes (Signed)
Office Visit Note   Patient: [name]           Date of Birth: 1971/06/18           MRN: [id number] Visit Date: 04/24/2018              Requested by: No referring provider defined for this encounter. PCP: Patient, No Pcp Per   Assessment & Plan: Visit Diagnoses:  1. Pain in left ankle and joints of left foot   2. Arthritis of left ankle     Plan: We will refer patient to Dr. Lajoyce Corners to discuss treatment options for her ankle.  Follow-up with me as needed.  Follow-Up Instructions: Return in about 2 weeks (around 05/08/2018) for with Dr Lajoyce Corners for left ankle eval.  question fusion. .   Orders:  Orders Placed This Encounter  Procedures  . XR Ankle Complete Left   No orders of the defined types were placed in this encounter.     Procedures: No procedures performed   Clinical Data: No additional findings.   Subjective: Chief Complaint  Patient presents with  . Left Ankle - Pain    HPI Patient comes in today with complaints of left ankle pain.  She is had previous ORIF left ankle with hardware removal.  She has a couple of screws remaining medial ankle.  States that she has been having more pain and swelling with weightbearing.  No new injury.  No improvement with oral NSAIDs. Review of Systems No current cardiac pulmonary GI GU issues  Objective: Vital Signs: There were no vitals taken for this visit.  Physical Exam  Constitutional: No distress.  HENT:  Head: Normocephalic and atraumatic.  Eyes: EOM are normal.  Pulmonary/Chest: No respiratory distress.  Musculoskeletal:  Does have some decreased range of motion left ankle.  No swelling.  No bruising.  Ankle joint tender.  No signs of infection.  Calf nontender.  Neurovascular intact.  Neurological: She is alert.  Skin: Skin is warm and dry.    Ortho Exam  Specialty Comments:  No specialty comments available.  Imaging: No results found.   PMFS History: Patient Active Problem List   Diagnosis Date  Noted  . Traumatic arthritis of left ankle   . Rash and nonspecific skin eruption 10/06/2013  . URI, acute 10/06/2013  . Obesity, unspecified 10/06/2013  . Tobacco use disorder 10/05/2013  . Hoarseness, chronic 09/05/2013  . 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 Bipolar disorder (HCC)   . Polysubstance abuse (HCC) 09/13/2012  . Anxiety and depression 07/19/2012  . 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 Anxiety    Panic attacks  . Arthritis   . Bipolar disorder (HCC) 01/21/13  . Chronic pain   . COPD (chronic obstructive pulmonary disease) (HCC)   . Depression   . Drug-seeking behavior   . Dyspnea    with much activity  . GERD (gastroesophageal reflux disease)   . Head injury with loss of consciousness (HCC)   . 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 Lupus (HCC)   . Lupus (HCC)   . MRSA (methicillin resistant Staphylococcus aureus)   . Tobacco abuse     Family History  Problem Relation Age of Onset  . Stroke Mother     Past Surgical History:  Procedure Laterality Date  . ANKLE ARTHROSCOPY Left 06/25/2018   Procedure: LEFT ANKLE ARTHROSCOPY;  Surgeon: Nadara Mustard, MD;  [facility];  Service:  Orthopedics;  Laterality: Left;  . ANKLE SURGERY Left   . CHOLECYSTECTOMY    . HARDWARE REMOVAL  09/25/2012   Procedure: HARDWARE REMOVAL;  Surgeon: Kerrin Champagne, MD;  Location: [facility] OR;  Service: Orthopedics;  Laterality: Left;  Incision, drainage and debridement of left lateral ankle, removal of plate and screws  . TUBAL LIGATION     Social History   Occupational History  . Not on file  Tobacco Use  . Smoking status: Current Every Day Smoker    Packs/day: 0.50    Years: 37.00    Pack years: 18.50    Types: Cigarettes  . Smokeless tobacco: Never Used  Substance and Sexual Activity  . Alcohol use: No  . Drug use: Yes    Types: Marijuana    Comment: 1-2 times a day "none 06/24/18"  . Sexual activity: Yes

## 2018-08-06 ENCOUNTER — Ambulatory Visit (INDEPENDENT_AMBULATORY_CARE_PROVIDER_SITE_OTHER): Payer: Medicaid Other | Admitting: Orthopedic Surgery

## 2018-08-13 ENCOUNTER — Encounter (INDEPENDENT_AMBULATORY_CARE_PROVIDER_SITE_OTHER): Payer: Self-pay | Admitting: Family

## 2018-08-13 ENCOUNTER — Ambulatory Visit (INDEPENDENT_AMBULATORY_CARE_PROVIDER_SITE_OTHER): Payer: Medicaid Other | Admitting: Family

## 2018-08-13 VITALS — Ht 63.0 in | Wt 197.0 lb

## 2018-08-13 DIAGNOSIS — M12572 Traumatic arthropathy, left ankle and foot: Secondary | ICD-10-CM

## 2018-08-15 ENCOUNTER — Encounter (INDEPENDENT_AMBULATORY_CARE_PROVIDER_SITE_OTHER): Payer: Self-pay | Admitting: Family

## 2018-08-15 NOTE — Progress Notes (Signed)
Post-Op Visit Note   Patient: [name]           Date of Birth: [birth year]           MRN: [id number] Visit Date: 08/13/2018 PCP: Patient, No Pcp Per  Chief Complaint:  Chief Complaint  Patient presents with  . Left Ankle - Routine Post Op    HPI:  HPI  The patient is a 47 year old woman who presents today 3 weeks status post left ankle arthroscopy.  She complains of mild pain and soreness with swelling.  Has only been taking Tylenol for pain.  States she is able to go walking daily and regular shoewear.  Is pleased with her improvement since last visit.   Ortho Exam Portals are well-healed.  Does have moderate swelling to her ankle.  Anterior joint line is minimally tender to palpation.  No ligamentous or bony tenderness.  Visit Diagnoses:  1. Traumatic arthritis of left ankle     Plan: She will continue working on range of motion of the ankle activities as tolerated to follow-up in 4 weeks with Dr. Lajoyce Corners if pain persists and symptoms do not improve as expected.  Follow-Up Instructions: No follow-ups on file.   Imaging: No results found.  Orders:  No orders of the defined types were placed in this encounter.  No orders of the defined types were placed in this encounter.    PMFS History: Patient Active Problem List   Diagnosis Date Noted  . Traumatic arthritis of left ankle   . Rash and nonspecific skin eruption 10/06/2013  . URI, acute 10/06/2013  . Obesity, unspecified 10/06/2013  . Tobacco use disorder 10/05/2013  . Hoarseness, chronic 09/05/2013  . 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 Bipolar disorder (HCC)   . Polysubstance abuse (HCC) 09/13/2012  . Anxiety and depression 07/19/2012  . 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 Anxiety    Panic attacks  . Arthritis   . Bipolar disorder (HCC) 01/21/13  . Chronic pain   . COPD (chronic obstructive pulmonary disease) (HCC)   . Depression   .  Drug-seeking behavior   . Dyspnea    with much activity  . GERD (gastroesophageal reflux disease)   . Head injury with loss of consciousness (HCC)   . 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 Lupus (HCC)   . Lupus (HCC)   . MRSA (methicillin resistant Staphylococcus aureus)   . Tobacco abuse     Family History  Problem Relation Age of Onset  . Stroke Mother     Past Surgical History:  Procedure Laterality Date  . ANKLE ARTHROSCOPY Left 06/25/2018   Procedure: LEFT ANKLE ARTHROSCOPY;  Surgeon: Nadara Mustard, MD;  [facility] OR;  Service: Orthopedics;  Laterality: Left;  . ANKLE SURGERY Left   . CHOLECYSTECTOMY    . HARDWARE REMOVAL  09/25/2012   Procedure: HARDWARE REMOVAL;  Surgeon: Kerrin Champagne, MD;  Location: [facility] OR;  Service: Orthopedics;  Laterality: Left;  Incision, drainage and debridement of left lateral ankle, removal of plate and screws  . TUBAL LIGATION     Social History   Occupational History  . Not on file  Tobacco Use  . Smoking status: Current Every Day Smoker    Packs/day: 0.50    Years: 37.00    Pack years: 18.50    Types: Cigarettes  . Smokeless tobacco: Never Used  Substance and Sexual Activity  . Alcohol use: No  .  Drug use: Yes    Types: Marijuana    Comment: 1-2 times a day "none 06/24/18"  . Sexual activity: Yes

## 2019-04-27 ENCOUNTER — Ambulatory Visit (INDEPENDENT_AMBULATORY_CARE_PROVIDER_SITE_OTHER): Payer: Medicaid Other

## 2019-04-27 ENCOUNTER — Ambulatory Visit (INDEPENDENT_AMBULATORY_CARE_PROVIDER_SITE_OTHER): Payer: Medicaid Other | Admitting: Physician Assistant

## 2019-04-27 ENCOUNTER — Other Ambulatory Visit: Payer: Self-pay

## 2019-04-27 ENCOUNTER — Encounter: Payer: Self-pay | Admitting: Orthopedic Surgery

## 2019-04-27 VITALS — Ht 63.0 in | Wt 197.0 lb

## 2019-04-27 DIAGNOSIS — M19172 Post-traumatic osteoarthritis, left ankle and foot: Secondary | ICD-10-CM

## 2019-04-27 DIAGNOSIS — M25572 Pain in left ankle and joints of left foot: Secondary | ICD-10-CM
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
## 2019-04-28 ENCOUNTER — Encounter: Payer: Self-pay | Admitting: Physician Assistant

## 2019-04-28 MED ORDER — LIDOCAINE HCL 1 % IJ SOLN
2.0000 mL | INTRAMUSCULAR | Status: AC | PRN
  Administered 2019-04-27: 2 mL

## 2019-04-28 MED ORDER — METHYLPREDNISOLONE ACETATE 40 MG/ML IJ SUSP
40.0000 mg | INTRAMUSCULAR | Status: AC | PRN
  Administered 2019-04-27: 40 mg via INTRA_ARTICULAR

## 2019-05-12 ENCOUNTER — Other Ambulatory Visit (HOSPITAL_COMMUNITY)
Admission: RE | Admit: 2019-05-12 | Discharge: 2019-05-12 | Disposition: A | Discharge status: Home / Self Care | Payer: Medicaid Other | Source: Ambulatory Visit | Attending: Orthopedic Surgery | Admitting: Orthopedic Surgery

## 2019-05-12 DIAGNOSIS — Z1159 Encounter for screening for other viral diseases: Secondary | ICD-10-CM | POA: Insufficient documentation

## 2019-05-12 LAB — SARS CORONAVIRUS 2 (TAT 6-24 HRS): SARS Coronavirus 2: NEGATIVE

## 2019-05-14 NOTE — Progress Notes (Signed)
I was unable to reach patient by phone, tried all numbers. I left  A message on voice mail on the 2 numbers with voice mail.  I instructed the patient to arrive at [facility] entrance at 5:30 AM , register in the [facility]. DO NOT eat anything after midnight. . I instructed patient that she may drink clear liquids until 4:30 AM; Carbonated Beverages, Water, Clear Tea, Black Coffee only, Juice (non-citric and without pulp), Gatorade.   I instructed the patient to take the following medications in the am with just enough water to get them down: Escitalopram, Gabapentin, Claritin, Omeprazole, Use Symbicort.  Prn -Dynegy- bring it with yoy I asked patient to not wear any lotions, powders, cologne, jewelry, piercing, make-up or nail polish, deodorant..  Wear clean clothes. Brush teeth. I informed patient that there will need to be a driver and someone to stay with him/her for the first 24 hours after surgery.   I instructed  patient to call [phone], in the am if there were any questions or problems.

## 2019-05-15 ENCOUNTER — Observation Stay (HOSPITAL_COMMUNITY)
Admission: RE | Admit: 2019-05-15 | Discharge: 2019-05-17 | Disposition: A | Discharge status: Home / Self Care | Payer: Medicaid Other | Attending: Orthopedic Surgery | Admitting: Orthopedic Surgery

## 2019-05-15 ENCOUNTER — Other Ambulatory Visit: Payer: Self-pay

## 2019-05-15 ENCOUNTER — Ambulatory Visit (HOSPITAL_COMMUNITY): Payer: Medicaid Other | Admitting: Registered Nurse

## 2019-05-15 ENCOUNTER — Encounter (HOSPITAL_COMMUNITY): Payer: Self-pay

## 2019-05-15 ENCOUNTER — Encounter (HOSPITAL_COMMUNITY)
Admission: RE | Disposition: A | Discharge status: Home / Self Care | Payer: Self-pay | Source: Home / Self Care | Attending: Orthopedic Surgery

## 2019-05-15 DIAGNOSIS — J449 Chronic obstructive pulmonary disease, unspecified: Secondary | ICD-10-CM | POA: Insufficient documentation

## 2019-05-15 DIAGNOSIS — Z79899 Other long term (current) drug therapy: Secondary | ICD-10-CM | POA: Insufficient documentation

## 2019-05-15 DIAGNOSIS — G8929 Other chronic pain: Secondary | ICD-10-CM | POA: Diagnosis not present

## 2019-05-15 DIAGNOSIS — F17210 Nicotine dependence, cigarettes, uncomplicated: Secondary | ICD-10-CM | POA: Diagnosis not present

## 2019-05-15 DIAGNOSIS — Z7951 Long term (current) use of inhaled steroids: Secondary | ICD-10-CM | POA: Diagnosis not present

## 2019-05-15 DIAGNOSIS — M329 Systemic lupus erythematosus, unspecified: Secondary | ICD-10-CM | POA: Insufficient documentation

## 2019-05-15 DIAGNOSIS — M19172 Post-traumatic osteoarthritis, left ankle and foot: Secondary | ICD-10-CM | POA: Diagnosis not present

## 2019-05-15 DIAGNOSIS — Z981 Arthrodesis status: Secondary | ICD-10-CM

## 2019-05-15 DIAGNOSIS — M85872 Other specified disorders of bone density and structure, left ankle and foot: Secondary | ICD-10-CM | POA: Diagnosis not present

## 2019-05-15 DIAGNOSIS — F419 Anxiety disorder, unspecified: Secondary | ICD-10-CM | POA: Insufficient documentation

## 2019-05-15 DIAGNOSIS — Z79891 Long term (current) use of opiate analgesic: Secondary | ICD-10-CM | POA: Diagnosis not present

## 2019-05-15 DIAGNOSIS — K219 Gastro-esophageal reflux disease without esophagitis: Secondary | ICD-10-CM | POA: Insufficient documentation

## 2019-05-15 DIAGNOSIS — F319 Bipolar disorder, unspecified: Secondary | ICD-10-CM | POA: Insufficient documentation

## 2019-05-15 DIAGNOSIS — M12572 Traumatic arthropathy, left ankle and foot: Principal | ICD-10-CM | POA: Insufficient documentation

## 2019-05-15 HISTORY — PX: ANKLE FUSION: SHX5718

## 2019-05-15 HISTORY — PX: APPLICATION OF WOUND VAC: SHX5189

## 2019-05-15 LAB — BASIC METABOLIC PANEL
Anion gap: 11 (ref 5–15)
BUN: 9 mg/dL (ref 6–20)
CO2: 22 mmol/L (ref 22–32)
Calcium: 9.2 mg/dL (ref 8.9–10.3)
Chloride: 106 mmol/L (ref 98–111)
Creatinine, Ser: 0.95 mg/dL (ref 0.44–1.00)
GFR calc Af Amer: >60 mL/min (ref >60)
GFR calc non Af Amer: >60 mL/min (ref >60)
Glucose, Bld: 102 mg/dL — ABNORMAL HIGH (ref 70–99)
Potassium: 3.9 mmol/L (ref 3.5–5.1)
Sodium: 139 mmol/L (ref 135–145)

## 2019-05-15 LAB — CBC
HCT: 45.4 % (ref 36.0–46.0)
Hemoglobin: 14.6 g/dL (ref 12.0–15.0)
MCH: 28.2 pg (ref 26.0–34.0)
MCHC: 32.2 g/dL (ref 30.0–36.0)
MCV: 87.6 fL (ref 80.0–100.0)
Platelets: 249 10*3/uL (ref 150–400)
RBC: 5.18 MIL/uL — ABNORMAL HIGH (ref 3.87–5.11)
RDW: 14.5 % (ref 11.5–15.5)
WBC: 8.6 10*3/uL (ref 4.0–10.5)
nRBC: 0 % (ref 0.0–0.2)

## 2019-05-15 LAB — POCT PREGNANCY, URINE: Preg Test, Ur: NEGATIVE

## 2019-05-15 SURGERY — ANKLE FUSION
Anesthesia: General | Site: Ankle | Laterality: Left

## 2019-05-15 MED ORDER — PROPOFOL 10 MG/ML IV BOLUS
INTRAVENOUS | Status: AC
  Filled 2019-05-15: qty 20

## 2019-05-15 MED ORDER — ONDANSETRON HCL 4 MG PO TABS
4.0000 mg | ORAL_TABLET | Freq: Four times a day (QID) | ORAL | Status: DC | PRN
  Administered 2019-05-16: 4 mg via ORAL
  Filled 2019-05-15: qty 1

## 2019-05-15 MED ORDER — BUPIVACAINE HCL (PF) 0.5 % IJ SOLN
INTRAMUSCULAR | Status: DC | PRN
  Administered 2019-05-15: 30 mL via PERINEURAL

## 2019-05-15 MED ORDER — UMECLIDINIUM BROMIDE 62.5 MCG/INH IN AEPB
1.0000 | INHALATION_SPRAY | Freq: Every day | RESPIRATORY_TRACT | Status: DC
  Administered 2019-05-15: 1 via RESPIRATORY_TRACT
  Filled 2019-05-15: qty 7

## 2019-05-15 MED ORDER — ACETAMINOPHEN 325 MG PO TABS: 325.0000 mg | ORAL_TABLET | Freq: Four times a day (QID) | ORAL | 0 refills | Status: AC | PRN

## 2019-05-15 MED ORDER — MIDAZOLAM HCL 2 MG/2ML IJ SOLN
INTRAMUSCULAR | Status: AC
  Filled 2019-05-15: qty 2

## 2019-05-15 MED ORDER — FENTANYL CITRATE (PF) 250 MCG/5ML IJ SOLN
INTRAMUSCULAR | Status: AC
  Filled 2019-05-15: qty 5

## 2019-05-15 MED ORDER — EPHEDRINE 5 MG/ML INJ
INTRAVENOUS | Status: AC
  Filled 2019-05-15: qty 10

## 2019-05-15 MED ORDER — SUGAMMADEX SODIUM 200 MG/2ML IV SOLN
INTRAVENOUS | Status: DC | PRN
  Administered 2019-05-15: 200 mg via INTRAVENOUS

## 2019-05-15 MED ORDER — PROMETHAZINE HCL 25 MG/ML IJ SOLN: 6.2500 mg | INTRAMUSCULAR | Status: DC | PRN

## 2019-05-15 MED ORDER — FENTANYL CITRATE (PF) 250 MCG/5ML IJ SOLN
INTRAMUSCULAR | Status: DC | PRN
  Administered 2019-05-15 (×2): 50 ug via INTRAVENOUS

## 2019-05-15 MED ORDER — PROPOFOL 10 MG/ML IV BOLUS
INTRAVENOUS | Status: DC | PRN
  Administered 2019-05-15: 100 mg via INTRAVENOUS

## 2019-05-15 MED ORDER — METOCLOPRAMIDE HCL 5 MG PO TABS: 5.0000 mg | ORAL_TABLET | Freq: Three times a day (TID) | ORAL | Status: DC | PRN

## 2019-05-15 MED ORDER — CEFAZOLIN SODIUM-DEXTROSE 2-4 GM/100ML-% IV SOLN
2.0000 g | INTRAVENOUS | Status: AC
  Administered 2019-05-15: 2 g via INTRAVENOUS
  Filled 2019-05-15: qty 100

## 2019-05-15 MED ORDER — MIDAZOLAM HCL 5 MG/5ML IJ SOLN
INTRAMUSCULAR | Status: DC | PRN
  Administered 2019-05-15 (×2): 1 mg via INTRAVENOUS

## 2019-05-15 MED ORDER — OXYCODONE HCL 5 MG/5ML PO SOLN: 5.0000 mg | Freq: Once | ORAL | Status: DC | PRN

## 2019-05-15 MED ORDER — SUCCINYLCHOLINE CHLORIDE 200 MG/10ML IV SOSY
PREFILLED_SYRINGE | INTRAVENOUS | Status: DC | PRN
  Administered 2019-05-15: 80 mg via INTRAVENOUS

## 2019-05-15 MED ORDER — DEXAMETHASONE SODIUM PHOSPHATE 10 MG/ML IJ SOLN
INTRAMUSCULAR | Status: DC | PRN
  Administered 2019-05-15: 10 mg via INTRAVENOUS

## 2019-05-15 MED ORDER — POVIDONE-IODINE 10 % EX SWAB: 2.0000 "application " | Freq: Once | CUTANEOUS | Status: DC

## 2019-05-15 MED ORDER — ACETAMINOPHEN 325 MG PO TABS
325.0000 mg | ORAL_TABLET | Freq: Four times a day (QID) | ORAL | Status: DC | PRN
  Administered 2019-05-16: 650 mg via ORAL
  Filled 2019-05-15: qty 2

## 2019-05-15 MED ORDER — ENSURE PRE-SURGERY PO LIQD
296.0000 mL | Freq: Once | ORAL | Status: DC
  Filled 2019-05-15: qty 296

## 2019-05-15 MED ORDER — ROCURONIUM BROMIDE 10 MG/ML (PF) SYRINGE
PREFILLED_SYRINGE | INTRAVENOUS | Status: DC | PRN
  Administered 2019-05-15: 30 mg via INTRAVENOUS

## 2019-05-15 MED ORDER — BUPRENORPHINE HCL 8 MG SL SUBL
8.0000 mg | SUBLINGUAL_TABLET | Freq: Three times a day (TID) | SUBLINGUAL | Status: DC
  Administered 2019-05-15 – 2019-05-16 (×3): 8 mg via SUBLINGUAL
  Filled 2019-05-15 (×3): qty 1

## 2019-05-15 MED ORDER — METOCLOPRAMIDE HCL 5 MG/ML IJ SOLN: 5.0000 mg | Freq: Three times a day (TID) | INTRAMUSCULAR | Status: DC | PRN

## 2019-05-15 MED ORDER — ONDANSETRON HCL 4 MG/2ML IJ SOLN
INTRAMUSCULAR | Status: DC | PRN
  Administered 2019-05-15: 4 mg via INTRAVENOUS

## 2019-05-15 MED ORDER — PHENYLEPHRINE 40 MCG/ML (10ML) SYRINGE FOR IV PUSH (FOR BLOOD PRESSURE SUPPORT)
PREFILLED_SYRINGE | INTRAVENOUS | Status: AC
  Filled 2019-05-15: qty 10

## 2019-05-15 MED ORDER — DOCUSATE SODIUM 100 MG PO CAPS
100.0000 mg | ORAL_CAPSULE | Freq: Two times a day (BID) | ORAL | Status: DC
  Administered 2019-05-15 – 2019-05-16 (×3): 100 mg via ORAL
  Filled 2019-05-15 (×3): qty 1

## 2019-05-15 MED ORDER — MOMETASONE FURO-FORMOTEROL FUM 200-5 MCG/ACT IN AERO
2.0000 | INHALATION_SPRAY | Freq: Two times a day (BID) | RESPIRATORY_TRACT | Status: DC
  Administered 2019-05-15 – 2019-05-17 (×3): 2 via RESPIRATORY_TRACT
  Filled 2019-05-15: qty 8.8

## 2019-05-15 MED ORDER — ACETAMINOPHEN 325 MG PO TABS: 325.0000 mg | ORAL_TABLET | Freq: Four times a day (QID) | ORAL | Status: DC | PRN

## 2019-05-15 MED ORDER — DEXAMETHASONE SODIUM PHOSPHATE 10 MG/ML IJ SOLN
INTRAMUSCULAR | Status: AC
  Filled 2019-05-15: qty 4

## 2019-05-15 MED ORDER — HYDROCODONE-ACETAMINOPHEN 5-325 MG PO TABS
1.0000 | ORAL_TABLET | ORAL | Status: DC | PRN
  Administered 2019-05-15: 15:00:00 1 via ORAL
  Administered 2019-05-15: 2 via ORAL
  Administered 2019-05-16 – 2019-05-17 (×5): 1 via ORAL
  Filled 2019-05-15 (×4): qty 1
  Filled 2019-05-15: qty 2
  Filled 2019-05-15 (×3): qty 1

## 2019-05-15 MED ORDER — ALBUTEROL SULFATE (2.5 MG/3ML) 0.083% IN NEBU
3.0000 mL | INHALATION_SOLUTION | RESPIRATORY_TRACT | Status: DC | PRN
  Administered 2019-05-16: 3 mL via RESPIRATORY_TRACT
  Filled 2019-05-15: qty 3

## 2019-05-15 MED ORDER — LACTATED RINGERS IV SOLN
INTRAVENOUS | Status: DC | PRN
  Administered 2019-05-15: 07:00:00 via INTRAVENOUS

## 2019-05-15 MED ORDER — SUCCINYLCHOLINE CHLORIDE 200 MG/10ML IV SOSY
PREFILLED_SYRINGE | INTRAVENOUS | Status: AC
  Filled 2019-05-15: qty 10

## 2019-05-15 MED ORDER — ACETAMINOPHEN 500 MG PO TABS
500.0000 mg | ORAL_TABLET | Freq: Four times a day (QID) | ORAL | Status: AC
  Administered 2019-05-15 – 2019-05-16 (×2): 500 mg via ORAL
  Filled 2019-05-15 (×4): qty 1

## 2019-05-15 MED ORDER — LORATADINE 10 MG PO TABS
10.0000 mg | ORAL_TABLET | Freq: Every day | ORAL | Status: DC
  Administered 2019-05-15 – 2019-05-16 (×2): 10 mg via ORAL
  Filled 2019-05-15 (×2): qty 1

## 2019-05-15 MED ORDER — LIDOCAINE 2% (20 MG/ML) 5 ML SYRINGE
INTRAMUSCULAR | Status: AC
  Filled 2019-05-15: qty 5

## 2019-05-15 MED ORDER — OXYCODONE HCL 5 MG PO TABS: 5.0000 mg | ORAL_TABLET | Freq: Once | ORAL | Status: DC | PRN

## 2019-05-15 MED ORDER — ONDANSETRON HCL 4 MG/2ML IJ SOLN
INTRAMUSCULAR | Status: AC
  Filled 2019-05-15: qty 8

## 2019-05-15 MED ORDER — GABAPENTIN 300 MG PO CAPS
300.0000 mg | ORAL_CAPSULE | Freq: Three times a day (TID) | ORAL | Status: DC
  Administered 2019-05-15 – 2019-05-16 (×5): 300 mg via ORAL
  Filled 2019-05-15 (×5): qty 1

## 2019-05-15 MED ORDER — HYDROMORPHONE HCL 1 MG/ML IJ SOLN: 0.2500 mg | INTRAMUSCULAR | Status: DC | PRN

## 2019-05-15 MED ORDER — CHLORHEXIDINE GLUCONATE 4 % EX LIQD: 60.0000 mL | Freq: Once | CUTANEOUS | Status: DC

## 2019-05-15 MED ORDER — ROCURONIUM BROMIDE 10 MG/ML (PF) SYRINGE
PREFILLED_SYRINGE | INTRAVENOUS | Status: AC
  Filled 2019-05-15: qty 10

## 2019-05-15 MED ORDER — PANTOPRAZOLE SODIUM 40 MG PO TBEC
40.0000 mg | DELAYED_RELEASE_TABLET | Freq: Every day | ORAL | Status: DC
  Administered 2019-05-16: 40 mg via ORAL
  Filled 2019-05-15 (×2): qty 1

## 2019-05-15 MED ORDER — SODIUM CHLORIDE (PF) 0.9 % IJ SOLN
INTRAMUSCULAR | Status: DC | PRN
  Administered 2019-05-15: 4 mL

## 2019-05-15 MED ORDER — ESCITALOPRAM OXALATE 10 MG PO TABS
10.0000 mg | ORAL_TABLET | Freq: Every day | ORAL | Status: DC
  Administered 2019-05-15 – 2019-05-16 (×2): 10 mg via ORAL
  Filled 2019-05-15 (×2): qty 1

## 2019-05-15 MED ORDER — 0.9 % SODIUM CHLORIDE (POUR BTL) OPTIME
TOPICAL | Status: DC | PRN
  Administered 2019-05-15: 1000 mL

## 2019-05-15 MED ORDER — LIDOCAINE-EPINEPHRINE (PF) 1.5 %-1:200000 IJ SOLN
INTRAMUSCULAR | Status: DC | PRN
  Administered 2019-05-15: 15 mL via PERINEURAL

## 2019-05-15 MED ORDER — PHENYLEPHRINE 40 MCG/ML (10ML) SYRINGE FOR IV PUSH (FOR BLOOD PRESSURE SUPPORT)
PREFILLED_SYRINGE | INTRAVENOUS | Status: DC | PRN
  Administered 2019-05-15 (×2): 80 ug via INTRAVENOUS
  Administered 2019-05-15: 120 ug via INTRAVENOUS
  Administered 2019-05-15: 80 ug via INTRAVENOUS

## 2019-05-15 MED ORDER — ONDANSETRON HCL 4 MG/2ML IJ SOLN
4.0000 mg | Freq: Four times a day (QID) | INTRAMUSCULAR | Status: DC | PRN
  Filled 2019-05-15: qty 2

## 2019-05-15 MED ORDER — LIDOCAINE 2% (20 MG/ML) 5 ML SYRINGE
INTRAMUSCULAR | Status: DC | PRN
  Administered 2019-05-15: 80 mg via INTRAVENOUS

## 2019-05-15 SURGICAL SUPPLY — 52 items
BANDAGE ESMARK 6X9 LF (GAUZE/BANDAGES/DRESSINGS) ×1 IMPLANT
BIT DRILL CANN LNG FLUTE 3.0 (BIT) IMPLANT
BIT DRILL CANNULATED 3.0 (BIT) ×3
BIT DRILL SOLID LONG 5.5 (BIT) ×2 IMPLANT
BLADE AVERAGE 25MMX9MM (BLADE) ×1
BLADE AVERAGE 25X9 (BLADE) ×2 IMPLANT
BLADE SURG 10 STRL SS (BLADE) IMPLANT
BNDG CMPR 9X6 STRL LF SNTH (GAUZE/BANDAGES/DRESSINGS) ×1
BNDG COHESIVE 4X5 TAN STRL (GAUZE/BANDAGES/DRESSINGS) ×3 IMPLANT
BNDG ESMARK 6X9 LF (GAUZE/BANDAGES/DRESSINGS) ×3
BNDG GAUZE ELAST 4 BULKY (GAUZE/BANDAGES/DRESSINGS) ×4 IMPLANT
COVER MAYO STAND STRL (DRAPES) IMPLANT
COVER SURGICAL LIGHT HANDLE (MISCELLANEOUS) ×4 IMPLANT
COVER WAND RF STERILE (DRAPES) ×3 IMPLANT
DRAPE OEC MINIVIEW 54X84 (DRAPES) IMPLANT
DRAPE U-SHAPE 47X51 STRL (DRAPES) ×3 IMPLANT
DRSG ADAPTIC 3X8 NADH LF (GAUZE/BANDAGES/DRESSINGS) ×3 IMPLANT
DURAPREP 26ML APPLICATOR (WOUND CARE) ×3 IMPLANT
ELECT REM PT RETURN 9FT ADLT (ELECTROSURGICAL) ×3
ELECTRODE REM PT RTRN 9FT ADLT (ELECTROSURGICAL) ×1 IMPLANT
GAUZE SPONGE 4X4 12PLY STRL (GAUZE/BANDAGES/DRESSINGS) ×3 IMPLANT
GAUZE SPONGE 4X4 12PLY STRL LF (GAUZE/BANDAGES/DRESSINGS) ×2 IMPLANT
GLOVE BIOGEL PI IND STRL 9 (GLOVE) ×1 IMPLANT
GLOVE BIOGEL PI INDICATOR 9 (GLOVE) ×2
GLOVE SURG ORTHO 9.0 STRL STRW (GLOVE) ×3 IMPLANT
GOWN STRL REUS W/ TWL LRG LVL3 (GOWN DISPOSABLE) ×1 IMPLANT
GOWN STRL REUS W/ TWL XL LVL3 (GOWN DISPOSABLE) ×1 IMPLANT
GOWN STRL REUS W/TWL LRG LVL3 (GOWN DISPOSABLE) ×3
GOWN STRL REUS W/TWL XL LVL3 (GOWN DISPOSABLE) ×3
KIT BASIN OR (CUSTOM PROCEDURE TRAY) ×3 IMPLANT
KIT DRSG PREVENA PLUS 7DAY 125 (MISCELLANEOUS) ×2 IMPLANT
KIT PREVENA INCISION MGT 13 (CANNISTER) ×2 IMPLANT
KIT TURNOVER KIT B (KITS) ×3 IMPLANT
NS IRRIG 1000ML POUR BTL (IV SOLUTION) ×3 IMPLANT
PACK ORTHO EXTREMITY (CUSTOM PROCEDURE TRAY) ×3 IMPLANT
PAD ARMBOARD 7.5X6 YLW CONV (MISCELLANEOUS) ×6 IMPLANT
PLATE ANT ANKLE TT LT 4H (Plate) ×2 IMPLANT
PUTTY DBM ALLOSYNC PURE 10CC (Putty) ×2 IMPLANT
SCREW BONE LP TI 5.5X55 (Screw) ×2 IMPLANT
SCREW BONE TI LP 4.5X30 (Screw) ×2 IMPLANT
SCREW LOW PROFILE 4.5X28 (Screw) ×6 IMPLANT
SCREW LP TI 4.5X22 (Screw) ×2 IMPLANT
SCREW LP TI 5.5X60 (Screw) ×2 IMPLANT
SPONGE LAP 18X18 RF (DISPOSABLE) ×1 IMPLANT
SUCTION FRAZIER HANDLE 10FR (MISCELLANEOUS) ×2
SUCTION TUBE FRAZIER 10FR DISP (MISCELLANEOUS) ×1 IMPLANT
SUT ETHILON 2 0 PSLX (SUTURE) ×7 IMPLANT
TOWEL GREEN STERILE FF (TOWEL DISPOSABLE) ×3 IMPLANT
TOWEL OR 17X26 10 PK STRL BLUE (TOWEL DISPOSABLE) ×3 IMPLANT
TUBE CONNECTING 12'X1/4 (SUCTIONS) ×1
TUBE CONNECTING 12X1/4 (SUCTIONS) ×2 IMPLANT
WATER STERILE IRR 1000ML POUR (IV SOLUTION) ×1 IMPLANT

## 2019-05-15 NOTE — Anesthesia Postprocedure Evaluation (Signed)
Anesthesia Post Note  Patient: [name]  Procedure(s) Performed: LEFT ANKLE FUSION (Left Ankle) Application Of Wound Vac (Left Ankle)     Patient location during evaluation: PACU Anesthesia Type: General Level of consciousness: awake and alert Pain management: pain level controlled Vital Signs Assessment: post-procedure vital signs reviewed and stable Respiratory status: spontaneous breathing, nonlabored ventilation, respiratory function stable and patient connected to nasal cannula oxygen Cardiovascular status: blood pressure returned to baseline and stable Postop Assessment: no apparent nausea or vomiting Anesthetic complications: no    Last Vitals:  Vitals:   05/15/19 0837 05/15/19 0900  BP: 116/80   Pulse: 84   Resp:  (!) 22  Temp: 36.4 C   SpO2:      Last Pain:  Vitals:   05/15/19 0900  TempSrc:   PainSc: 0-No pain                 Dmiya Malphrus S

## 2019-05-15 NOTE — Transfer of Care (Signed)
Immediate Anesthesia Transfer of Care Note  Patient: [name]  Procedure(s) Performed: LEFT ANKLE FUSION (Left Ankle)  Patient Location: PACU  Anesthesia Type:General  Level of Consciousness: awake, alert  and oriented  Airway & Oxygen Therapy: Patient Spontanous Breathing and Patient connected to face mask oxygen  Post-op Assessment: Report given to RN and Post -op Vital signs reviewed and stable  Post vital signs: Reviewed and stable  Last Vitals:  Vitals Value Taken Time  BP 116/80 05/15/19 0833  Temp    Pulse 91 05/15/19 0833  Resp 25 05/15/19 0833  SpO2 97 % 05/15/19 0833  Vitals shown include unvalidated device data.  Last Pain:  Vitals:   05/15/19 0616  TempSrc: Oral  PainSc:          Complications: No apparent anesthesia complications

## 2019-05-15 NOTE — Discharge Summary (Signed)
Discharge Diagnoses:  Active Problems:   Post-traumatic arthritis of ankle, left   Status post ankle fusion   Surgeries: Procedure(s): LEFT ANKLE FUSION Application Of Wound Vac on 05/15/2019    Consultants:   Discharged Condition: Improved  Hospital Course: [name] is an 48 y.o. female who was admitted 05/15/2019 with a chief complaint of left ankle pain/deformity, with a final diagnosis of TRAUMATIC ARTHRITIS LEFT ANKLE.  Patient was brought to the operating room on 05/15/2019 and underwent Procedure(s): LEFT ANKLE FUSION Application Of Wound Vac.    Patient was given perioperative antibiotics:  Anti-infectives (From admission, onward)   Start     Dose/Rate Route Frequency Ordered Stop   05/15/19 0600  ceFAZolin (ANCEF) IVPB 2g/100 mL premix     2 g 200 mL/hr over 30 Minutes Intravenous On call to O.R. 05/15/19 [id number] 05/15/19 8469    .  Patient was given sequential compression devices, early ambulation, and aspirin for DVT prophylaxis.  Recent vital signs:  Patient Vitals for the past 24 hrs:  BP Temp Temp src Pulse Resp SpO2 Height Weight  05/15/19 0956 105/66 98.5 F (36.9 C) Oral 87 - 92 % - -  05/15/19 0900 - - - - (!) 22 - - -  05/15/19 0837 116/80 97.6 F (36.4 C) - 84 - - - -  05/15/19 0616 128/87 97.6 F (36.4 C) Oral (!) 101 - 93 % - -  05/15/19 0610 - - - - - - 5\' 3"  (1.6 m) 90.7 kg  .  Recent laboratory studies: No results found.  Discharge Medications:   Allergies as of 05/15/2019   No Known Allergies     Medication List    TAKE these medications   acetaminophen 325 MG tablet Commonly known as: TYLENOL Take 1-2 tablets (325-650 mg total) by mouth every 6 (six) hours as needed for mild pain or moderate pain. Start taking on: May 16, 2019   diphenhydramine-acetaminophen 25-500 MG Tabs tablet Commonly known as: TYLENOL PM Take 1 tablet by mouth at bedtime as needed (sleep).   escitalopram 10 MG tablet Commonly known as: LEXAPRO Take 10 mg  by mouth daily.   gabapentin 300 MG capsule Commonly known as: NEURONTIN Take 300 mg by mouth 3 (three) times daily.   loratadine 10 MG tablet Commonly known as: CLARITIN Take 10 mg by mouth daily.   omeprazole 20 MG capsule Commonly known as: PRILOSEC Take 20 mg by mouth daily.   ProAir HFA 108 (90 Base) MCG/ACT inhaler Generic drug: albuterol Take 1-2 puffs by mouth every 4 (four) hours as needed (shortness of breath/cough).   Suboxone 8-2 MG Film Generic drug: Buprenorphine HCl-Naloxone HCl Place 8 mg under the tongue 3 (three) times daily.   Symbicort 160-4.5 MCG/ACT inhaler Generic drug: budesonide-formoterol Take 2 puffs by mouth 2 (two) times daily.   tiotropium 18 MCG inhalation capsule Commonly known as: SPIRIVA Place 18 mcg into inhaler and inhale at bedtime.            Discharge Care Instructions  (From admission, onward)         Start     Ordered   05/15/19 0000  Non weight bearing    Comments: With fracture boot  Question Answer Comment  Laterality left   Extremity Lower      05/15/19 1028          Diagnostic Studies: Xr Ankle 2 Views Left  Result Date: 04/28/2019 Radiographs of the left ankle show severe post traumatic  arthritic changes, retained hardware over the tibia and osteopenia over the distal fibula. No acute fracture   Patient benefited maximally from their hospital stay and there were no complications.     Disposition: Discharge disposition: 01-Home or Self Care      Discharge Instructions    Call MD / Call 911   Complete by: As directed    If you experience chest pain or shortness of breath, CALL 911 and be transported to the hospital emergency room.  If you develope a fever above 101 F, pus (white drainage) or increased drainage or redness at the wound, or calf pain, call your surgeon's office.   Care order/instruction:   Complete by: As directed    Dc with Prevena VAC machine and instruct patient to keep plugged into  wall outlet to keep charged.   Constipation Prevention   Complete by: As directed    Drink plenty of fluids.  Prune juice may be helpful.  You may use a stool softener, such as Colace (over the counter) 100 mg twice a day.  Use MiraLax (over the counter) for constipation as needed.   Diet - low sodium heart healthy   Complete by: As directed    Increase activity slowly as tolerated   Complete by: As directed    Non weight bearing   Complete by: As directed    With fracture boot   Laterality: left   Extremity: Lower     Follow-up Information    Nadara Mustarduda, Marcus V, MD In 1 week.   Specialty: Orthopedic Surgery Contact information: [address] [place] [address] [phone]            Signed: Lazaro ArmsSHAWN Jeyren Danowski, Cordelia Poche-C 05/15/2019, 10:30 AM [facility] [phone]

## 2019-05-15 NOTE — Plan of Care (Signed)
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
## 2019-05-15 NOTE — Op Note (Signed)
05/15/2019  8:36 AM  PATIENT:  [name]    PRE-OPERATIVE DIAGNOSIS:  TRAUMATIC ARTHRITIS LEFT ANKLE  POST-OPERATIVE DIAGNOSIS:  Same  PROCEDURE:  LEFT ANKLE FUSION Removal of deep retained hardware. C arm fluoroscopy to verify alignment. Praveena wound VAC application   SURGEON:  Newt Minion, MD  PHYSICIAN ASSISTANT:None ANESTHESIA:   General  PREOPERATIVE INDICATIONS:  [name] is a  48 y.o. female with a diagnosis of TRAUMATIC ARTHRITIS LEFT ANKLE who failed conservative measures and elected for surgical management.    The risks benefits and alternatives were discussed with the patient preoperatively including but not limited to the risks of infection, bleeding, nerve injury, cardiopulmonary complications, the need for revision surgery, among others, and the patient was willing to proceed.  OPERATIVE IMPLANTS: Arthrex anterior lateral fusion plate with Praveena wound VAC  @ENCIMAGES @  OPERATIVE FINDINGS: C-arm fluoroscopy verified stable alignment after fusion.  OPERATIVE PROCEDURE: Patient was brought the operating room after undergoing a popliteal block she then underwent a general anesthetic.  After adequate levels anesthesia were obtained patient's left lower extremity was prepped using DuraPrep draped into a sterile field a timeout was called.  Incision was made anteriorly over the anterior tibial tendon.  The anterior tibial tendon was retracted medially.  Dissection was carried down to the tibial talar joint subperiosteal dissection was utilized to identify the tibial talar joint.  There was prominent hardware in the medial malleolus and this was removed with a small frag screwdriver.  Using an oscillating saw osteotome and curette and rondure the articular surface and gutters were debrided of articular cartilage.  The ankle was reduced at 90 degrees.  The anterior lateral plate was applied this was secured distally with 2 compression screws.  The dynamic screw was  then placed and the ankle at 90 degrees was dynamically compressed with the compression screw.  A oblique screw was then placed to further stabilize the tibial talar joint and another screw was placed proximally.  C-arm fluoroscopy verified alignment of the tibial talar joint.  Clinically the ankle was at 90 degrees.  The wound was irrigated with normal saline the demineralized bone matrix putty was placed at the fusion site.  2-0 nylon was used for wound closure the 13 cm Praveena wound VAC was applied this had a good suction fit patient was extubated taken the PACU in stable condition.   DISCHARGE PLANNING:  Antibiotic duration: 24-hour antibiotics  Weightbearing: Nonweightbearing on the left with a fracture boot  Pain medication: Prescription for Percocet called into [facility]  Dressing care/ Wound VAC: Continue wound VAC for 1 week  Ambulatory devices: Crutches or walker  Discharge to: Discharge to home in the morning.  Follow-up: In the office 1 week post operative.

## 2019-05-15 NOTE — Anesthesia Procedure Notes (Addendum)
Procedure Name: Intubation Date/Time: 05/15/2019 7:30 AM Performed by: Jearld Pies, CRNA Pre-anesthesia Checklist: Patient identified, Emergency Drugs available, Suction available and Patient being monitored Patient Re-evaluated:Patient Re-evaluated prior to induction Oxygen Delivery Method: Circle System Utilized Preoxygenation: Pre-oxygenation with 100% oxygen Induction Type: IV induction, Rapid sequence and Cricoid Pressure applied Laryngoscope Size: Mac and 3 Grade View: Grade I Tube type: Oral Number of attempts: 1 Airway Equipment and Method: Stylet and Oral airway Placement Confirmation: ETT inserted through vocal cords under direct vision,  positive ETCO2 and breath sounds checked- equal and bilateral Secured at: 21 cm Tube secured with: Tape Dental Injury: Teeth and Oropharynx as per pre-operative assessment  Comments: RSI d/t uncontrollable GERD and "sip of sweet tea" with medications per patient reports, noted bilious gastric contents in back of oropharynx, vocal cords clear of particulate, suctioned oropharynx, visualized ETT pass without gastric contamination through vocal cords. Gastric decompression post intubation with OG tube.

## 2019-05-15 NOTE — Discharge Instructions (Signed)
Keep Prevena VAC machine plugged into wall outlet as much as possible to keep charged.   Non weight bearing over the left foot with fracture boot.

## 2019-05-15 NOTE — Anesthesia Procedure Notes (Signed)
Anesthesia Procedure Image    

## 2019-05-15 NOTE — Evaluation (Signed)
Occupational Therapy Evaluation Patient Details Name: [name] L Wandel MRN: [id number] DOB: 12/02/1970 Today's Date: 05/15/2019    History of Present Illness LEFT ANKLE FUSION with wound vac. PHMx: anxiety, COPD, chronic pain, bipolar, OA.   Clinical Impression   This 48 yo female admitted and underwent above presents to acute OT with decreased balance, decreased mobility, increased pain, and NWB'ing LLE all affecting her safety and independence with basic ADLs. She will benefit from acute OT with follow up HHOT and 24 hour S/prn A.    Follow Up Recommendations  Home health OT;Supervision/Assistance - 24 hour    Equipment Recommendations  Tub/shower bench       Precautions / Restrictions Precautions Precautions: Fall Restrictions Weight Bearing Restrictions: Yes LLE Weight Bearing: Non weight bearing      Mobility Bed Mobility               General bed mobility comments: Pt up in recliner upon arrival  Transfers Overall transfer level: Needs assistance Equipment used: Rolling walker (2 wheeled) Transfers: Sit to/from Stand Sit to Stand: Mod assist         General transfer comment: increased time and cues for safe hand placement    Balance Overall balance assessment: Needs assistance Sitting-balance support: No upper extremity supported;Feet supported Sitting balance-Leahy Scale: Good     Standing balance support: Bilateral upper extremity supported Standing balance-Leahy Scale: Poor                             ADL either performed or assessed with clinical judgement   ADL Overall ADL's : Needs assistance/impaired Eating/Feeding: Independent;Sitting   Grooming: Set up;Sitting   Upper Body Bathing: Set up;Sitting   Lower Body Bathing: Moderate assistance Lower Body Bathing Details (indicate cue type and reason): Mod A sit<>stand Upper Body Dressing : Set up;Sitting   Lower Body Dressing: Maximal assistance Lower Body Dressing Details  (indicate cue type and reason): Mod A sit<>stand Toilet Transfer: Moderate assistance;Stand-pivot   Toileting- Clothing Manipulation and Hygiene: Moderate assistance Toileting - Clothing Manipulation Details (indicate cue type and reason): Mod A sit<>stand             Vision Patient Visual Report: No change from baseline              Pertinent Vitals/Pain Pain Assessment: Faces Faces Pain Scale: Hurts little more Pain Location: LLE Pain Descriptors / Indicators: Aching;Sore Pain Intervention(s): Limited activity within patient's tolerance;Monitored during session     Hand Dominance Right   Extremity/Trunk Assessment Upper Extremity Assessment Upper Extremity Assessment: Overall WFL for tasks assessed           Communication Communication Communication: No difficulties   Cognition Arousal/Alertness: Awake/alert Behavior During Therapy: WFL for tasks assessed/performed Overall Cognitive Status: Within Functional Limits for tasks assessed                                                Home Living Family/patient expects to be discharged to:: Private residence Living Arrangements: Spouse/significant other;Non-relatives/Friends Available Help at Discharge: Friend(s);Family;Available 24 hours/day Type of Home: House Home Access: Ramped entrance     Home Layout: One level     Bathroom Shower/Tub: Chief Strategy OfficerTub/shower unit   Bathroom Toilet: Standard     Home Equipment: Shower seat;Wheelchair - manual  Prior Functioning/Environment Level of Independence: Independent                 OT Problem List: Decreased strength;Decreased range of motion;Impaired balance (sitting and/or standing);Pain      OT Treatment/Interventions: Self-care/ADL training;DME and/or AE instruction;Balance training;Patient/family education    OT Goals(Current goals can be found in the care plan section) Acute Rehab OT Goals Patient Stated Goal: to go  home OT Goal Formulation: With patient Time For Goal Achievement: 05/22/19 Potential to Achieve Goals: Good  OT Frequency: Min 2X/week              AM-PAC OT "6 Clicks" Daily Activity     Outcome Measure Help from another person eating meals?: None Help from another person taking care of personal grooming?: A Little Help from another person toileting, which includes using toliet, bedpan, or urinal?: A Lot Help from another person bathing (including washing, rinsing, drying)?: A Lot Help from another person to put on and taking off regular upper body clothing?: A Little Help from another person to put on and taking off regular lower body clothing?: A Lot 6 Click Score: 16   End of Session Equipment Utilized During Treatment: Gait belt;Rolling walker  Activity Tolerance: Patient tolerated treatment well Patient left: in chair;with call bell/phone within reach  OT Visit Diagnosis: Unsteadiness on feet (R26.81);Other abnormalities of gait and mobility (R26.89);Muscle weakness (generalized) (M62.81);Pain Pain - Right/Left: Left Pain - part of body: Ankle and joints of foot                Time: 5638-9373 OT Time Calculation (min): 19 min Charges:  OT General Charges $OT Visit: 1 Visit OT Evaluation $OT Eval Moderate Complexity: 1 Mod  Golden Circle, OTR/L Acute [employer] Pager [phone] Office [phone]     Almon Register 05/15/2019, 4:51 PM

## 2019-05-15 NOTE — Anesthesia Preprocedure Evaluation (Signed)
Anesthesia Evaluation  Patient identified by MRN, date of birth, ID band Patient awake    Reviewed: Allergy & Precautions, NPO status , Patient's Chart, lab work & pertinent test results  Airway Mallampati: II  TM Distance: >3 FB Neck ROM: Full    Dental no notable dental hx.    Pulmonary COPD, Current Smoker,    Pulmonary exam normal breath sounds clear to auscultation       Cardiovascular negative cardio ROS Normal cardiovascular exam Rhythm:Regular Rate:Normal     Neuro/Psych Bipolar Disorder negative neurological ROS  negative psych ROS   GI/Hepatic negative GI ROS, (+)     substance abuse  ,   Endo/Other  negative endocrine ROS  Renal/GU negative Renal ROS  negative genitourinary   Musculoskeletal negative musculoskeletal ROS (+)   Abdominal   Peds negative pediatric ROS (+)  Hematology negative hematology ROS (+)   Anesthesia Other Findings   Reproductive/Obstetrics negative OB ROS                             Anesthesia Physical Anesthesia Plan  ASA: III  Anesthesia Plan: General   Post-op Pain Management:  Regional for Post-op pain   Induction: Intravenous  PONV Risk Score and Plan: 2 and Ondansetron, Dexamethasone and Treatment may vary due to age or medical condition  Airway Management Planned: LMA and Oral ETT  Additional Equipment:   Intra-op Plan:   Post-operative Plan: Extubation in OR  Informed Consent: I have reviewed the patients History and Physical, chart, labs and discussed the procedure including the risks, benefits and alternatives for the proposed anesthesia with the patient or authorized representative who has indicated his/her understanding and acceptance.     Dental advisory given  Plan Discussed with: CRNA and Surgeon  Anesthesia Plan Comments:         Anesthesia Quick Evaluation

## 2019-05-15 NOTE — Progress Notes (Signed)
Orthopedic Tech Progress Note Patient Details:  [name] February 04, 1971 [id number]  Ortho Devices Type of Ortho Device: CAM walker Ortho Device/Splint Location: LLE Ortho Device/Splint Interventions: Adjustment, Ordered, Application   Post Interventions Patient Tolerated: Well Instructions Provided: Adjustment of device, Care of device   Janit Pagan 05/15/2019, 10:41 AM

## 2019-05-15 NOTE — Progress Notes (Signed)
05/15/19 1759  PT Visit Information  Last PT Received On 05/15/19  Assistance Needed +1  History of Present Illness Pt is a 48 y/o female s/p L ankle fusion. PMH includes COPD, bipolar, and anxiety.   Precautions  Precautions Fall;Other (comment)  Precaution Comments Wound vac  Required Braces or Orthoses Other Brace  Other Brace L cam walker boot  Restrictions  Weight Bearing Restrictions Yes  LLE Weight Bearing NWB  Home Living  Family/patient expects to be discharged to: Private residence  Living Arrangements Spouse/significant other;Non-relatives/Friends  Available Help at Discharge Friend(s);Family;Available 24 hours/day  Type of Home House  Home Access Ramped entrance  Home Layout One level  Bathroom Shower/Tub Tub/shower unit  CopyBathroom Toilet Standard  Home Equipment Shower seat;Wheelchair - manual  Prior Function  Level of Independence Independent  Communication  Communication No difficulties  Pain Assessment  Pain Assessment 0-10  Pain Score 10  Pain Location L ankle   Pain Descriptors / Indicators Aching;Sore;Grimacing;Guarding  Pain Intervention(s) Limited activity within patient's tolerance;Monitored during session;Repositioned  Cognition  Arousal/Alertness Awake/alert  Behavior During Therapy WFL for tasks assessed/performed  Overall Cognitive Status Within Functional Limits for tasks assessed  Upper Extremity Assessment  Upper Extremity Assessment Defer to OT evaluation  Lower Extremity Assessment  Lower Extremity Assessment LLE deficits/detail  LLE Deficits / Details Deficits consistent with post op pain and weakness.   Cervical / Trunk Assessment  Cervical / Trunk Assessment Normal  Bed Mobility  Overal bed mobility Needs Assistance  Bed Mobility Supine to Sit  Supine to sit Supervision  General bed mobility comments Supervision for safety. Increased time required to come to sitting.   Transfers  Overall transfer level Needs assistance  Equipment  used Rolling walker (2 wheeled)  Transfers Sit to/from UGI CorporationStand;Stand Pivot Transfers  Sit to Stand Mod assist  Stand pivot transfers Min assist  General transfer comment Mod A for lift assist and steadying assist to stand. Cues to kick LLE out to maintain NWB. Min A for steadying assist to transfer to chair. Cues for sequencing using RW.   Balance  Overall balance assessment Needs assistance  Sitting-balance support No upper extremity supported;Feet supported  Sitting balance-Leahy Scale Good  Standing balance support Bilateral upper extremity supported  Standing balance-Leahy Scale Poor  Standing balance comment Reliant on BUE support   General Comments  General comments (skin integrity, edema, etc.) Educated about using WC for mobility to increase safety.   PT - End of Session  Equipment Utilized During Treatment Gait belt;Other (comment) (CAM walker )  Activity Tolerance Patient tolerated treatment well  Patient left in chair;with call bell/phone within reach  Nurse Communication Mobility status  PT Assessment  PT Recommendation/Assessment Patient needs continued PT services  PT Visit Diagnosis Unsteadiness on feet (R26.81);Muscle weakness (generalized) (M62.81);Difficulty in walking, not elsewhere classified (R26.2);Pain  Pain - Right/Left Left  Pain - part of body Ankle and joints of foot  PT Problem List Decreased strength;Decreased balance;Decreased mobility;Decreased knowledge of use of DME;Pain;Decreased knowledge of precautions  PT Plan  PT Frequency (ACUTE ONLY) Min 5X/week  PT Treatment/Interventions (ACUTE ONLY) DME instruction;Gait training;Functional mobility training;Therapeutic activities;Therapeutic exercise;Balance training;Patient/family education;Wheelchair mobility training  AM-PAC PT "6 Clicks" Mobility Outcome Measure (Version 2)  Help needed turning from your back to your side while in a flat bed without using bedrails? 3  Help needed moving from lying on your  back to sitting on the side of a flat bed without using bedrails? 3  Help needed moving  to and from a bed to a chair (including a wheelchair)? 3  Help needed standing up from a chair using your arms (e.g., wheelchair or bedside chair)? 2  Help needed to walk in hospital room? 2  Help needed climbing 3-5 steps with a railing?  2  6 Click Score 15  Consider Recommendation of Discharge To: CIR/SNF/LTACH  PT Recommendation  Follow Up Recommendations Home health PT;Supervision/Assistance - 24 hour  PT equipment Rolling walker with 5" wheels  Individuals Consulted  Consulted and Agree with Results and Recommendations Patient  Acute Rehab PT Goals  Patient Stated Goal to go home  PT Goal Formulation With patient  Time For Goal Achievement 05/29/19  Potential to Achieve Goals Good  PT Time Calculation  PT Start Time (ACUTE ONLY) 1455  PT Stop Time (ACUTE ONLY) 1513  PT Time Calculation (min) (ACUTE ONLY) 18 min  PT General Charges  $$ ACUTE PT VISIT 1 Visit  PT Evaluation  $PT Eval Low Complexity 1 Low  Written Expression  Dominant Hand Right   Pt s/p surgery above with deficits above. Pt requiring mod A to stand and min A to transfer with use of RW this session. Cues to maintain NWB in LLE. Educated about using WC at home to increase safety with mobility and ensure she is able to maintain NWB in LLE. Pt reports she has WC at home and that her boyfriend can assist with mobility tasks. Will require RW to safely complete transfer. Feel pt will require HHPT and 24/7 assist initially. Will continue to follow acutely to maximize functional mobility independence and safety.   Leighton Ruff, PT, DPT  Acute Rehabilitation Services  Pager: [phone] Office: [phone]

## 2019-05-15 NOTE — Anesthesia Procedure Notes (Addendum)
Anesthesia Regional Block: Popliteal block   Pre-Anesthetic Checklist: ,, timeout performed, Correct Patient, Correct Site, Correct Laterality, Correct Procedure, Correct Position, site marked, Risks and benefits discussed,  Surgical consent,  Pre-op evaluation,  At surgeon's request and post-op pain management  Laterality: Left  Prep: chloraprep       Needles:  Injection technique: Single-shot  Needle Type: Echogenic Needle     Needle Length: 9cm      Additional Needles:   Procedures:,,,, ultrasound used (permanent image in chart),,,,  Narrative:  Start time: 05/15/2019 7:00 AM End time: 05/15/2019 7:08 AM Injection made incrementally with aspirations every 5 mL.  Performed by: Personally  Anesthesiologist: Myrtie Soman, MD  Additional Notes: Patient tolerated the procedure well without complications  Saphenous block with standard 25G needle as well . No picture

## 2019-05-15 NOTE — Progress Notes (Signed)
7357 Received pt from PACU, A&O x4. Left ankle incision with Prevena wound vac dry and intact. Denies pain, left foot is numb, cannot move toes yet but can lift up left leg.

## 2019-05-16 DIAGNOSIS — M12572 Traumatic arthropathy, left ankle and foot: Secondary | ICD-10-CM | POA: Diagnosis not present

## 2019-05-16 NOTE — Progress Notes (Addendum)
Physical Therapy Treatment Patient Details Name: [name] MRN: [id number] DOB: 04-26-1971 Today's Date: 05/16/2019    History of Present Illness Pt is a 48 y/o female s/p L ankle fusion. PMH includes COPD, bipolar, and anxiety.     PT Comments    Pt performed transfer via squat pivot from recliner <> WC with min guard assistance.  She continues to desat with activity to 86%-95%.  Informed nursing. Pt presents with DOE.  Plan for return home remains appropriate.  Pt will require WC at d/c as the [facility] she reported she had was her roommates and is too small for her.   Will inform supervising PT of need for change in recommendations at this time.  Follow Up Recommendations  Home health PT;Supervision/Assistance - 24 hour     Equipment Recommendations  Rolling walker with 5" wheels;Wheelchair cushion (measurements PT);Wheelchair (measurements PT);Other (comment)(will need an 18x18 WC.)    Recommendations for Other Services       Precautions / Restrictions Precautions Precautions: Fall;Other (comment) Required Braces or Orthoses: Other Brace Other Brace: L cam walker boot Restrictions Weight Bearing Restrictions: Yes LLE Weight Bearing: Non weight bearing    Mobility  Bed Mobility               General bed mobility comments: Pt seated in recliner on arrival.  Transfers Overall transfer level: Needs assistance Equipment used: None Transfers: Squat Pivot Transfers Sit to Stand: Min guard         General transfer comment: Pt performed squat pivot x 2 from recliner to WC and WC back to recliner.  Transferred to her strong side both attempts.  Pt required cues for hand placement and to pivot RLE to improve ease.  Ambulation/Gait Ambulation/Gait assistance: (NT)               Theme park manager mobility: Yes Wheelchair propulsion: Both upper extremities Wheelchair parts: Needs assistance Distance:  150 Wheelchair Assistance Details (indicate cue type and reason): Pt required review of brake application, removing and placing leg rests, how to operate leg rest, removing and replacing arm rests.  Pt slow and guarded.  She is limited by DOE.  Cues for turning to avoid contact with corners of the walls.  Modified Rankin (Stroke Patients Only)       Balance Overall balance assessment: Needs assistance Sitting-balance support: No upper extremity supported;Feet supported Sitting balance-Leahy Scale: Good     Standing balance support: Bilateral upper extremity supported Standing balance-Leahy Scale: Poor                              Cognition Arousal/Alertness: Awake/alert Behavior During Therapy: WFL for tasks assessed/performed Overall Cognitive Status: Within Functional Limits for tasks assessed                                        Exercises      General Comments        Pertinent Vitals/Pain Pain Assessment: 0-10 Pain Score: 4  Pain Location: L ankle  Pain Descriptors / Indicators: Aching;Sore;Grimacing;Guarding Pain Intervention(s): Monitored during session;Repositioned    Home Living                      Prior Function  PT Goals (current goals can now be found in the care plan section) Acute Rehab PT Goals Patient Stated Goal: to go home Potential to Achieve Goals: Fair Additional Goals Additional Goal #1: Pt will perform >125' WC mobility, mod I, to ensure safety with mobility tasks at home.    Frequency    Min 5X/week      PT Plan Discharge plan needs to be updated    Co-evaluation              AM-PAC PT "6 Clicks" Mobility   Outcome Measure  Help needed turning from your back to your side while in a flat bed without using bedrails?: A Little Help needed moving from lying on your back to sitting on the side of a flat bed without using bedrails?: A Little Help needed moving to and from a bed  to a chair (including a wheelchair)?: A Little Help needed standing up from a chair using your arms (e.g., wheelchair or bedside chair)?: A Lot Help needed to walk in hospital room?: A Lot Help needed climbing 3-5 steps with a railing? : A Lot 6 Click Score: 15    End of Session   Activity Tolerance: Patient tolerated treatment well Patient left: in chair;with call bell/phone within reach Nurse Communication: Mobility status(dropping of O2 sats and need for DME) PT Visit Diagnosis: Unsteadiness on feet (R26.81);Muscle weakness (generalized) (M62.81);Difficulty in walking, not elsewhere classified (R26.2);Pain Pain - Right/Left: Left Pain - part of body: Ankle and joints of foot     Time: 0943-1006 PT Time Calculation (min) (ACUTE ONLY): 23 min  Charges:  $Therapeutic Activity: 8-22 mins $Wheel Chair Management: 8-22 mins                     Joycelyn RuaAimee Adonte Vanriper, PTA Acute Rehabilitation Services Pager [phone] Office [phone]     Aquan Kope Artis DelayJ Kaslyn [place] 05/16/2019, 10:21 AM

## 2019-05-16 NOTE — Progress Notes (Signed)
As pt was waiting on equipment pt stated she felt sick on the stomach and vomited twice. O2 levels between 77%-84% on RA. Pt states she thinks it is the Subutex that is making her sick states she takes something different at home. Pt is lethargic. Pt is now on 3L of O2 and is resting and states she does not feel nauseous anymore. Dr. Ninfa Linden aware of this episode and agreed for pt to stay one more night.

## 2019-05-16 NOTE — TOC Transition Note (Signed)
Transition of Care [facility]) - CM/SW Discharge Note   Patient Details  Name: [name] MRN: [id number] Date of Birth: 1970-11-28  Transition of Care [facility]) CM/SW Contact:  Claudie Leach, RN Phone Number: 05/16/2019, 12:41 PM   Clinical Narrative:    Pt to d/c home.  Patient states the [facility] she had been using is someone else's and not suitable.  HH and DME set up .   Final next level of care: [place] Barriers to Discharge: No Barriers Identified   Patient Goals and CMS Choice Patient states their goals for this hospitalization and ongoing recovery are:: to go home CMS Medicare.gov Compare Post Acute Care list provided to:: Patient Choice offered to / list presented to : Patient  Discharge Plan and Services                DME Arranged: Gilford Rile rolling, Wheelchair manual DME Agency: [employer] Date DME Agency Contacted: 05/16/19 Time DME Agency Contacted: 1221 Representative spoke with at DME Agency: [place]: PT [place]: [place] ([place]) Date [place]: 05/16/19 Time [place]: 1215 Representative spoke with at [place]: Floydene Flock

## 2019-05-16 NOTE — Progress Notes (Deleted)
    Durable Medical Equipment  (From admission, onward)         Start     Ordered   05/16/19 1129  For home use only DME Walker rolling  Once    Question:  Patient needs a walker to treat with the following condition  Answer:  Weakness of right leg   05/16/19 1128   05/16/19 1107  For home use only DME lightweight manual wheelchair with seat cushion  Once    Comments: Patient suffers from left ankle surgery which impairs their ability to perform daily activities like feeding, dressing, toileting in the home.  A walker will not resolve  issue with performing activities of daily living. A wheelchair will allow patient to safely perform daily activities. Patient is not able to propel themselves in the home using a standard weight wheelchair due to weakness. Patient can self propel in the lightweight wheelchair. Length of need: lifetime. Accessories: elevating leg rests (ELRs), wheel locks, back cushion, extensions and anti-tippers.   05/16/19 [place], PT, DPT Acute Rehabilitation Services  Pager [phone] Office [phone]

## 2019-05-16 NOTE — Progress Notes (Signed)
Occupational Therapy Treatment and Discharge Patient Details Name: [name] MRN: [id number] DOB: 24-May-1971 Today's Date: 05/16/2019    History of present illness Pt is a 48 y/o female s/p L ankle fusion. PMH includes COPD, bipolar, and anxiety.    OT comments  This 48 yo female s/p above presents to acute OT today with education completed on tub transfers with tub bench and LBD. Pt's sats would drop into mid 80's on RA at times, she would cough and they would increase to low 90's then drop back down--while pt at rest in recliner. I changed out pulse ox probe and made RN aware.  Follow Up Recommendations  Home health OT;Supervision/Assistance - 24 hour    Equipment Recommendations  Tub/shower bench       Precautions / Restrictions Precautions Precautions: Fall Precaution Comments: Wound vac Required Braces or Orthoses: Other Brace Other Brace: L cam walker boot Restrictions Weight Bearing Restrictions: Yes LLE Weight Bearing: Non weight bearing       Mobility Bed Mobility Overal bed mobility: Modified Independent Bed Mobility: Supine to Sit           General bed mobility comments: Pt seated in recliner on arrival.  Transfers Overall transfer level: Needs assistance Equipment used: Rolling walker (2 wheeled) Transfers: Squat Pivot Transfers Sit to Stand: Min guard         General transfer comment: Pt performed squat pivot x 2 from recliner to WC and WC back to recliner.  Transferred to her strong side both attempts.  Pt required cues for hand placement and to pivot RLE to improve ease.    Balance Overall balance assessment: Needs assistance;No apparent balance deficits (not formally assessed) Sitting-balance support: No upper extremity supported;Feet supported Sitting balance-Leahy Scale: Good     Standing balance support: Single extremity supported Standing balance-Leahy Scale: Poor Standing balance comment: pulling up underwear                            ADL either performed or assessed with clinical judgement   ADL Overall ADL's : Needs assistance/impaired                     Lower Body Dressing: Set up;Supervision/safety Lower Body Dressing Details (indicate cue type and reason): min guard A sit<>stand Toilet Transfer: Minimal assistance;Ambulation;RW Toilet Transfer Details (indicate cue type and reason): hopping 3 feet Toileting- Clothing Manipulation and Hygiene: Minimal assistance Toileting - Clothing Manipulation Details (indicate cue type and reason): min guard A sit<>stand Tub/ Shower Transfer: Min guard;Ambulation;Rolling walker;Tub bench;Tub transfer     General ADL Comments: We discussed getting clearance about showering and clearing/coordinationg it with wound vac changes. If she need to keep wound vac dressing dry to use [employer] press n seal all the way around leg where wound vac is. Get into tub (on shower bench) with Camboot on, take came boot off once in shower, put camboot back on before getting out of tub. Pt reported she felt fine about knowing how to donn and doff her camboot and did not need to practice     Vision Patient Visual Report: No change from baseline            Cognition Arousal/Alertness: Awake/alert Behavior During Therapy: WFL for tasks assessed/performed Overall Cognitive Status: Within Functional Limits for tasks assessed  Pertinent Vitals/ Pain       Pain Assessment: 0-10 Pain Score: 5  Pain Location: L ankle  Pain Descriptors / Indicators: Aching;Sore;Guarding Pain Intervention(s): Limited activity within patient's tolerance;Monitored during session;Repositioned         Frequency  Min 2X/week        Progress Toward Goals  OT Goals(current goals can now be found in the care plan section)  Progress towards OT goals: Progressing toward goals  Acute Rehab OT Goals Patient Stated Goal:  to go home  Plan Discharge plan remains appropriate       AM-PAC OT "6 Clicks" Daily Activity     Outcome Measure   Help from another person eating meals?: None Help from another person taking care of personal grooming?: A Little Help from another person toileting, which includes using toliet, bedpan, or urinal?: A Little Help from another person bathing (including washing, rinsing, drying)?: A Little Help from another person to put on and taking off regular upper body clothing?: A Little Help from another person to put on and taking off regular lower body clothing?: A Little 6 Click Score: 19    End of Session Equipment Utilized During Treatment: Gait belt;Rolling walker(camboot)  OT Visit Diagnosis: Unsteadiness on feet (R26.81);Other abnormalities of gait and mobility (R26.89);Muscle weakness (generalized) (M62.81);Pain Pain - Right/Left: Left Pain - part of body: Ankle and joints of foot   Activity Tolerance Patient tolerated treatment well   Patient Left in chair;with call bell/phone within reach   Nurse Communication (pt's sats dropping into mid 80's on RA--RN said she would be in to check.)        Time: [id number] OT Time Calculation (min): 64 min  Charges: OT General Charges $OT Visit: 1 Visit OT Treatments $Self Care/Home Management : 53-67 mins Ignacia Palmaathy Bayan Kushnir, OTR/L Acute Rehab Services Pager [phone] Office [phone]      Evette GeorgesLeonard, Emmylou Bieker Eva 05/16/2019, 11:44 AM

## 2019-05-16 NOTE — Progress Notes (Signed)
    Durable Medical Equipment  (From admission, onward)         Start     Ordered   05/16/19 1129  For home use only DME Walker rolling  Once    Question:  Patient needs a walker to treat with the following condition  Answer:  Weakness of right leg   05/16/19 1128   05/16/19 1107  For home use only DME lightweight manual wheelchair with seat cushion  Once    Comments: Patient suffers from left ankle surgery which impairs their ability to perform daily activities like feeding, dressing, toileting in the home.  A walker will not resolve  issue with performing activities of daily living. A wheelchair will allow patient to safely perform daily activities. Patient is not able to propel themselves in the home using a standard weight wheelchair due to weakness. Patient can self propel in the lightweight wheelchair. Length of need: lifetime. Accessories: elevating leg rests (ELRs), wheel locks, back cushion, extensions and anti-tippers.   05/16/19 [place], PTA Acute Rehabilitation Services Pager [phone] Office [phone]

## 2019-05-16 NOTE — Plan of Care (Signed)
  Problem: Safety: Goal: Ability to remain free from injury will improve Outcome: Progressing   

## 2019-05-16 NOTE — Discharge Summary (Signed)
Discharge Diagnoses:  Active Problems:   Post-traumatic arthritis of ankle, left   Status post ankle fusion   Surgeries: Procedure(s): LEFT ANKLE FUSION Application Of Wound Vac on 05/15/2019    Consultants:   Discharged Condition: Improved  Hospital Course: [name] is an 48 y.o. female who was admitted 05/15/2019 with a chief complaint of traumatic arthritis left ankle, with a final diagnosis of TRAUMATIC ARTHRITIS LEFT ANKLE.  Patient was brought to the operating room on 05/15/2019 and underwent Procedure(s): LEFT ANKLE FUSION Application Of Wound Vac.    Patient was given perioperative antibiotics:  Anti-infectives (From admission, onward)   Start     Dose/Rate Route Frequency Ordered Stop   05/15/19 0600  ceFAZolin (ANCEF) IVPB 2g/100 mL premix     2 g 200 mL/hr over 30 Minutes Intravenous On call to O.R. 05/15/19 7341 05/15/19 9379    .  Patient was given sequential compression devices, early ambulation, and aspirin for DVT prophylaxis.  Recent vital signs:  Patient Vitals for the past 24 hrs:  BP Temp Temp src Pulse Resp SpO2  05/16/19 0546 (!) 129/92 - - (!) 104 - -  05/16/19 0505 (!) 142/106 98.1 F (36.7 C) Oral (!) 113 18 96 %  05/15/19 2334 (!) 140/111 98.4 F (36.9 C) Oral (!) 117 17 90 %  05/15/19 2044 - - - - - 92 %  05/15/19 1946 124/86 98.2 F (36.8 C) Oral (!) 110 14 (!) 86 %  05/15/19 1434 117/73 97.6 F (36.4 C) Oral 94 16 92 %  05/15/19 0956 105/66 98.5 F (36.9 C) Oral 87 - 92 %  05/15/19 0900 - - - - (!) 22 -  .  Recent laboratory studies: No results found.  Discharge Medications:   Allergies as of 05/16/2019   No Known Allergies     Medication List    TAKE these medications   acetaminophen 325 MG tablet Commonly known as: TYLENOL Take 1-2 tablets (325-650 mg total) by mouth every 6 (six) hours as needed for mild pain or moderate pain.   diphenhydramine-acetaminophen 25-500 MG Tabs tablet Commonly known as: TYLENOL PM Take 1  tablet by mouth at bedtime as needed (sleep).   escitalopram 10 MG tablet Commonly known as: LEXAPRO Take 10 mg by mouth daily.   gabapentin 300 MG capsule Commonly known as: NEURONTIN Take 300 mg by mouth 3 (three) times daily.   loratadine 10 MG tablet Commonly known as: CLARITIN Take 10 mg by mouth daily.   omeprazole 20 MG capsule Commonly known as: PRILOSEC Take 20 mg by mouth daily.   ProAir HFA 108 (90 Base) MCG/ACT inhaler Generic drug: albuterol Take 1-2 puffs by mouth every 4 (four) hours as needed (shortness of breath/cough).   Suboxone 8-2 MG Film Generic drug: Buprenorphine HCl-Naloxone HCl Place 8 mg under the tongue 3 (three) times daily.   Symbicort 160-4.5 MCG/ACT inhaler Generic drug: budesonide-formoterol Take 2 puffs by mouth 2 (two) times daily.   tiotropium 18 MCG inhalation capsule Commonly known as: SPIRIVA Place 18 mcg into inhaler and inhale at bedtime.            Discharge Care Instructions  (From admission, onward)         Start     Ordered   05/15/19 0000  Non weight bearing    Comments: With fracture boot  Question Answer Comment  Laterality left   Extremity Lower      05/15/19 1028  Diagnostic Studies: Xr Ankle 2 Views Left  Result Date: 04/28/2019 Radiographs of the left ankle show severe post traumatic arthritic changes, retained hardware over the tibia and osteopenia over the distal fibula. No acute fracture   Patient benefited maximally from their hospital stay and there were no complications.     Disposition: Discharge disposition: 01-Home or Self Care      Discharge Instructions    Ambulatory referral to Home Health   Complete by: As directed    Please evaluate [name] for admission to [facility].  Disciplines requested: Physical Therapy  Services to provide: Strengthening Exercises  Physician to follow patient's care (the person listed here will be responsible for signing ongoing  orders): Referring Provider  Requested Start of Care Date: Within 2-3 days  I certify that this patient is under my care and that I, or a Nurse Practitioner or Physician's Assistant working with me, had a face-to-face encounter that meets the physician face-to-face requirements with patient on 05/16/19. The encounter with the patient was in whole, or in part for the following medical condition(s) which is the primary reason for home health care (List medical condition). Ankle fusion  Special Instructions:  PT   Does the patient have Medicare or Medicaid?: No   The encounter with the patient was in whole, or in part, for the following medical condition, which is the primary reason for home health care: ankle fusion   Reason for Medically Necessary Home Health Services: Therapy- Investment banker, operationalGait Training, Patent examinerTransfer Training and Stair Training   My clinical findings support the need for the above services: Unable to leave home safely without assistance and/or assistive device   I certify that, based on my findings, the following services are medically necessary home health services: Physical therapy   Further, I certify that my clinical findings support that this patient is homebound due to: Ambulates short distances less than 300 feet   Call MD / Call 911   Complete by: As directed    If you experience chest pain or shortness of breath, CALL 911 and be transported to the hospital emergency room.  If you develope a fever above 101 F, pus (white drainage) or increased drainage or redness at the wound, or calf pain, call your surgeon's office.   Call MD / Call 911   Complete by: As directed    If you experience chest pain or shortness of breath, CALL 911 and be transported to the hospital emergency room.  If you develope a fever above 101 F, pus (white drainage) or increased drainage or redness at the wound, or calf pain, call your surgeon's office.   Care order/instruction:   Complete by: As directed    Dc with  Prevena VAC machine and instruct patient to keep plugged into wall outlet to keep charged.   Constipation Prevention   Complete by: As directed    Drink plenty of fluids.  Prune juice may be helpful.  You may use a stool softener, such as Colace (over the counter) 100 mg twice a day.  Use MiraLax (over the counter) for constipation as needed.   Constipation Prevention   Complete by: As directed    Drink plenty of fluids.  Prune juice may be helpful.  You may use a stool softener, such as Colace (over the counter) 100 mg twice a day.  Use MiraLax (over the counter) for constipation as needed.   Diet - low sodium heart healthy   Complete by: As directed  Diet - low sodium heart healthy   Complete by: As directed    Increase activity slowly as tolerated   Complete by: As directed    Increase activity slowly as tolerated   Complete by: As directed    Non weight bearing   Complete by: As directed    With fracture boot   Laterality: left   Extremity: Lower     Follow-up Information    Nadara Mustarduda, Dezerae Freiberger V, MD In 1 week.   Specialty: Orthopedic Surgery Contact information: [address] [place] [address] [phone]            Signed: Nadara MustardMarcus V Lauralie Blacksher 05/16/2019, 8:37 AM

## 2019-05-16 NOTE — Plan of Care (Signed)

## 2019-05-17 ENCOUNTER — Other Ambulatory Visit: Payer: Self-pay | Admitting: Orthopaedic Surgery

## 2019-05-17 DIAGNOSIS — M12572 Traumatic arthropathy, left ankle and foot: Secondary | ICD-10-CM | POA: Diagnosis not present

## 2019-05-17 MED ORDER — OXYCODONE HCL 5 MG PO TABS: 5.0000 mg | ORAL_TABLET | ORAL | 0 refills | Status: DC | PRN

## 2019-05-17 NOTE — Progress Notes (Signed)
Patient ID: [name], female   DOB: December 26, 1970, 48 y.o.   MRN: [id number] Stayed an extra day due to nausea and vomiting. Also low O2 sats.  She is not on oxygen at home and is a long-time smoker.  This is likely her baseline.  Appears comfortable this am off of oxygen.  I stressed the importance of coughing, breathing and her incentive spirometer use.  Can be discharged to home today and patient agrees with this.

## 2019-05-18 ENCOUNTER — Ambulatory Visit (INDEPENDENT_AMBULATORY_CARE_PROVIDER_SITE_OTHER): Payer: Medicaid Other | Admitting: Orthopedic Surgery

## 2019-05-18 ENCOUNTER — Encounter: Payer: Self-pay | Admitting: Orthopedic Surgery

## 2019-05-18 ENCOUNTER — Other Ambulatory Visit: Payer: Self-pay

## 2019-05-18 ENCOUNTER — Telehealth: Payer: Self-pay | Admitting: Orthopedic Surgery

## 2019-05-18 ENCOUNTER — Telehealth: Payer: Self-pay

## 2019-05-18 VITALS — Ht 63.0 in | Wt 200.0 lb

## 2019-05-18 DIAGNOSIS — Z981 Arthrodesis status: Secondary | ICD-10-CM

## 2019-05-18 MED ORDER — OXYCODONE-ACETAMINOPHEN 5-325 MG PO TABS: 1.0000 | ORAL_TABLET | ORAL | 0 refills | Status: DC | PRN

## 2019-05-18 NOTE — Telephone Encounter (Signed)
Patient called requesting pain medication, needing crutches. And needs help with her would vac and its filling up with blood.

## 2019-05-18 NOTE — Telephone Encounter (Signed)
Pt is coming in the office today for an appt. I have documented this on office chart and will call after appt to advise of updated PT orders.

## 2019-05-18 NOTE — Telephone Encounter (Signed)
Patient was called and scheduled for appt with Dr Sharol Given for 05/18/19

## 2019-05-18 NOTE — Telephone Encounter (Signed)
Frankie/[facility]/PT called to stated she needed verbal orders for:  1x week for 4  She fell 2 tiwce an d not maintaining her non weight bearing status.  Please call Selinda Flavin if not available please leave message on vm.

## 2019-05-18 NOTE — Telephone Encounter (Signed)
Calling to make appt for pt today

## 2019-05-19 ENCOUNTER — Encounter: Payer: Self-pay | Admitting: Orthopedic Surgery

## 2019-05-19 ENCOUNTER — Inpatient Hospital Stay: Payer: Medicaid Other | Admitting: Family

## 2019-05-19 NOTE — Telephone Encounter (Signed)
Called to advise the pt will come back in for follow upon Thursday. Left the vac intact pt to continue to be non weight bearing and wear fracture boot. Gave verbal ok for PT orders as requested and to call with any questions.

## 2019-05-19 NOTE — Progress Notes (Signed)
Office Visit Note   Patient: [name]           Date of Birth: 09/12/1971           MRN: [id number] Visit Date: 05/18/2019              Requested by: No referring provider defined for this encounter. PCP: Patient, No Pcp Per  Chief Complaint  Patient presents with  . Left Ankle - Routine Post Op    05/15/19 left ankle fusion      HPI: Patient is a 48 year old woman who presents 3 days status post left ankle fusion.  She was discharged from the hospital on Sunday her home health physical therapy felt that she had increased drainage in the canister and she is seen today urgently.  Assessment & Plan: Visit Diagnoses:  1. S/P ankle fusion     Plan: Patient will continue with physical therapy discussed the importance of nonweightbearing continue with the wound VAC and keeping it plugged in to maintain its charge.  Follow-Up Instructions: Return in about 1 week (around 05/25/2019).   Ortho Exam  Patient is alert, oriented, no adenopathy, well-dressed, normal affect, normal respiratory effort. Examination the wound VAC is functioning well there is less than 50 cc in the wound VAC canister.  There is a good seal and the pump is running appropriately.  We will have her use her fracture boot strict nonweightbearing on the left.  Imaging: No results found. No images are attached to the encounter.  Labs: Lab Results  Component Value Date   HGBA1C 5.1 10/05/2013   HGBA1C 5.5 09/25/2012   HGBA1C 5.5 09/13/2012   ESRSEDRATE 18 01/14/2013   ESRSEDRATE 28 (H) 11/24/2012   ESRSEDRATE 18 10/21/2012   CRP 7.1 (H) 01/14/2013   CRP 1.6 (H) 11/24/2012   CRP 0.6 (H) 10/21/2012   REPTSTATUS 06/29/2017 FINAL 06/27/2017   GRAMSTAIN  09/25/2012    MODERATE WBC PRESENT,BOTH PMN AND MONONUCLEAR NO ORGANISMS SEEN [facility]   GRAMSTAIN  09/25/2012    MODERATE WBC PRESENT,BOTH PMN AND MONONUCLEAR NO ORGANISMS SEEN [facility]   GRAMSTAIN   09/25/2012    MODERATE WBC PRESENT,BOTH PMN AND MONONUCLEAR NO ORGANISMS SEEN   CULT NO GROUP A STREP (S.PYOGENES) ISOLATED 06/27/2017   LABORGA ESCHERICHIA COLI 03/25/2013     Lab Results  Component Value Date   ALBUMIN 3.3 (L) 06/25/2018   ALBUMIN 3.7 09/03/2013   ALBUMIN 3.0 (L) 03/25/2013    Lab Results  Component Value Date   MG 1.7 07/16/2012   MG 2.4 01/09/2011   No results found for: VD25OH  No results found for: PREALBUMIN CBC EXTENDED Latest Ref Rng & Units 05/15/2019 06/25/2018 06/27/2017  WBC 4.0 - 10.5 K/uL 8.6 - -  RBC 3.87 - 5.11 MIL/uL 5.18(H) - -  HGB 12.0 - 15.0 g/dL 40.914.6 81.113.7 91.413.6  HCT 78.236.0 - 46.0 % 45.4 - 40.0  PLT 150 - 400 K/uL 249 - -  NEUTROABS 1.7 - 7.7 K/uL - - -  LYMPHSABS 0.7 - 4.0 K/uL - - -     Body mass index is 35.43 kg/m.  Orders:  No orders of the defined types were placed in this encounter.  Meds ordered this encounter  Medications  . oxyCODONE-acetaminophen (PERCOCET/ROXICET) 5-325 MG tablet    Sig: Take 1 tablet by mouth every 4 (four) hours as needed for severe pain.    Dispense:  30 tablet  Refill:  0     Procedures: No procedures performed  Clinical Data: No additional findings.  ROS:  All other systems negative, except as noted in the HPI. Review of Systems  Objective: Vital Signs: Ht 5\' 3"  (1.6 m)   Wt 200 lb (90.7 kg)   BMI 35.43 kg/m   Specialty Comments:  No specialty comments available.  PMFS History: Patient Active Problem List   Diagnosis Date Noted  . Status post ankle fusion 05/15/2019  . Post-traumatic arthritis of ankle, left   . Traumatic arthritis of left ankle   . Rash and nonspecific skin eruption 10/06/2013  . URI, acute 10/06/2013  . Obesity, unspecified 10/06/2013  . Tobacco use disorder 10/05/2013  . Hoarseness, chronic 09/05/2013  . 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 Bipolar disorder ([place])   . Polysubstance abuse ([place]) 09/13/2012  . Anxiety and depression 07/19/2012  .  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 Anxiety    Panic attacks  . Arthritis   . Bipolar disorder ([place]) 01/21/13  . Chronic pain   . COPD (chronic obstructive pulmonary disease) ([place])   . Depression   . Drug-seeking behavior   . Dyspnea    with much activity  . GERD (gastroesophageal reflux disease)   . Head injury with loss of consciousness ([place])   . 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 Lupus ([place])   . Lupus ([place])   . MRSA (methicillin resistant Staphylococcus aureus)   . Tobacco abuse     Family History  Problem Relation Age of Onset  . Stroke Mother     Past Surgical History:  Procedure Laterality Date  . ANKLE ARTHROSCOPY Left 06/25/2018   Procedure: LEFT ANKLE ARTHROSCOPY;  Surgeon: Newt Minion, MD;  Location: [facility];  Service: Orthopedics;  Laterality: Left;  . ANKLE SURGERY Left   . CHOLECYSTECTOMY    . HARDWARE REMOVAL  09/25/2012   Procedure: HARDWARE REMOVAL;  Surgeon: Jessy Oto, MD;  Location: [facility];  Service: Orthopedics;  Laterality: Left;  Incision, drainage and debridement of left lateral ankle, removal of plate and screws  . TUBAL LIGATION     Social History   Occupational History  . Not on file  Tobacco Use  . Smoking status: Current Every Day Smoker    Packs/day: 0.50    Years: 37.00    Pack years: 18.50    Types: Cigarettes  . Smokeless tobacco: Never Used  Substance and Sexual Activity  . Alcohol use: No  . Drug use: Yes    Types: Marijuana    Comment: 1-2 times a day "none 06/24/18"  . Sexual activity: Yes

## 2019-05-21 ENCOUNTER — Encounter: Payer: Self-pay | Admitting: Orthopedic Surgery

## 2019-05-21 ENCOUNTER — Other Ambulatory Visit: Payer: Self-pay

## 2019-05-21 ENCOUNTER — Ambulatory Visit (INDEPENDENT_AMBULATORY_CARE_PROVIDER_SITE_OTHER): Payer: Medicaid Other | Admitting: Physician Assistant

## 2019-05-21 VITALS — Ht 63.0 in | Wt 200.0 lb

## 2019-05-21 DIAGNOSIS — Z981 Arthrodesis status: Secondary | ICD-10-CM

## 2019-05-21 MED ORDER — OXYCODONE-ACETAMINOPHEN 5-325 MG PO TABS: 1.0000 | ORAL_TABLET | ORAL | 0 refills | Status: DC | PRN

## 2019-05-21 NOTE — Progress Notes (Signed)
Office Visit Note   Patient: [name]           Date of Birth: 30-Sep-1971           MRN: [id number] Visit Date: 05/21/2019              Requested by: No referring provider defined for this encounter. PCP: Patient, No Pcp Per  Chief Complaint  Patient presents with  . Left Ankle - Routine Post Op    05/15/19 left ankle fusion       HPI: The patient is a 48 year old woman who is seen for postoperative follow-up following a left ankle fusion on 05/15/2019.  She is nonweightbearing in a fracture boot and utilizing a wheelchair for the majority of her mobility.  She has had the [facility] in [facility].  She reports moderate to severe pain.  Assessment & Plan: Visit Diagnoses:  1. S/P ankle fusion     Plan: The Praveena VAC was removed.  She should continue to elevate and maintain strict nonweightbearing in her fracture boot.  She can wash the area with soap and water and apply dry dressing and Ace wrap for edema control.  Pain medication was refilled this visit.  Follow-Up Instructions: Return in about 1 week (around 05/28/2019).   Ortho Exam  Patient is alert, oriented, no adenopathy, well-dressed, normal affect, normal respiratory effort. The Praveena VAC was removed.  There is edema and ecchymosis about the ankle.  The sutures are intact but under tension.  She has palpable pedal pulses.  There is no sign of infection or cellulitis.    Imaging: No results found. No images are attached to the encounter.  Labs: Lab Results  Component Value Date   HGBA1C 5.1 10/05/2013   HGBA1C 5.5 09/25/2012   HGBA1C 5.5 09/13/2012   ESRSEDRATE 18 01/14/2013   ESRSEDRATE 28 (H) 11/24/2012   ESRSEDRATE 18 10/21/2012   CRP 7.1 (H) 01/14/2013   CRP 1.6 (H) 11/24/2012   CRP 0.6 (H) 10/21/2012   REPTSTATUS 06/29/2017 FINAL 06/27/2017   GRAMSTAIN  09/25/2012    MODERATE WBC PRESENT,BOTH PMN AND MONONUCLEAR NO ORGANISMS SEEN Performed at [place]  09/25/2012   MODERATE WBC PRESENT,BOTH PMN AND MONONUCLEAR NO ORGANISMS SEEN Performed at [place]  09/25/2012    MODERATE WBC PRESENT,BOTH PMN AND MONONUCLEAR NO ORGANISMS SEEN   CULT NO GROUP A STREP (S.PYOGENES) ISOLATED 06/27/2017   LABORGA ESCHERICHIA COLI 03/25/2013     Lab Results  Component Value Date   ALBUMIN 3.3 (L) 06/25/2018   ALBUMIN 3.7 09/03/2013   ALBUMIN 3.0 (L) 03/25/2013    Lab Results  Component Value Date   MG 1.7 07/16/2012   MG 2.4 01/09/2011   No results found for: VD25OH  No results found for: PREALBUMIN CBC EXTENDED Latest Ref Rng & Units 05/15/2019 06/25/2018 06/27/2017  WBC 4.0 - 10.5 K/uL 8.6 - -  RBC 3.87 - 5.11 MIL/uL 5.18(H) - -  HGB 12.0 - 15.0 g/dL 14.6 13.7 13.6  HCT 36.0 - 46.0 % 45.4 - 40.0  PLT 150 - 400 K/uL 249 - -  NEUTROABS 1.7 - 7.7 K/uL - - -  LYMPHSABS 0.7 - 4.0 K/uL - - -     Body mass index is 35.43 kg/m.  Orders:  No orders of the defined types were placed in this encounter.  Meds ordered this encounter  Medications  . oxyCODONE-acetaminophen (PERCOCET/ROXICET) 5-325 MG tablet  Sig: Take 1 tablet by mouth every 4 (four) hours as needed for severe pain.    Dispense:  30 tablet    Refill:  0     Procedures: No procedures performed  Clinical Data: No additional findings.  ROS:  All other systems negative, except as noted in the HPI. Review of Systems  Objective: Vital Signs: Ht 5\' 3"  (1.6 m)   Wt 200 lb (90.7 kg)   BMI 35.43 kg/m   Specialty Comments:  No specialty comments available.  PMFS History: Patient Active Problem List   Diagnosis Date Noted  . Status post ankle fusion 05/15/2019  . Post-traumatic arthritis of ankle, left   . Traumatic arthritis of left ankle   . Rash and nonspecific skin eruption 10/06/2013  . URI, acute 10/06/2013  . Obesity, unspecified 10/06/2013  . Tobacco use disorder 10/05/2013  . Hoarseness, chronic 09/05/2013  . 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  Bipolar disorder ([facility])   . Polysubstance abuse ([facility]) 09/13/2012  . Anxiety and depression 07/19/2012  . 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 Anxiety    Panic attacks  . Arthritis   . Bipolar disorder ([facility]) 01/21/13  . Chronic pain   . COPD (chronic obstructive pulmonary disease) ([facility])   . Depression   . Drug-seeking behavior   . Dyspnea    with much activity  . GERD (gastroesophageal reflux disease)   . Head injury with loss of consciousness ([facility])   . 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 Lupus ([facility])   . Lupus ([facility])   . MRSA (methicillin resistant Staphylococcus aureus)   . Tobacco abuse     Family History  Problem Relation Age of Onset  . Stroke Mother     Past Surgical History:  Procedure Laterality Date  . ANKLE ARTHROSCOPY Left 06/25/2018   Procedure: LEFT ANKLE ARTHROSCOPY;  Surgeon: Nadara Mustarduda, Marcus V, MD;  [facility];  Service: Orthopedics;  Laterality: Left;  . ANKLE SURGERY Left   . CHOLECYSTECTOMY    . HARDWARE REMOVAL  09/25/2012   Procedure: HARDWARE REMOVAL;  Surgeon: Kerrin ChampagneJames E Nitka, MD;  [facility];  Service: Orthopedics;  Laterality: Left;  Incision, drainage and debridement of left lateral ankle, removal of plate and screws  . TUBAL LIGATION     Social History   Occupational History  . Not on file  Tobacco Use  . Smoking status: Current Every Day Smoker    Packs/day: 0.50    Years: 37.00    Pack years: 18.50    Types: Cigarettes  . Smokeless tobacco: Never Used  Substance and Sexual Activity  . Alcohol use: No  . Drug use: Yes    Types: Marijuana    Comment: 1-2 times a day "none 06/24/18"  . Sexual activity: Yes

## 2019-05-26 ENCOUNTER — Encounter (HOSPITAL_COMMUNITY): Payer: Self-pay | Admitting: Orthopedic Surgery

## 2019-05-28 ENCOUNTER — Other Ambulatory Visit: Payer: Self-pay

## 2019-05-28 ENCOUNTER — Encounter: Payer: Self-pay | Admitting: Physician Assistant

## 2019-05-28 ENCOUNTER — Ambulatory Visit (INDEPENDENT_AMBULATORY_CARE_PROVIDER_SITE_OTHER): Payer: Medicaid Other

## 2019-05-28 ENCOUNTER — Ambulatory Visit (INDEPENDENT_AMBULATORY_CARE_PROVIDER_SITE_OTHER): Payer: Medicaid Other | Admitting: Physician Assistant

## 2019-05-28 VITALS — Ht 63.0 in | Wt 200.0 lb

## 2019-05-28 DIAGNOSIS — Z981 Arthrodesis status: Secondary | ICD-10-CM | POA: Diagnosis not present

## 2019-05-28 DIAGNOSIS — M19172 Post-traumatic osteoarthritis, left ankle and foot: Secondary | ICD-10-CM

## 2019-05-28 MED ORDER — DOXYCYCLINE HYCLATE 100 MG PO CAPS: 100.0000 mg | ORAL_CAPSULE | Freq: Two times a day (BID) | ORAL | 1 refills | Status: DC

## 2019-05-28 MED ORDER — OXYCODONE-ACETAMINOPHEN 5-325 MG PO TABS: 1.0000 | ORAL_TABLET | ORAL | 0 refills | Status: DC | PRN

## 2019-05-29 ENCOUNTER — Encounter: Payer: Self-pay | Admitting: Physician Assistant

## 2019-05-29 NOTE — Progress Notes (Signed)
Office Visit Note   Patient: [name]           Date of Birth: 1971-08-26           MRN: [id number] Visit Date: 05/28/2019              Requested by: No referring provider defined for this encounter. PCP: Patient, No Pcp Per  Chief Complaint  Patient presents with  . Left Foot - Routine Post Op    05/15/19 left ankle fusion      HPI: The patient is a 48 year old female who is seen for postoperative follow-up following left ankle fusion 05/15/2019.  She reports that her pain continues to be 10 out of 10 at times.  She reports she has been elevating as much as possible.  She is also noted some draining over the distal incision area.  Assessment & Plan: Visit Diagnoses:  1. S/P ankle fusion   2. Post-traumatic arthritis of ankle, left     Plan: Begin doxycycline 100 mg p.o. twice daily.  Continue to wash the foot daily with soap and water and apply dry dressings Ace wrap and fracture boot.  Continue strict nonweightbearing.  Pain medication was refilled this visit. Follow up in 1 week.   Follow-Up Instructions: Return in about 1 week (around 06/04/2019).   Ortho Exam  Patient is alert, oriented, no adenopathy, well-dressed, normal affect, normal respiratory effort. Patient has pitting edema of the left lower leg with sutures under tension.  There is yellow drainage mostly serous appearing from the distal incision line from the edema.  There is some mild erythema about the lateral ankle and mild increased warmth.  She has palpable pedal pulses.  She is neurovascularly intact in the toes. Imaging: No results found.   Labs: Lab Results  Component Value Date   HGBA1C 5.1 10/05/2013   HGBA1C 5.5 09/25/2012   HGBA1C 5.5 09/13/2012   ESRSEDRATE 18 01/14/2013   ESRSEDRATE 28 (H) 11/24/2012   ESRSEDRATE 18 10/21/2012   CRP 7.1 (H) 01/14/2013   CRP 1.6 (H) 11/24/2012   CRP 0.6 (H) 10/21/2012   REPTSTATUS 06/29/2017 FINAL 06/27/2017   GRAMSTAIN  09/25/2012    MODERATE WBC  PRESENT,BOTH PMN AND MONONUCLEAR NO ORGANISMS SEEN Performed at [facility]  09/25/2012    MODERATE WBC PRESENT,BOTH PMN AND MONONUCLEAR NO ORGANISMS SEEN Performed at [facility]  09/25/2012    MODERATE WBC PRESENT,BOTH PMN AND MONONUCLEAR NO ORGANISMS SEEN   CULT NO GROUP A STREP (S.PYOGENES) ISOLATED 06/27/2017   LABORGA ESCHERICHIA COLI 03/25/2013     Lab Results  Component Value Date   ALBUMIN 3.3 (L) 06/25/2018   ALBUMIN 3.7 09/03/2013   ALBUMIN 3.0 (L) 03/25/2013    Lab Results  Component Value Date   MG 1.7 07/16/2012   MG 2.4 01/09/2011   No results found for: VD25OH  No results found for: PREALBUMIN CBC EXTENDED Latest Ref Rng & Units 05/15/2019 06/25/2018 06/27/2017  WBC 4.0 - 10.5 K/uL 8.6 - -  RBC 3.87 - 5.11 MIL/uL 5.18(H) - -  HGB 12.0 - 15.0 g/dL 14.6 13.7 13.6  HCT 36.0 - 46.0 % 45.4 - 40.0  PLT 150 - 400 K/uL 249 - -  NEUTROABS 1.7 - 7.7 K/uL - - -  LYMPHSABS 0.7 - 4.0 K/uL - - -     Body mass index is 35.43 kg/m.  Orders:  Orders Placed This Encounter  Procedures  .  XR Ankle Complete Left   Meds ordered this encounter  Medications  . DISCONTD: doxycycline (VIBRAMYCIN) 100 MG capsule    Sig: Take 1 capsule (100 mg total) by mouth 2 (two) times daily.    Dispense:  28 capsule    Refill:  1  . oxyCODONE-acetaminophen (PERCOCET/ROXICET) 5-325 MG tablet    Sig: Take 1 tablet by mouth every 4 (four) hours as needed for severe pain.    Dispense:  30 tablet    Refill:  0  . doxycycline (VIBRAMYCIN) 100 MG capsule    Sig: Take 1 capsule (100 mg total) by mouth 2 (two) times daily.    Dispense:  28 capsule    Refill:  1     Procedures: No procedures performed  Clinical Data: No additional findings.  ROS:  All other systems negative, except as noted in the HPI. Review of Systems  Objective: Vital Signs: Ht 5\' 3"  (1.6 m)   Wt 200 lb (90.7 kg)   BMI 35.43 kg/m   Specialty Comments:  No  specialty comments available.  PMFS History: Patient Active Problem List   Diagnosis Date Noted  . Status post ankle fusion 05/15/2019  . Post-traumatic arthritis of ankle, left   . Traumatic arthritis of left ankle   . Rash and nonspecific skin eruption 10/06/2013  . URI, acute 10/06/2013  . Obesity, unspecified 10/06/2013  . Tobacco use disorder 10/05/2013  . Hoarseness, chronic 09/05/2013  . 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 Bipolar disorder (HCC)   . Polysubstance abuse (HCC) 09/13/2012  . Anxiety and depression 07/19/2012  . 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 Anxiety    Panic attacks  . Arthritis   . Bipolar disorder (HCC) 01/21/13  . Chronic pain   . COPD (chronic obstructive pulmonary disease) (HCC)   . Depression   . Drug-seeking behavior   . Dyspnea    with much activity  . GERD (gastroesophageal reflux disease)   . Head injury with loss of consciousness (HCC)   . 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 Lupus (HCC)   . Lupus (HCC)   . MRSA (methicillin resistant Staphylococcus aureus)   . Tobacco abuse     Family History  Problem Relation Age of Onset  . Stroke Mother     Past Surgical History:  Procedure Laterality Date  . ANKLE ARTHROSCOPY Left 06/25/2018   Procedure: LEFT ANKLE ARTHROSCOPY;  Surgeon: Nadara Mustarduda, Marcus V, MD;  [facility];  Service: Orthopedics;  Laterality: Left;  . ANKLE FUSION Left 05/15/2019   Procedure: LEFT ANKLE FUSION;  Surgeon: Nadara Mustarduda, Marcus V, MD;  [facility];  Service: Orthopedics;  Laterality: Left;  . ANKLE SURGERY Left   . APPLICATION OF WOUND VAC Left 05/15/2019   Procedure: Application Of Wound Vac;  Surgeon: Nadara Mustarduda, Marcus V, MD;  [facility];  Service: Orthopedics;  Laterality: Left;  . CHOLECYSTECTOMY    . HARDWARE REMOVAL  09/25/2012   Procedure: HARDWARE REMOVAL;  Surgeon: Kerrin ChampagneJames E Nitka, MD;  [facility];  Service: Orthopedics;  Laterality: Left;   Incision, drainage and debridement of left lateral ankle, removal of plate and screws  . TUBAL LIGATION     Social History   Occupational History  . Not on file  Tobacco Use  . Smoking status: Current Every Day Smoker    Packs/day: 0.50    Years: 37.00    Pack years: 18.50    Types: Cigarettes  .  Smokeless tobacco: Never Used  Substance and Sexual Activity  . Alcohol use: No  . Drug use: Yes    Types: Marijuana    Comment: 1-2 times a day "none 06/24/18"  . Sexual activity: Yes

## 2019-06-04 ENCOUNTER — Other Ambulatory Visit: Payer: Self-pay

## 2019-06-04 ENCOUNTER — Encounter: Payer: Self-pay | Admitting: Physician Assistant

## 2019-06-04 ENCOUNTER — Ambulatory Visit (INDEPENDENT_AMBULATORY_CARE_PROVIDER_SITE_OTHER): Payer: Medicaid Other | Admitting: Physician Assistant

## 2019-06-04 DIAGNOSIS — Z981 Arthrodesis status: Secondary | ICD-10-CM

## 2019-06-04 MED ORDER — OXYCODONE-ACETAMINOPHEN 5-325 MG PO TABS: 1.0000 | ORAL_TABLET | ORAL | 0 refills | Status: DC | PRN

## 2019-06-04 NOTE — Progress Notes (Signed)
Office Visit Note   Patient: [name]           Date of Birth: Dec 25, 1970           MRN: [id number] Visit Date: 06/04/2019              Requested by: No referring provider defined for this encounter. PCP: Patient, No Pcp Per  Chief Complaint  Patient presents with  . Left Ankle - Routine Post Op    05/15/2019 left ankle fusion      HPI: The patient is a 48 year old woman who is seen for postoperative follow-up following a left ankle fusion on 05/15/2019.  She continues to have reports of severe pain at times.  She has been on an antibiotic, doxycycline for the past week and her drainage has essentially resolved.  She continues to have significant edema.  Assessment & Plan: Visit Diagnoses:  1. S/P ankle fusion     Plan: Reinforced the importance of elevating the left foot higher than the level of her heart as much as possible for edema control.  She should continue doxycycline 100 mg twice daily.  She can continue to wash the foot daily with soap and water and apply dry dressings Ace wrapping in her fracture boot.  She should continue strict nonweightbearing.  Pain medication was refilled this visit and she will follow-up in 1 week. Radiographs at follow-up.  Follow-Up Instructions: Return in about 1 week (around 06/11/2019).   Ortho Exam  Patient is alert, oriented, no adenopathy, well-dressed, normal affect, normal respiratory effort. The patient continues to have pitting edema of the left lower leg with sutures under significant tension.  There is crusting over the incision line and no drainage today.  The mild erythema about the ankle and foot is improved today.  She is neurovascularly intact distally.  She has palpable pedal pulses.  Imaging: No results found. No images are attached to the encounter.  Labs: Lab Results  Component Value Date   HGBA1C 5.1 10/05/2013   HGBA1C 5.5 09/25/2012   HGBA1C 5.5 09/13/2012   ESRSEDRATE 18 01/14/2013   ESRSEDRATE 28 (H)  11/24/2012   ESRSEDRATE 18 10/21/2012   CRP 7.1 (H) 01/14/2013   CRP 1.6 (H) 11/24/2012   CRP 0.6 (H) 10/21/2012   REPTSTATUS 06/29/2017 FINAL 06/27/2017   GRAMSTAIN  09/25/2012    MODERATE WBC PRESENT,BOTH PMN AND MONONUCLEAR NO ORGANISMS SEEN Performed at [facility]  09/25/2012    MODERATE WBC PRESENT,BOTH PMN AND MONONUCLEAR NO ORGANISMS SEEN Performed at [place]  09/25/2012    MODERATE WBC PRESENT,BOTH PMN AND MONONUCLEAR NO ORGANISMS SEEN   CULT NO GROUP A STREP (S.PYOGENES) ISOLATED 06/27/2017   LABORGA ESCHERICHIA COLI 03/25/2013     Lab Results  Component Value Date   ALBUMIN 3.3 (L) 06/25/2018   ALBUMIN 3.7 09/03/2013   ALBUMIN 3.0 (L) 03/25/2013    Lab Results  Component Value Date   MG 1.7 07/16/2012   MG 2.4 01/09/2011   No results found for: VD25OH  No results found for: PREALBUMIN CBC EXTENDED Latest Ref Rng & Units 05/15/2019 06/25/2018 06/27/2017  WBC 4.0 - 10.5 K/uL 8.6 - -  RBC 3.87 - 5.11 MIL/uL 5.18(H) - -  HGB 12.0 - 15.0 g/dL 14.6 13.7 13.6  HCT 36.0 - 46.0 % 45.4 - 40.0  PLT 150 - 400 K/uL 249 - -  NEUTROABS 1.7 - 7.7 K/uL - - -  LYMPHSABS  0.7 - 4.0 K/uL - - -     Body mass index is 35.43 kg/m.  Orders:  No orders of the defined types were placed in this encounter.  Meds ordered this encounter  Medications  . oxyCODONE-acetaminophen (PERCOCET/ROXICET) 5-325 MG tablet    Sig: Take 1 tablet by mouth every 4 (four) hours as needed for severe pain.    Dispense:  30 tablet    Refill:  0     Procedures: No procedures performed  Clinical Data: No additional findings.  ROS:  All other systems negative, except as noted in the HPI. Review of Systems  Objective: Vital Signs: Ht 5\' 3"  (1.6 m)   Wt 200 lb (90.7 kg)   BMI 35.43 kg/m   Specialty Comments:  No specialty comments available.  PMFS History: Patient Active Problem List   Diagnosis Date Noted  . Status post ankle fusion  05/15/2019  . Post-traumatic arthritis of ankle, left   . Traumatic arthritis of left ankle   . Rash and nonspecific skin eruption 10/06/2013  . URI, acute 10/06/2013  . Obesity, unspecified 10/06/2013  . Tobacco use disorder 10/05/2013  . Hoarseness, chronic 09/05/2013  . 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 Bipolar disorder (HCC)   . Polysubstance abuse (HCC) 09/13/2012  . Anxiety and depression 07/19/2012  . 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 Anxiety    Panic attacks  . Arthritis   . Bipolar disorder (HCC) 01/21/13  . Chronic pain   . COPD (chronic obstructive pulmonary disease) (HCC)   . Depression   . Drug-seeking behavior   . Dyspnea    with much activity  . GERD (gastroesophageal reflux disease)   . Head injury with loss of consciousness (HCC)   . 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 Lupus (HCC)   . Lupus (HCC)   . MRSA (methicillin resistant Staphylococcus aureus)   . Tobacco abuse     Family History  Problem Relation Age of Onset  . Stroke Mother     Past Surgical History:  Procedure Laterality Date  . ANKLE ARTHROSCOPY Left 06/25/2018   Procedure: LEFT ANKLE ARTHROSCOPY;  Surgeon: Nadara Mustarduda, Marcus V, MD;  [facility];  Service: Orthopedics;  Laterality: Left;  . ANKLE FUSION Left 05/15/2019   Procedure: LEFT ANKLE FUSION;  Surgeon: Nadara Mustarduda, Marcus V, MD;  [facility];  Service: Orthopedics;  Laterality: Left;  . ANKLE SURGERY Left   . APPLICATION OF WOUND VAC Left 05/15/2019   Procedure: Application Of Wound Vac;  Surgeon: Nadara Mustarduda, Marcus V, MD;  [facility];  Service: Orthopedics;  Laterality: Left;  . CHOLECYSTECTOMY    . HARDWARE REMOVAL  09/25/2012   Procedure: HARDWARE REMOVAL;  Surgeon: Kerrin ChampagneJames E Nitka, MD;  [facility];  Service: Orthopedics;  Laterality: Left;  Incision, drainage and debridement of left lateral ankle, removal of plate and screws  . TUBAL LIGATION     Social History    Occupational History  . Not on file  Tobacco Use  . Smoking status: Current Every Day Smoker    Packs/day: 0.50    Years: 37.00    Pack years: 18.50    Types: Cigarettes  . Smokeless tobacco: Never Used  Substance and Sexual Activity  . Alcohol use: No  . Drug use: Yes    Types: Marijuana    Comment: 1-2 times a day "none 06/24/18"  . Sexual activity: Yes

## 2019-06-05 ENCOUNTER — Encounter: Payer: Self-pay | Admitting: Physician Assistant

## 2019-06-12 ENCOUNTER — Ambulatory Visit (INDEPENDENT_AMBULATORY_CARE_PROVIDER_SITE_OTHER): Payer: Medicaid Other | Admitting: Physician Assistant

## 2019-06-12 ENCOUNTER — Ambulatory Visit (INDEPENDENT_AMBULATORY_CARE_PROVIDER_SITE_OTHER): Payer: Medicaid Other

## 2019-06-12 ENCOUNTER — Encounter: Payer: Self-pay | Admitting: Physician Assistant

## 2019-06-12 VITALS — Ht 63.0 in | Wt 200.0 lb

## 2019-06-12 DIAGNOSIS — Z981 Arthrodesis status: Secondary | ICD-10-CM

## 2019-06-12 MED ORDER — OXYCODONE-ACETAMINOPHEN 5-325 MG PO TABS: 1.0000 | ORAL_TABLET | ORAL | 0 refills | Status: DC | PRN

## 2019-06-12 NOTE — Progress Notes (Signed)
Office Visit Note   Patient: [name]           Date of Birth: 11/01/1971           MRN: [id number] Visit Date: 06/12/2019              Requested by: No referring provider defined for this encounter. PCP: Patient, No Pcp Per  Chief Complaint  Patient presents with  . Left Ankle - Routine Post Op    05/15/19 left ankle fusion       HPI: The patient is a 48 yo woman who is seen for post operative follow up following a left ankle fusion 05/15/2019. She has completed a course of doxycycline and her edema is much improved. She has been non weight bearing in a fracture boot. She is smoking and Dr. Lajoyce Cornersuda discussed with the patient to decrease or quit smoking as much as possible.   Assessment & Plan: Visit Diagnoses:  1. S/P ankle fusion     Plan: DC sutures today. Wear compression stocking. May begin weight bearing as tolerated in fracture boot. Try to quit or decrease cigarette smoking as much as possible. Pain medications refilled.  Follow up in 2 weeks with xrays.   Follow-Up Instructions: Return in about 2 weeks (around 06/26/2019).   Ortho Exam  Patient is alert, oriented, no adenopathy, well-dressed, normal affect, normal respiratory effort. The left ankle incision is clean, dry and intact. No erythema, edema much improved. Will remove sutures today. Palpable pedal pulses. No signs of infection or cellulitis.   Imaging: Xr Ankle Complete Left  Result Date: 06/12/2019 Radiographs of the left ankle show fusion hardware in place with good position and alignment.   No images are attached to the encounter.  Labs: Lab Results  Component Value Date   HGBA1C 5.1 10/05/2013   HGBA1C 5.5 09/25/2012   HGBA1C 5.5 09/13/2012   ESRSEDRATE 18 01/14/2013   ESRSEDRATE 28 (H) 11/24/2012   ESRSEDRATE 18 10/21/2012   CRP 7.1 (H) 01/14/2013   CRP 1.6 (H) 11/24/2012   CRP 0.6 (H) 10/21/2012   REPTSTATUS 06/29/2017 FINAL 06/27/2017   GRAMSTAIN  09/25/2012    MODERATE WBC PRESENT,BOTH  PMN AND MONONUCLEAR NO ORGANISMS SEEN [facility]   GRAMSTAIN  09/25/2012    MODERATE WBC PRESENT,BOTH PMN AND MONONUCLEAR NO ORGANISMS SEEN [facility]   GRAMSTAIN  09/25/2012    MODERATE WBC PRESENT,BOTH PMN AND MONONUCLEAR NO ORGANISMS SEEN   CULT NO GROUP A STREP (S.PYOGENES) ISOLATED 06/27/2017   LABORGA ESCHERICHIA COLI 03/25/2013     Lab Results  Component Value Date   ALBUMIN 3.3 (L) 06/25/2018   ALBUMIN 3.7 09/03/2013   ALBUMIN 3.0 (L) 03/25/2013    Lab Results  Component Value Date   MG 1.7 07/16/2012   MG 2.4 01/09/2011   No results found for: VD25OH  No results found for: PREALBUMIN CBC EXTENDED Latest Ref Rng & Units 05/15/2019 06/25/2018 06/27/2017  WBC 4.0 - 10.5 K/uL 8.6 - -  RBC 3.87 - 5.11 MIL/uL 5.18(H) - -  HGB 12.0 - 15.0 g/dL 40.914.6 81.113.7 91.413.6  HCT 78.236.0 - 46.0 % 45.4 - 40.0  PLT 150 - 400 K/uL 249 - -  NEUTROABS 1.7 - 7.7 K/uL - - -  LYMPHSABS 0.7 - 4.0 K/uL - - -     Body mass index is 35.43 kg/m.  Orders:  Orders Placed This Encounter  Procedures  . XR Ankle Complete Left  Meds ordered this encounter  Medications  . oxyCODONE-acetaminophen (PERCOCET/ROXICET) 5-325 MG tablet    Sig: Take 1 tablet by mouth every 4 (four) hours as needed for severe pain.    Dispense:  30 tablet    Refill:  0     Procedures: No procedures performed  Clinical Data: No additional findings.  ROS:  All other systems negative, except as noted in the HPI. Review of Systems  Objective: Vital Signs: Ht 5\' 3"  (1.6 m)   Wt 200 lb (90.7 kg)   BMI 35.43 kg/m   Specialty Comments:  No specialty comments available.  PMFS History: Patient Active Problem List   Diagnosis Date Noted  . Status post ankle fusion 05/15/2019  . Post-traumatic arthritis of ankle, left   . Traumatic arthritis of left ankle   . Rash and nonspecific skin eruption 10/06/2013  . URI, acute 10/06/2013  . Obesity, unspecified 10/06/2013  .  Tobacco use disorder 10/05/2013  . Hoarseness, chronic 09/05/2013  . Lupus arthritis ([place]) 09/03/2013  . Bipolar disorder ([place])   . Polysubstance abuse ([place]) 09/13/2012  . Anxiety and depression 07/19/2012  . 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 Anxiety    Panic attacks  . Arthritis   . Bipolar disorder ([place]) 01/21/13  . Chronic pain   . COPD (chronic obstructive pulmonary disease) ([place])   . Depression   . Drug-seeking behavior   . Dyspnea    with much activity  . GERD (gastroesophageal reflux disease)   . Head injury with loss of consciousness ([place])   . Leukocytoclastic vasculitis ([place])   . Lupus ([place])   . Lupus ([place])   . MRSA (methicillin resistant Staphylococcus aureus)   . Tobacco abuse     Family History  Problem Relation Age of Onset  . Stroke Mother     Past Surgical History:  Procedure Laterality Date  . ANKLE ARTHROSCOPY Left 06/25/2018   Procedure: LEFT ANKLE ARTHROSCOPY;  Surgeon: Newt Minion, MD;  Location: [facility];  Service: Orthopedics;  Laterality: Left;  . ANKLE FUSION Left 05/15/2019   Procedure: LEFT ANKLE FUSION;  Surgeon: Newt Minion, MD;  Location: [facility];  Service: Orthopedics;  Laterality: Left;  . ANKLE SURGERY Left   . APPLICATION OF WOUND VAC Left 05/15/2019   Procedure: Application Of Wound Vac;  Surgeon: Newt Minion, MD;  Location: [facility];  Service: Orthopedics;  Laterality: Left;  . CHOLECYSTECTOMY    . HARDWARE REMOVAL  09/25/2012   Procedure: HARDWARE REMOVAL;  Surgeon: Jessy Oto, MD;  Location: [facility];  Service: Orthopedics;  Laterality: Left;  Incision, drainage and debridement of left lateral ankle, removal of plate and screws  . TUBAL LIGATION     Social History   Occupational History  . Not on file  Tobacco Use  . Smoking status: Current Every Day Smoker    Packs/day: 0.50    Years: 37.00    Pack years: 18.50    Types: Cigarettes  . Smokeless tobacco: Never Used   Substance and Sexual Activity  . Alcohol use: No  . Drug use: Yes    Types: Marijuana    Comment: 1-2 times a day "none 06/24/18"  . Sexual activity: Yes

## 2019-06-19 ENCOUNTER — Other Ambulatory Visit: Payer: Self-pay | Admitting: Physician Assistant

## 2019-06-19 DIAGNOSIS — Z981 Arthrodesis status: Secondary | ICD-10-CM

## 2019-06-22 ENCOUNTER — Other Ambulatory Visit: Payer: Self-pay | Admitting: Orthopedic Surgery

## 2019-06-22 ENCOUNTER — Telehealth: Payer: Self-pay | Admitting: Orthopedic Surgery

## 2019-06-22 NOTE — Telephone Encounter (Signed)
05/15/19 ankle fusion. Pt had rx for Oxycodone 5/325 #30 on 06/12/19 and 7/16, and 05/28/19 and 05/21/19 and 05/18/19 Also rx for suboxone #84 on 06/04/19. She is requesting refill please advise.

## 2019-06-22 NOTE — Telephone Encounter (Signed)
Patient called and stated that she was requesting a refill of Oxycodone   Please call patient to advise.   [phone]

## 2019-06-22 NOTE — Telephone Encounter (Signed)
She has a prescription from [facility] that was supposed to start on 06/19/2019 but has not been filled yet

## 2019-06-22 NOTE — Telephone Encounter (Signed)
She had this filled already

## 2019-06-22 NOTE — Telephone Encounter (Signed)
Can not refill patient has existing pending prescription from [facility]

## 2019-06-23 NOTE — Telephone Encounter (Signed)
I called pt to advise too soon for refill and states that " no it's not" and asked what time her appt was on Friday I advised 9 am she said thank you and disconnected the call.

## 2019-06-26 ENCOUNTER — Other Ambulatory Visit: Payer: Self-pay

## 2019-06-26 ENCOUNTER — Encounter: Payer: Self-pay | Admitting: Family

## 2019-06-26 ENCOUNTER — Ambulatory Visit (INDEPENDENT_AMBULATORY_CARE_PROVIDER_SITE_OTHER): Payer: Medicaid Other | Admitting: Family

## 2019-06-26 ENCOUNTER — Ambulatory Visit (INDEPENDENT_AMBULATORY_CARE_PROVIDER_SITE_OTHER): Payer: Medicaid Other

## 2019-06-26 VITALS — Ht 63.0 in | Wt 200.0 lb

## 2019-06-26 DIAGNOSIS — Z981 Arthrodesis status: Secondary | ICD-10-CM

## 2019-06-26 DIAGNOSIS — M19172 Post-traumatic osteoarthritis, left ankle and foot: Secondary | ICD-10-CM

## 2019-06-26 MED ORDER — OXYCODONE-ACETAMINOPHEN 5-325 MG PO TABS: 1.0000 | ORAL_TABLET | Freq: Four times a day (QID) | ORAL | 0 refills | Status: DC | PRN

## 2019-06-30 ENCOUNTER — Encounter: Payer: Self-pay | Admitting: Family

## 2019-06-30 NOTE — Progress Notes (Signed)
Post-Op Visit Note   Patient: [name]           Date of Birth: 10/28/1971           MRN: [id number] Visit Date: 06/26/2019 PCP: Patient, No Pcp Per  Chief Complaint:  Chief Complaint  Patient presents with  . Left Ankle - Routine Post Op    05/15/19 left ankle fusion     HPI:  HPI Patient is a 48 year old woman seen today status post left ankle fusion on June 26 of this year.  She continues to complain of pain with ambulation.  She has been walking with a walker.  She has intermittent sharp pain this occurs even at rest. Ortho Exam On examination of her ankle the incision is well-healed there is minimal swelling no erythema no dystrophic changes.  Visit Diagnoses:  1. S/P ankle fusion   2. Post-traumatic arthritis of ankle, left     Plan: Discussed minimizing her weightbearing she will back off and continue with the cam walker follow-up in the office in 3 weeks with repeat radiographs of the left ankle.  Follow-Up Instructions: Return in about 20 days (around 07/16/2019).   Imaging: No results found.  Orders:  Orders Placed This Encounter  Procedures  . XR Ankle Complete Left   Meds ordered this encounter  Medications  . oxyCODONE-acetaminophen (PERCOCET/ROXICET) 5-325 MG tablet    Sig: Take 1 tablet by mouth every 6 (six) hours as needed for severe pain.    Dispense:  28 tablet    Refill:  0     PMFS History: Patient Active Problem List   Diagnosis Date Noted  . Status post ankle fusion 05/15/2019  . Post-traumatic arthritis of ankle, left   . Traumatic arthritis of left ankle   . Rash and nonspecific skin eruption 10/06/2013  . URI, acute 10/06/2013  . Obesity, unspecified 10/06/2013  . Tobacco use disorder 10/05/2013  . Hoarseness, chronic 09/05/2013  . Lupus arthritis ([place]) 09/03/2013  . Bipolar disorder ([place])   . Polysubstance abuse ([place]) 09/13/2012  . Anxiety and depression 07/19/2012  . 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 Anxiety    Panic attacks  . Arthritis   . Bipolar disorder ([place]) 01/21/13  . Chronic pain   . COPD (chronic obstructive pulmonary disease) ([place])   . Depression   . Drug-seeking behavior   . Dyspnea    with much activity  . GERD (gastroesophageal reflux disease)   . Head injury with loss of consciousness ([place])   . Leukocytoclastic vasculitis ([place])   . Lupus ([place])   . Lupus ([place])   . MRSA (methicillin resistant Staphylococcus aureus)   . Tobacco abuse     Family History  Problem Relation Age of Onset  . Stroke Mother     Past Surgical History:  Procedure Laterality Date  . ANKLE ARTHROSCOPY Left 06/25/2018   Procedure: LEFT ANKLE ARTHROSCOPY;  Surgeon: Newt Minion, MD;  Location: [place] [facility];  Service: Orthopedics;  Laterality: Left;  . ANKLE FUSION Left 05/15/2019   Procedure: LEFT ANKLE FUSION;  Surgeon: Newt Minion, MD;  Location: [facility];  Service: Orthopedics;  Laterality: Left;  . ANKLE SURGERY Left   . APPLICATION OF WOUND VAC Left 05/15/2019   Procedure: Application Of Wound Vac;  Surgeon: Newt Minion, MD;  Location: [facility];  Service: Orthopedics;  Laterality: Left;  . CHOLECYSTECTOMY    . HARDWARE REMOVAL  09/25/2012   Procedure: HARDWARE REMOVAL;  Surgeon: Kerrin ChampagneJames E Nitka, MD;  [facility];  Service: Orthopedics;  Laterality: Left;  Incision, drainage and debridement of left lateral ankle, removal of plate and screws  . TUBAL LIGATION     Social History   Occupational History  . Not on file  Tobacco Use  . Smoking status: Current Every Day Smoker    Packs/day: 0.50    Years: 37.00    Pack years: 18.50    Types: Cigarettes  . Smokeless tobacco: Never Used  Substance and Sexual Activity  . Alcohol use: No  . Drug use: Yes    Types: Marijuana    Comment: 1-2 times a day "none 06/24/18"  . Sexual activity: Yes

## 2019-07-09 ENCOUNTER — Telehealth: Payer: Self-pay | Admitting: Orthopedic Surgery

## 2019-07-09 ENCOUNTER — Other Ambulatory Visit: Payer: Self-pay | Admitting: Orthopedic Surgery

## 2019-07-09 ENCOUNTER — Other Ambulatory Visit: Payer: Self-pay | Admitting: Family

## 2019-07-09 DIAGNOSIS — Z981 Arthrodesis status: Secondary | ICD-10-CM

## 2019-07-09 MED ORDER — OXYCODONE-ACETAMINOPHEN 5-325 MG PO TABS: 1.0000 | ORAL_TABLET | Freq: Four times a day (QID) | ORAL | 0 refills | Status: DC | PRN

## 2019-07-09 NOTE — Telephone Encounter (Signed)
Dr Sharol Given filled Rx and patient pharmacy was called and they stated that patient would have to pay out of pocket for Rx. Patient was there in the parking lot per pharmacist stated.

## 2019-07-09 NOTE — Telephone Encounter (Signed)
This was reordered by erin yesterday

## 2019-07-09 NOTE — Telephone Encounter (Signed)
Pt called in requesting a refill on oxyCODONE-acetaminophen  And please have that sent to [facility]

## 2019-07-09 NOTE — Telephone Encounter (Signed)
Dr Duda, please advise. Thank you. 

## 2019-07-09 NOTE — Telephone Encounter (Signed)
Do you want to refill? 

## 2019-07-09 NOTE — Telephone Encounter (Signed)
rx sent

## 2019-07-09 NOTE — Telephone Encounter (Signed)
Patient called advised the Rx for Oxycodone is not showing received at the pharmacy yet. Patient said the pharmacy closes at 6:00pm   The number to contact patient is [phone]

## 2019-07-10 NOTE — Telephone Encounter (Signed)
Rx was refilled 07/09/2019

## 2019-07-13 NOTE — Telephone Encounter (Signed)
Patient medication was already approved.

## 2019-07-16 ENCOUNTER — Ambulatory Visit (INDEPENDENT_AMBULATORY_CARE_PROVIDER_SITE_OTHER): Payer: Medicaid Other

## 2019-07-16 ENCOUNTER — Ambulatory Visit (INDEPENDENT_AMBULATORY_CARE_PROVIDER_SITE_OTHER): Payer: Medicaid Other | Admitting: Orthopedic Surgery

## 2019-07-16 ENCOUNTER — Encounter: Payer: Self-pay | Admitting: Orthopedic Surgery

## 2019-07-16 VITALS — Ht 63.0 in | Wt 200.0 lb

## 2019-07-16 DIAGNOSIS — M19172 Post-traumatic osteoarthritis, left ankle and foot: Secondary | ICD-10-CM

## 2019-07-16 DIAGNOSIS — Z981 Arthrodesis status: Secondary | ICD-10-CM

## 2019-07-16 MED ORDER — OXYCODONE-ACETAMINOPHEN 5-325 MG PO TABS: 1.0000 | ORAL_TABLET | Freq: Four times a day (QID) | ORAL | 0 refills | Status: DC | PRN

## 2019-07-17 ENCOUNTER — Encounter: Payer: Self-pay | Admitting: Orthopedic Surgery

## 2019-07-17 NOTE — Progress Notes (Signed)
Office Visit Note   Patient: [name]           Date of Birth: [birth year]           MRN: [id number] Visit Date: 07/16/2019              Requested by: No referring provider defined for this encounter. PCP: Patient, No Pcp Per  Chief Complaint  Patient presents with  . Left Ankle - Routine Post Op    05/15/19 left ankle fusion       HPI: Patient is a 48 year old woman who presents 2 months status post left ankle fusion.  She is currently ambulating with a walker and in a fracture boot she states she cannot weight-bear without the fracture boot.  She denies any foot or heel pain.  Assessment & Plan: Visit Diagnoses:  1. Post-traumatic arthritis of ankle, left   2. S/P ankle fusion     Plan: Patient will continue to increase her activities as tolerated continue with the fracture boot wean off the walker repeat radiographs of the left ankle at follow-up.  Follow-Up Instructions: Return in about 4 weeks (around 08/13/2019).   Ortho Exam  Patient is alert, oriented, no adenopathy, well-dressed, normal affect, normal respiratory effort. Examination the incision is well-healed there is no redness no cellulitis her foot is plantigrade.  Patient complains of pain over the proximal anterior aspect of the tibia this may be due to pressure from the fracture boot.  We will place a 716 2 inch heel lift in the boot to unload pressure over the proximal tibia.  Imaging: No results found. No images are attached to the encounter.  Labs: Lab Results  Component Value Date   HGBA1C 5.1 10/05/2013   HGBA1C 5.5 09/25/2012   HGBA1C 5.5 09/13/2012   ESRSEDRATE 18 01/14/2013   ESRSEDRATE 28 (H) 11/24/2012   ESRSEDRATE 18 10/21/2012   CRP 7.1 (H) 01/14/2013   CRP 1.6 (H) 11/24/2012   CRP 0.6 (H) 10/21/2012   REPTSTATUS 06/29/2017 FINAL 06/27/2017   GRAMSTAIN  09/25/2012    MODERATE WBC PRESENT,BOTH PMN AND MONONUCLEAR NO ORGANISMS SEEN Performed at [facility]   09/25/2012    MODERATE WBC PRESENT,BOTH PMN AND MONONUCLEAR NO ORGANISMS SEEN Performed at [facility]  09/25/2012    MODERATE WBC PRESENT,BOTH PMN AND MONONUCLEAR NO ORGANISMS SEEN   CULT NO GROUP A STREP (S.PYOGENES) ISOLATED 06/27/2017   LABORGA ESCHERICHIA COLI 03/25/2013     Lab Results  Component Value Date   ALBUMIN 3.3 (L) 06/25/2018   ALBUMIN 3.7 09/03/2013   ALBUMIN 3.0 (L) 03/25/2013    Lab Results  Component Value Date   MG 1.7 07/16/2012   MG 2.4 01/09/2011   No results found for: VD25OH  No results found for: PREALBUMIN CBC EXTENDED Latest Ref Rng & Units 05/15/2019 06/25/2018 06/27/2017  WBC 4.0 - 10.5 K/uL 8.6 - -  RBC 3.87 - 5.11 MIL/uL 5.18(H) - -  HGB 12.0 - 15.0 g/dL 14.6 13.7 13.6  HCT 36.0 - 46.0 % 45.4 - 40.0  PLT 150 - 400 K/uL 249 - -  NEUTROABS 1.7 - 7.7 K/uL - - -  LYMPHSABS 0.7 - 4.0 K/uL - - -     Body mass index is 35.43 kg/m.  Orders:  Orders Placed This Encounter  Procedures  . XR Ankle Complete Left   Meds ordered this encounter  Medications  . oxyCODONE-acetaminophen (PERCOCET/ROXICET) 5-325 MG tablet  Sig: Take 1 tablet by mouth every 6 (six) hours as needed for severe pain.    Dispense:  28 tablet    Refill:  0     Procedures: No procedures performed  Clinical Data: No additional findings.  ROS:  All other systems negative, except as noted in the HPI. Review of Systems  Objective: Vital Signs: Ht 5\' 3"  (1.6 m)   Wt 200 lb (90.7 kg)   BMI 35.43 kg/m   Specialty Comments:  No specialty comments available.  PMFS History: Patient Active Problem List   Diagnosis Date Noted  . Status post ankle fusion 05/15/2019  . Post-traumatic arthritis of ankle, left   . Traumatic arthritis of left ankle   . Rash and nonspecific skin eruption 10/06/2013  . URI, acute 10/06/2013  . Obesity, unspecified 10/06/2013  . Tobacco use disorder 10/05/2013  . Hoarseness, chronic 09/05/2013  . Lupus arthritis  (HCC) 09/03/2013  . Bipolar disorder (HCC)   . Polysubstance abuse (HCC) 09/13/2012  . Anxiety and depression 07/19/2012  . 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 Anxiety    Panic attacks  . Arthritis   . Bipolar disorder (HCC) 01/21/13  . Chronic pain   . COPD (chronic obstructive pulmonary disease) (HCC)   . Depression   . Drug-seeking behavior   . Dyspnea    with much activity  . GERD (gastroesophageal reflux disease)   . Head injury with loss of consciousness (HCC)   . 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 Lupus (HCC)   . Lupus (HCC)   . MRSA (methicillin resistant Staphylococcus aureus)   . Tobacco abuse     Family History  Problem Relation Age of Onset  . Stroke Mother     Past Surgical History:  Procedure Laterality Date  . ANKLE ARTHROSCOPY Left 06/25/2018   Procedure: LEFT ANKLE ARTHROSCOPY;  Surgeon: Nadara Mustarduda, Shantay Sonn V, MD;  [facility];  Service: Orthopedics;  Laterality: Left;  . ANKLE FUSION Left 05/15/2019   Procedure: LEFT ANKLE FUSION;  Surgeon: Nadara Mustarduda, Ivelisse Culverhouse V, MD;  [facility];  Service: Orthopedics;  Laterality: Left;  . ANKLE SURGERY Left   . APPLICATION OF WOUND VAC Left 05/15/2019   Procedure: Application Of Wound Vac;  Surgeon: Nadara Mustarduda, Mildred Bollard V, MD;  [facility];  Service: Orthopedics;  Laterality: Left;  . CHOLECYSTECTOMY    . HARDWARE REMOVAL  09/25/2012   Procedure: HARDWARE REMOVAL;  Surgeon: Kerrin ChampagneJames E Nitka, MD;  [facility];  Service: Orthopedics;  Laterality: Left;  Incision, drainage and debridement of left lateral ankle, removal of plate and screws  . TUBAL LIGATION     Social History   Occupational History  . Not on file  Tobacco Use  . Smoking status: Current Every Day Smoker    Packs/day: 0.50    Years: 37.00    Pack years: 18.50    Types: Cigarettes  . Smokeless tobacco: Never Used  Substance and Sexual Activity  . Alcohol use: No  . Drug use: Yes    Types: Marijuana     Comment: 1-2 times a day "none 06/24/18"  . Sexual activity: Yes

## 2019-07-24 ENCOUNTER — Other Ambulatory Visit: Payer: Self-pay | Admitting: Orthopedic Surgery

## 2019-07-24 DIAGNOSIS — Z981 Arthrodesis status: Secondary | ICD-10-CM

## 2019-07-24 MED ORDER — OXYCODONE-ACETAMINOPHEN 5-325 MG PO TABS: 1.0000 | ORAL_TABLET | Freq: Four times a day (QID) | ORAL | 0 refills | Status: DC | PRN

## 2019-07-24 NOTE — Telephone Encounter (Signed)
rx sent

## 2019-07-24 NOTE — Telephone Encounter (Signed)
Do you want to refill? 

## 2019-07-31 ENCOUNTER — Other Ambulatory Visit: Payer: Self-pay | Admitting: Orthopedic Surgery

## 2019-07-31 ENCOUNTER — Telehealth: Payer: Self-pay | Admitting: Orthopedic Surgery

## 2019-07-31 DIAGNOSIS — Z981 Arthrodesis status: Secondary | ICD-10-CM

## 2019-07-31 NOTE — Telephone Encounter (Signed)
Patient called to inquire about her refill authorization from the pharmacy.  Patient wanted to know if Dr. Sharol Given would authorize the refill before the end of today.  Patient was advised that Dr. Sharol Given is not in the office.  CB#[phone].  Thank you.

## 2019-07-31 NOTE — Telephone Encounter (Signed)
Thank you and I will try to get this authorized through [facility].

## 2019-07-31 NOTE — Telephone Encounter (Signed)
Sorry narcotic score 660. Past suboxone pt , will have to hold for duda. thanks

## 2019-07-31 NOTE — Telephone Encounter (Signed)
See note from Dr Leandrew Koyanagi for Dr Sharol Given

## 2019-07-31 NOTE — Telephone Encounter (Signed)
Please advise Dr Lorin Mercy. Last Rx prescribed by Dr Sharol Given on 07/24/2019 for Qty#28 for surgery on 05/15/2019 for left ankle fusion. Thank you

## 2019-08-03 NOTE — Telephone Encounter (Signed)
Per Dr Lorin Mercy denied medication refill due to score, will hold for Dr Sharol Given.

## 2019-08-04 NOTE — Telephone Encounter (Signed)
Do you want to refill? 

## 2019-08-04 NOTE — Telephone Encounter (Signed)
05/15/19 left ankle fusion. Pt is calling for a refill on pain medication. Last refill was 07/24/19 Oxycodone 5/325 #28  Also received on 8/27 and 8/20 Pt receives Suboxone #84 07/02/19 this is a monthly rx for her. Please advise.

## 2019-08-05 ENCOUNTER — Other Ambulatory Visit: Payer: Self-pay | Admitting: Physician Assistant

## 2019-08-05 DIAGNOSIS — Z981 Arthrodesis status: Secondary | ICD-10-CM

## 2019-08-13 ENCOUNTER — Ambulatory Visit (INDEPENDENT_AMBULATORY_CARE_PROVIDER_SITE_OTHER): Payer: Medicaid Other | Admitting: Orthopedic Surgery

## 2019-08-13 ENCOUNTER — Ambulatory Visit: Payer: Self-pay

## 2019-08-13 ENCOUNTER — Encounter: Payer: Self-pay | Admitting: Orthopedic Surgery

## 2019-08-13 VITALS — Ht 63.0 in | Wt 200.0 lb

## 2019-08-13 DIAGNOSIS — Z981 Arthrodesis status: Secondary | ICD-10-CM | POA: Diagnosis not present

## 2019-08-13 DIAGNOSIS — M79672 Pain in left foot: Secondary | ICD-10-CM | POA: Diagnosis not present

## 2019-08-13 MED ORDER — OXYCODONE-ACETAMINOPHEN 5-325 MG PO TABS: 1.0000 | ORAL_TABLET | Freq: Four times a day (QID) | ORAL | 0 refills | Status: DC | PRN

## 2019-08-18 ENCOUNTER — Encounter: Payer: Self-pay | Admitting: Orthopedic Surgery

## 2019-08-18 NOTE — Progress Notes (Signed)
Office Visit Note   Patient: [name]           Date of Birth: [birth year]           MRN: [id number] Visit Date: 08/13/2019              Requested by: No referring provider defined for this encounter. PCP: Patient, No Pcp Per  Chief Complaint  Patient presents with  . Left Ankle - Follow-up      HPI: Patient is a 48 year old woman who is status post left ankle fusion.  She complains of a painful mass on the plantar aspect of the left foot she states that she has some swelling around the ankle she stated her foot is warm to the touch pain with using the fracture boot for ambulation.  Assessment & Plan: Visit Diagnoses:  1. S/P ankle fusion   2. Left foot pain     Plan: Recommended discontinuing the fracture boot advance to a stiff soled sneaker such as new balance.  Recommended scar massage for the plantar fascia.  Follow-Up Instructions: No follow-ups on file.   Ortho Exam  Patient is alert, oriented, no adenopathy, well-dressed, normal affect, normal respiratory effort. Examination patient's foot is not red not swollen no signs of infection.  The ankle fusion is stable surgical incision is well-healed.  Patient does have some fibrous thickening of the plantar fascia mid substance no tenderness to palpation of the origin of the plantar fascia.  Imaging: No results found. No images are attached to the encounter.  Labs: Lab Results  Component Value Date   HGBA1C 5.1 10/05/2013   HGBA1C 5.5 09/25/2012   HGBA1C 5.5 09/13/2012   ESRSEDRATE 18 01/14/2013   ESRSEDRATE 28 (H) 11/24/2012   ESRSEDRATE 18 10/21/2012   CRP 7.1 (H) 01/14/2013   CRP 1.6 (H) 11/24/2012   CRP 0.6 (H) 10/21/2012   REPTSTATUS 06/29/2017 FINAL 06/27/2017   GRAMSTAIN  09/25/2012    MODERATE WBC PRESENT,BOTH PMN AND MONONUCLEAR NO ORGANISMS SEEN Performed at [facility]   GRAMSTAIN  09/25/2012    MODERATE WBC PRESENT,BOTH PMN AND MONONUCLEAR NO ORGANISMS SEEN Performed at [facility]   GRAMSTAIN  09/25/2012    MODERATE WBC PRESENT,BOTH PMN AND MONONUCLEAR NO ORGANISMS SEEN   CULT NO GROUP A STREP (S.PYOGENES) ISOLATED 06/27/2017   LABORGA ESCHERICHIA COLI 03/25/2013     Lab Results  Component Value Date   ALBUMIN 3.3 (L) 06/25/2018   ALBUMIN 3.7 09/03/2013   ALBUMIN 3.0 (L) 03/25/2013    Lab Results  Component Value Date   MG 1.7 07/16/2012   MG 2.4 01/09/2011   No results found for: VD25OH  No results found for: PREALBUMIN CBC EXTENDED Latest Ref Rng & Units 05/15/2019 06/25/2018 06/27/2017  WBC 4.0 - 10.5 K/uL 8.6 - -  RBC 3.87 - 5.11 MIL/uL 5.18(H) - -  HGB 12.0 - 15.0 g/dL 17.6 16.0 73.7  HCT 10.6 - 46.0 % 45.4 - 40.0  PLT 150 - 400 K/uL 249 - -  NEUTROABS 1.7 - 7.7 K/uL - - -  LYMPHSABS 0.7 - 4.0 K/uL - - -     Body mass index is 35.43 kg/m.  Orders:  Orders Placed This Encounter  Procedures  . XR Foot Complete Left  . XR Ankle Complete Left   Meds ordered this encounter  Medications  . oxyCODONE-acetaminophen (PERCOCET/ROXICET) 5-325 MG tablet    Sig: Take 1 tablet by mouth every 6 (six) hours as needed  for severe pain.    Dispense:  28 tablet    Refill:  0     Procedures: No procedures performed  Clinical Data: No additional findings.  ROS:  All other systems negative, except as noted in the HPI. Review of Systems  Objective: Vital Signs: Ht 5\' 3"  (1.6 m)   Wt 200 lb (90.7 kg)   BMI 35.43 kg/m   Specialty Comments:  No specialty comments available.  PMFS History: Patient Active Problem List   Diagnosis Date Noted  . Status post ankle fusion 05/15/2019  . Post-traumatic arthritis of ankle, left   . Traumatic arthritis of left ankle   . Rash and nonspecific skin eruption 10/06/2013  . URI, acute 10/06/2013  . Obesity, unspecified 10/06/2013  . Tobacco use disorder 10/05/2013  . Hoarseness, chronic 09/05/2013  . Lupus arthritis ([place]) 09/03/2013  . Bipolar disorder ([place])   . Polysubstance abuse ([place])  09/13/2012  . Anxiety and depression 07/19/2012  . 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 Anxiety    Panic attacks  . Arthritis   . Bipolar disorder ([place]) 01/21/13  . Chronic pain   . COPD (chronic obstructive pulmonary disease) ([place])   . Depression   . Drug-seeking behavior   . Dyspnea    with much activity  . GERD (gastroesophageal reflux disease)   . Head injury with loss of consciousness ([place])   . Leukocytoclastic vasculitis ([place])   . Lupus ([place])   . Lupus ([place])   . MRSA (methicillin resistant Staphylococcus aureus)   . Tobacco abuse     Family History  Problem Relation Age of Onset  . Stroke Mother     Past Surgical History:  Procedure Laterality Date  . ANKLE ARTHROSCOPY Left 06/25/2018   Procedure: LEFT ANKLE ARTHROSCOPY;  Surgeon: Newt Minion, MD;  Location: [facility];  Service: Orthopedics;  Laterality: Left;  . ANKLE FUSION Left 05/15/2019   Procedure: LEFT ANKLE FUSION;  Surgeon: Newt Minion, MD;  Location: [place];  Service: Orthopedics;  Laterality: Left;  . ANKLE SURGERY Left   . APPLICATION OF WOUND VAC Left 05/15/2019   Procedure: Application Of Wound Vac;  Surgeon: Newt Minion, MD;  Location: [place];  Service: Orthopedics;  Laterality: Left;  . CHOLECYSTECTOMY    . HARDWARE REMOVAL  09/25/2012   Procedure: HARDWARE REMOVAL;  Surgeon: Jessy Oto, MD;  Location: [place];  Service: Orthopedics;  Laterality: Left;  Incision, drainage and debridement of left lateral ankle, removal of plate and screws  . TUBAL LIGATION     Social History   Occupational History  . Not on file  Tobacco Use  . Smoking status: Current Every Day Smoker    Packs/day: 0.50    Years: 37.00    Pack years: 18.50    Types: Cigarettes  . Smokeless tobacco: Never Used  Substance and Sexual Activity  . Alcohol use: No  . Drug use: Yes    Types: Marijuana    Comment: 1-2 times a day "none 06/24/18"  . Sexual activity: Yes

## 2019-08-20 ENCOUNTER — Other Ambulatory Visit: Payer: Self-pay | Admitting: Orthopedic Surgery

## 2019-08-20 DIAGNOSIS — Z981 Arthrodesis status: Secondary | ICD-10-CM

## 2019-08-20 MED ORDER — OXYCODONE-ACETAMINOPHEN 5-325 MG PO TABS: 1.0000 | ORAL_TABLET | Freq: Four times a day (QID) | ORAL | 0 refills | Status: DC | PRN

## 2019-08-20 NOTE — Telephone Encounter (Signed)
Dr Duda, please advise, thank you 

## 2019-08-27 ENCOUNTER — Other Ambulatory Visit: Payer: Self-pay | Admitting: Orthopedic Surgery

## 2019-08-27 ENCOUNTER — Telehealth: Payer: Self-pay | Admitting: Orthopedic Surgery

## 2019-08-27 DIAGNOSIS — Z981 Arthrodesis status: Secondary | ICD-10-CM

## 2019-08-27 MED ORDER — OXYCODONE-ACETAMINOPHEN 5-325 MG PO TABS: 1.0000 | ORAL_TABLET | Freq: Three times a day (TID) | ORAL | 0 refills | Status: DC | PRN

## 2019-08-27 NOTE — Telephone Encounter (Signed)
rx written

## 2019-08-27 NOTE — Telephone Encounter (Signed)
completed

## 2019-08-27 NOTE — Telephone Encounter (Signed)
Patient called requesting an RX refill on her Oxycodone (Percocet) and wanted to know if it could be sent in by 2 o'clock because she needs to have it delivered and the pharmacy would have to have it by that time.  Patient uses the [facility].  CB#[phone].  Thank you.

## 2019-08-27 NOTE — Telephone Encounter (Signed)
Dr Duda, please advise, thank you 

## 2019-08-27 NOTE — Telephone Encounter (Signed)
Please advise 

## 2019-09-04 ENCOUNTER — Telehealth: Payer: Self-pay | Admitting: Orthopedic Surgery

## 2019-09-04 ENCOUNTER — Other Ambulatory Visit: Payer: Self-pay | Admitting: Orthopedic Surgery

## 2019-09-04 DIAGNOSIS — Z981 Arthrodesis status: Secondary | ICD-10-CM

## 2019-09-04 MED ORDER — OXYCODONE-ACETAMINOPHEN 5-325 MG PO TABS: 1.0000 | ORAL_TABLET | Freq: Three times a day (TID) | ORAL | 0 refills | Status: DC | PRN

## 2019-09-04 NOTE — Telephone Encounter (Signed)
Please advise, thank you.

## 2019-09-04 NOTE — Telephone Encounter (Signed)
Patient called to request an RX refill on her Oxycodone.  Patient uses [facility].  CB#[phone].  Thank you

## 2019-09-04 NOTE — Telephone Encounter (Signed)
rx sent

## 2019-09-10 ENCOUNTER — Ambulatory Visit: Payer: Medicaid Other | Admitting: Orthopedic Surgery

## 2019-09-14 ENCOUNTER — Other Ambulatory Visit: Payer: Self-pay | Admitting: Orthopedic Surgery

## 2019-09-14 DIAGNOSIS — Z981 Arthrodesis status: Secondary | ICD-10-CM

## 2019-09-16 ENCOUNTER — Telehealth: Payer: Self-pay | Admitting: Orthopedic Surgery

## 2019-09-16 NOTE — Telephone Encounter (Signed)
Message sent in error

## 2019-09-16 NOTE — Telephone Encounter (Signed)
Erin please advise.

## 2019-09-18 ENCOUNTER — Encounter: Payer: Self-pay | Admitting: Specialist

## 2019-09-18 ENCOUNTER — Ambulatory Visit (INDEPENDENT_AMBULATORY_CARE_PROVIDER_SITE_OTHER): Payer: Medicaid Other

## 2019-09-18 ENCOUNTER — Ambulatory Visit (INDEPENDENT_AMBULATORY_CARE_PROVIDER_SITE_OTHER): Payer: Medicaid Other | Admitting: Specialist

## 2019-09-18 ENCOUNTER — Other Ambulatory Visit: Payer: Self-pay

## 2019-09-18 VITALS — BP 131/91 | HR 96 | Ht 63.0 in | Wt 200.0 lb

## 2019-09-18 DIAGNOSIS — L03115 Cellulitis of right lower limb: Secondary | ICD-10-CM

## 2019-09-18 DIAGNOSIS — L89511 Pressure ulcer of right ankle, stage 1: Secondary | ICD-10-CM

## 2019-09-18 DIAGNOSIS — M25571 Pain in right ankle and joints of right foot: Secondary | ICD-10-CM | POA: Diagnosis not present

## 2019-09-18 MED ORDER — DOXYCYCLINE HYCLATE 100 MG PO TBEC: 100.0000 mg | DELAYED_RELEASE_TABLET | Freq: Two times a day (BID) | ORAL | 0 refills | Status: DC

## 2019-09-18 NOTE — Patient Instructions (Signed)
May use cane, crutches or a walker to decrease weight on the right foot and ankle. Elevate the right foot to decrease tendency to swell. No ice. Leave dressing intact. Start doxycycline 100 mg po every 12 hours for 2 weeks than discontinue, may need to use  Yogurt or probiotics if diarrhea occurs. Return Monday for reevaluation.

## 2019-09-18 NOTE — Progress Notes (Signed)
Office Visit Note   Patient: [name]           Date of Birth: 15-Mar-1971           MRN: [id number] Visit Date: 09/18/2019              Requested by: No referring provider defined for this encounter. PCP: Patient, No Pcp Per   Assessment & Plan: Visit Diagnoses:  1. Pain in right ankle and joints of right foot   2. Cellulitis of right lower limb   3. Pressure injury of right ankle, stage 1     Plan: May use cane, crutches or a walker to decrease weight on the right foot and ankle. Elevate the right foot to decrease tendency to swell. No ice. Leave dressing intact. Start doxycycline 100 mg po every 12 hours for 2 weeks than discontinue, may need to use  Yogurt or probiotics if diarrhea occurs. Return Monday for reevaluation.   Follow-Up Instructions: Return in about 3 days (around 09/21/2019).   Orders:  Orders Placed This Encounter  Procedures   XR Ankle Complete Right   No orders of the defined types were placed in this encounter.     Procedures: No procedures performed   Clinical Data: No additional findings.   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 In 2013 she presented with left lateral ankle drainage and underwent removal of hardware and I and D and was treated with IV antibiotics. She had persistent pain and most recently Dr. Sharol Given performed a left ankle fusion 05/15/2019.  She is healing the left ankle fusion well and Dr. Sharol Given is planning eventual discharge. Unfortunately 1 week ago she awoke with lateral right ankle pain and an area of redness over the right  lateral malleolus. She saw Dr. Sharol Given 9/26 and the drainage and changes in the right ankle began one week ago. She has has been walking with a boot on the left. She has not been applying any sauve to the right ankle or taking any medication for the ankle area. She requests a prescription for more pain medication at the end of visit today. I noted that she received recent Rx for #14 tablets from [facility] to be taken BID. I will reassess on Monday at her return visit.    Review of Systems  Constitutional: Negative.  Negative for activity change, appetite change, chills, diaphoresis, fatigue, fever and unexpected weight change.  HENT: Negative.  Negative for congestion, dental problem, drooling, ear discharge, ear pain, facial swelling, hearing loss, mouth sores, nosebleeds, postnasal drip, rhinorrhea, sinus pressure, sinus pain, sneezing, sore throat, tinnitus and trouble swallowing.   Eyes: Negative.   Respiratory: Positive for apnea, shortness of breath and wheezing (COPD, smokes 1/2 ppd.).   Cardiovascular: Negative.   Gastrointestinal: Negative for abdominal distention, abdominal pain, anal bleeding, blood in stool, constipation, diarrhea, nausea and vomiting.  Endocrine: Negative.   Genitourinary: Negative.  Negative for difficulty urinating, dyspareunia, dysuria, enuresis, flank pain, frequency, hematuria and pelvic pain.  Musculoskeletal: Negative.  Negative for arthralgias, back pain, gait problem, joint swelling, myalgias, neck pain and  neck stiffness.  Skin: Negative.  Negative for color change, pallor, rash and wound.  Allergic/Immunologic: Negative for environmental allergies, food allergies and immunocompromised state.  Neurological: Negative for dizziness, tremors, seizures, syncope, facial asymmetry, speech difficulty, weakness, light-headedness, numbness and headaches.  Hematological: Negative.  Negative for adenopathy. Does not bruise/bleed easily.  Psychiatric/Behavioral: Negative.   Negative for agitation, behavioral problems, confusion, decreased concentration, dysphoric mood, hallucinations, self-injury, sleep disturbance and suicidal ideas. The patient is not nervous/anxious and is not hyperactive.      Objective: Vital Signs: BP (!) 131/91 (BP Location: Left Arm, Patient Position: Sitting)    Pulse 96    Ht 5\' 3"  (1.6 m)    Wt 200 lb (90.7 kg)    BMI 35.43 kg/m   Physical Exam Constitutional:      Appearance: She is well-developed.  HENT:     Head: Normocephalic and atraumatic.  Eyes:     Pupils: Pupils are equal, round, and reactive to light.  Neck:     Musculoskeletal: Normal range of motion and neck supple.  Pulmonary:     Effort: Pulmonary effort is normal.     Breath sounds: Normal breath sounds.  Abdominal:     General: Bowel sounds are normal.     Palpations: Abdomen is soft.  Skin:    General: Skin is warm and dry.  Neurological:     Mental Status: She is alert and oriented to person, place, and time.  Psychiatric:        Behavior: Behavior normal.        Thought Content: Thought content normal.        Judgment: Judgment normal.     Right Ankle Exam   Tenderness  The patient is experiencing tenderness in the lateral malleolus. Swelling: mild  Range of Motion  Dorsiflexion:  0 abnormal  Plantar flexion:  0 abnormal  Eversion:  0 abnormal  Inversion:  0 abnormal   Muscle Strength  Dorsiflexion:  4/5 Plantar flexion:  4/5 Anterior tibial:  4/5 Posterior tibial:  4/5 Gastrocsoleus:  4/5 Peroneal muscle:  4/5  Tests  Anterior drawer: negative Varus tilt: negative  Other  Erythema: absent Scars: present   Comments:  1 inch diameter area of bullus formation over the right lateral malleolus, no definite purulence appearance as seen with decubitus with skin bulla no tracking proximally noted.  Painted area with betadiene, then used a sharp sterile scissors to remove small amount of skin at the inferior bulla then applied  mupirocin oint and agCell and a 2x2. ABD pads with circle cut out to relieve pressure on the right lateral malleolus then kerlix and ace wrap applied.  A swab of the area open wound  Taken and sent for C&S.       Specialty Comments:  No specialty comments available.  Imaging: Xr Ankle Complete Right  Result Date: 09/18/2019 AP and lateral and oblique radiographs right ankle with ankle Talotibial fusion that appears solid. There is only one retained screw anterior to posterior distal tibia. No lucency about the lateral malleolus no sign of perosteal change.     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   Past Medical History:  Diagnosis Date   Anxiety    Panic attacks   Arthritis    Bipolar disorder (HCC) 01/21/13   Chronic pain    COPD (chronic obstructive pulmonary disease) (HCC)    Depression    Drug-seeking behavior    Dyspnea    with much activity   GERD (gastroesophageal reflux disease)    Head injury with loss of consciousness (HCC)    Leukocytoclastic vasculitis (HCC)    Lupus (HCC)    Lupus (HCC)    MRSA (methicillin resistant Staphylococcus aureus)    Tobacco abuse     Family History  Problem Relation Age of Onset   Stroke Mother     Past Surgical History:  Procedure Laterality Date   ANKLE ARTHROSCOPY Left 06/25/2018   Procedure: LEFT ANKLE ARTHROSCOPY;  Surgeon: Nadara Mustarduda, Marcus V, MD;  [facility];  Service: Orthopedics;   Laterality: Left;   ANKLE FUSION Left 05/15/2019   Procedure: LEFT ANKLE FUSION;  Surgeon: Nadara Mustarduda, Marcus V, MD;  [facility];  Service: Orthopedics;  Laterality: Left;   ANKLE SURGERY Left    APPLICATION OF WOUND VAC Left 05/15/2019   Procedure: Application Of Wound Vac;  Surgeon: Nadara Mustarduda, Marcus V, MD;  [facility];  Service: Orthopedics;  Laterality: Left;   CHOLECYSTECTOMY     HARDWARE REMOVAL  09/25/2012   Procedure: HARDWARE REMOVAL;  Surgeon: Kerrin ChampagneJames E Rosamond Andress, MD;  [facility];  Service: Orthopedics;  Laterality: Left;  Incision, drainage and debridement of left lateral ankle, removal of plate and screws   TUBAL LIGATION     Social History   Occupational History   Not on file  Tobacco Use   Smoking status: Current Every Day Smoker    Packs/day: 0.50    Years: 37.00    Pack years: 18.50    Types: Cigarettes   Smokeless tobacco: Never Used  Substance and Sexual Activity   Alcohol use: No   Drug use: Yes    Types: Marijuana    Comment: 1-2 times a day "none 06/24/18"   Sexual activity: Yes

## 2019-09-21 ENCOUNTER — Ambulatory Visit: Payer: Medicaid Other | Admitting: Specialist

## 2019-09-23 ENCOUNTER — Other Ambulatory Visit: Payer: Self-pay | Admitting: Family

## 2019-09-23 DIAGNOSIS — Z981 Arthrodesis status: Secondary | ICD-10-CM

## 2019-09-23 NOTE — Telephone Encounter (Signed)
do you want to refill?

## 2019-09-24 LAB — ANAEROBIC AND AEROBIC CULTURE
AER RESULT:: NO GROWTH
MICRO NUMBER:: 1049789
MICRO NUMBER:: 1049790
SPECIMEN QUALITY:: ADEQUATE
SPECIMEN QUALITY:: ADEQUATE

## 2019-09-30 ENCOUNTER — Other Ambulatory Visit: Payer: Self-pay | Admitting: Family

## 2019-09-30 DIAGNOSIS — Z981 Arthrodesis status: Secondary | ICD-10-CM

## 2019-09-30 NOTE — Telephone Encounter (Signed)
Patient is scheduled with Erin 10/06/2019 at 9:30am

## 2019-09-30 NOTE — Telephone Encounter (Signed)
Pt was to follow up in the office last week. Was treated for cellulitis and started on ABX. Can you please call and make an appt for this pt? Will discuss refill at that time.

## 2019-10-06 ENCOUNTER — Other Ambulatory Visit: Payer: Self-pay

## 2019-10-06 ENCOUNTER — Ambulatory Visit (INDEPENDENT_AMBULATORY_CARE_PROVIDER_SITE_OTHER): Payer: Medicaid Other | Admitting: Family

## 2019-10-06 ENCOUNTER — Encounter: Payer: Self-pay | Admitting: Family

## 2019-10-06 ENCOUNTER — Ambulatory Visit (INDEPENDENT_AMBULATORY_CARE_PROVIDER_SITE_OTHER): Payer: Medicaid Other

## 2019-10-06 VITALS — Ht 63.0 in | Wt 200.0 lb

## 2019-10-06 DIAGNOSIS — M79672 Pain in left foot: Secondary | ICD-10-CM | POA: Diagnosis not present

## 2019-10-06 DIAGNOSIS — Z981 Arthrodesis status: Secondary | ICD-10-CM

## 2019-10-06 DIAGNOSIS — M722 Plantar fascial fibromatosis: Secondary | ICD-10-CM | POA: Diagnosis not present

## 2019-10-06 MED ORDER — OXYCODONE-ACETAMINOPHEN 5-325 MG PO TABS: 1.0000 | ORAL_TABLET | Freq: Two times a day (BID) | ORAL | 0 refills | Status: DC | PRN

## 2019-10-06 NOTE — Progress Notes (Signed)
Office Visit Note   Patient: [name]           Date of Birth: 1971/10/30           MRN: [id number] Visit Date: 10/06/2019              Requested by: No referring provider defined for this encounter. PCP: Patient, No Pcp Per  Chief Complaint  Patient presents with  . Left Ankle - Follow-up    05/15/19 left ankle fusion       HPI: Patient is a 48 year old woman who is 4-1/2 months status post left ankle fusion.  Patient complains of pain around the posterior aspect of her heel and also pain on the plantar aspect of the plantar fascia.  Patient is currently ambulating in flip-flops.  Patient states that she is still smoking but is trying to decrease her amount.  Assessment & Plan: Visit Diagnoses:  1. S/P ankle fusion   2. Pain in left foot     Plan: Subtalar joint was injected discussed that if she gets good relief with a subtalar injection we could proceed with a subtalar fusion.  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  Again reinforced the importance of smoking cessation.  Patient was previously treated at a pain clinic with Suboxone.  Discussed that we would not be able to provide her long-term pain management and recommended she recontact her pain clinic for follow-up.   Follow-Up Instructions: No follow-ups on file.   Ortho Exam  Patient is alert, oriented, no adenopathy, well-dressed, normal affect, normal respiratory effort. On examination patient with ambulation has a limp.  She does have varus alignment of the left calcaneus with ambulation.  She is tender to palpation posteriorly around the subtalar joint and also painful to palpation over the sinus Tarsi.  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  This is also tender to palpation there is no skin color or temperature changes.  Her surgical incision is well-healed no signs of infection.  Recently patient did have an  ulcer over the lateral malleolus on the right ankle this was debrided by Dr. Otelia Sergeant and also placed on 2 weeks of doxycycline.  Examination at this time the right ankle shows no cellulitis no swelling no signs of infection.  Imaging: No results found. No images are attached to the encounter.  Labs: Lab Results  Component Value Date   HGBA1C 5.1 10/05/2013   HGBA1C 5.5 09/25/2012   HGBA1C 5.5 09/13/2012   ESRSEDRATE 18 01/14/2013   ESRSEDRATE 28 (H) 11/24/2012   ESRSEDRATE 18 10/21/2012   CRP 7.1 (H) 01/14/2013   CRP 1.6 (H) 11/24/2012   CRP 0.6 (H) 10/21/2012   REPTSTATUS 06/29/2017 FINAL 06/27/2017   GRAMSTAIN  09/25/2012    MODERATE WBC PRESENT,BOTH PMN AND MONONUCLEAR NO ORGANISMS SEEN Performed at [facility]   GRAMSTAIN  09/25/2012    MODERATE WBC PRESENT,BOTH PMN AND MONONUCLEAR NO ORGANISMS SEEN Performed at [facility]   GRAMSTAIN  09/25/2012    MODERATE WBC PRESENT,BOTH PMN AND MONONUCLEAR NO ORGANISMS SEEN   CULT NO GROUP A STREP (S.PYOGENES) ISOLATED 06/27/2017   LABORGA ESCHERICHIA COLI 03/25/2013     Lab Results  Component Value Date   ALBUMIN 3.3 (L) 06/25/2018   ALBUMIN 3.7 09/03/2013   ALBUMIN 3.0 (L) 03/25/2013    Lab Results  Component Value Date   MG 1.7 07/16/2012  MG 2.4 01/09/2011   No results found for: VD25OH  No results found for: PREALBUMIN CBC EXTENDED Latest Ref Rng & Units 05/15/2019 06/25/2018 06/27/2017  WBC 4.0 - 10.5 K/uL 8.6 - -  RBC 3.87 - 5.11 MIL/uL 5.18(H) - -  HGB 12.0 - 15.0 g/dL 14.6 13.7 13.6  HCT 36.0 - 46.0 % 45.4 - 40.0  PLT 150 - 400 K/uL 249 - -  NEUTROABS 1.7 - 7.7 K/uL - - -  LYMPHSABS 0.7 - 4.0 K/uL - - -     Body mass index is 35.43 kg/m.  Orders:  Orders Placed This Encounter  Procedures  . XR Ankle Complete Left   No orders of the defined types were placed in this encounter.    Procedures: Small Joint Inj: L subtalar on 10/06/2019 9:19 AM Indications: pain and diagnostic  evaluation Details: 22 G needle  Spinal Needle: No  Medications: 40 mg methylPREDNISolone acetate 40 MG/ML; 1 mL lidocaine 1 % Outcome: tolerated well, no immediate complications Procedure, treatment alternatives, risks and benefits explained, specific risks discussed. Consent was given by the patient. Immediately prior to procedure a time out was called to verify the correct patient, procedure, equipment, support staff and site/side marked as required. Patient was prepped and draped in the usual sterile fashion.      Clinical Data: No additional findings.  ROS:  All other systems negative, except as noted in the HPI. Review of Systems  Objective: Vital Signs: Ht 5\' 3"  (1.6 m)   Wt 200 lb (90.7 kg)   BMI 35.43 kg/m   Specialty Comments:  No specialty comments available.  PMFS History: Patient Active Problem List   Diagnosis Date Noted  . Status post ankle fusion 05/15/2019  . Post-traumatic arthritis of ankle, left   . Traumatic arthritis of left ankle   . Rash and nonspecific skin eruption 10/06/2013  . URI, acute 10/06/2013  . Obesity, unspecified 10/06/2013  . Tobacco use disorder 10/05/2013  . Hoarseness, chronic 09/05/2013  . 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 Bipolar disorder ([place])   . Polysubstance abuse ([place]) 09/13/2012  . Anxiety and depression 07/19/2012  . 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 Anxiety    Panic attacks  . Arthritis   . Bipolar disorder ([place]) 01/21/13  . Chronic pain   . COPD (chronic obstructive pulmonary disease) ([place])   . Depression   . Drug-seeking behavior   . Dyspnea    with much activity  . GERD (gastroesophageal reflux disease)   . Head injury with loss of consciousness ([place])   . Leukocytoclastic vasculitis ([place])   . Lupus ([place])   . Lupus ([place])   . MRSA (methicillin resistant Staphylococcus aureus)   . Tobacco abuse     Family History  Problem Relation Age of  Onset  . Stroke Mother     Past Surgical History:  Procedure Laterality Date  . ANKLE ARTHROSCOPY Left 06/25/2018   Procedure: LEFT ANKLE ARTHROSCOPY;  Surgeon: Newt Minion, MD;  Location: [place];  Service: Orthopedics;  Laterality: Left;  . ANKLE FUSION Left 05/15/2019   Procedure: LEFT ANKLE FUSION;  Surgeon: Newt Minion, MD;  Location: [place];  Service: Orthopedics;  Laterality: Left;  . ANKLE SURGERY Left   . APPLICATION OF WOUND VAC Left 05/15/2019   Procedure: Application Of Wound Vac;  Surgeon: Newt Minion, MD;  Location: [place];  Service: Orthopedics;  Laterality: Left;  . CHOLECYSTECTOMY    .  HARDWARE REMOVAL  09/25/2012   Procedure: HARDWARE REMOVAL;  Surgeon: Kerrin ChampagneJames E Nitka, MD;  [facility];  Service: Orthopedics;  Laterality: Left;  Incision, drainage and debridement of left lateral ankle, removal of plate and screws  . TUBAL LIGATION     Social History   Occupational History  . Not on file  Tobacco Use  . Smoking status: Current Every Day Smoker    Packs/day: 0.50    Years: 37.00    Pack years: 18.50    Types: Cigarettes  . Smokeless tobacco: Never Used  Substance and Sexual Activity  . Alcohol use: No  . Drug use: Yes    Types: Marijuana    Comment: 1-2 times a day "none 06/24/18"  . Sexual activity: Yes

## 2019-10-09 MED ORDER — LIDOCAINE HCL 1 % IJ SOLN
1.0000 mL | INTRAMUSCULAR | Status: AC | PRN
  Administered 2019-10-06: 1 mL

## 2019-10-09 MED ORDER — METHYLPREDNISOLONE ACETATE 40 MG/ML IJ SUSP
40.0000 mg | INTRAMUSCULAR | Status: AC | PRN
  Administered 2019-10-06: 09:00:00 40 mg via INTRA_ARTICULAR

## 2019-10-27 ENCOUNTER — Ambulatory Visit (INDEPENDENT_AMBULATORY_CARE_PROVIDER_SITE_OTHER): Payer: Medicaid Other | Admitting: Orthopedic Surgery

## 2019-10-27 ENCOUNTER — Other Ambulatory Visit: Payer: Self-pay

## 2019-10-27 ENCOUNTER — Encounter: Payer: Self-pay | Admitting: Orthopedic Surgery

## 2019-10-27 ENCOUNTER — Ambulatory Visit (INDEPENDENT_AMBULATORY_CARE_PROVIDER_SITE_OTHER): Payer: Medicaid Other

## 2019-10-27 VITALS — Ht 63.0 in | Wt 200.0 lb

## 2019-10-27 DIAGNOSIS — M25571 Pain in right ankle and joints of right foot: Secondary | ICD-10-CM

## 2019-10-27 DIAGNOSIS — M86271 Subacute osteomyelitis, right ankle and foot: Secondary | ICD-10-CM

## 2019-10-27 DIAGNOSIS — Z981 Arthrodesis status: Secondary | ICD-10-CM

## 2019-10-27 MED ORDER — OXYCODONE-ACETAMINOPHEN 5-325 MG PO TABS: 1.0000 | ORAL_TABLET | Freq: Two times a day (BID) | ORAL | 0 refills | Status: DC | PRN

## 2019-10-27 MED ORDER — LEVOFLOXACIN 750 MG PO TABS: 750.0000 mg | ORAL_TABLET | Freq: Every day | ORAL | 0 refills | Status: DC

## 2019-10-27 NOTE — Progress Notes (Signed)
Office Visit Note   Patient: [name]           Date of Birth: 1971/05/26           MRN: [id number] Visit Date: 10/27/2019              Requested by: No referring provider defined for this encounter. PCP: Patient, No Pcp Per  Chief Complaint  Patient presents with  . Left Ankle - Follow-up    05/15/19 left ankle fusion   . Right Ankle - Pain      HPI: Patient is a 48 year old woman who was seen for evaluation of both lower extremities.  She states she has had acute ulceration cellulitis and pain over the fibula right ankle.  She states she initially underwent an ankle fusion has had infection in the ankle from previous trauma.  She states she has completed a course of doxycycline with worsening of the cellulitis.  Patient states that the subtalar injection on the left caused increased pain.  Assessment & Plan: Visit Diagnoses:  1. Pain in right ankle and joints of right foot   2. Subacute osteomyelitis, right ankle and foot ([place])   3. S/P ankle fusion   4. Subacute osteomyelitis of right ankle (HCC)     Plan: Discussed with the patient her left ankle symptoms are most likely due to the subtalar arthrosis.  Discussed that we could proceed with a arthroscopic posterior subtalar arthrodesis once we have resolved the right ankle infection issue.  We will go ahead and call in a prescription for Levaquin today to start 750 mg daily for the right ankle we will set her up for an MRI scan and will follow up next week to evaluate if there is any resolution of the cellulitis.  Anticipate patient will need to proceed with a resection of the distal fibula on the right.  Follow-Up Instructions: Return in about 1 week (around 11/03/2019).   Ortho Exam  Patient is alert, oriented, no adenopathy, well-dressed, normal affect, normal respiratory effort. Examination patient has good pulses on both ankles she is tender to palpation over the sinus Tarsi as well as over the posterior facet of the  subtalar joint on the left this reproduces her symptoms radiographs shows a stable ankle fusion.  Examination of the right ankle she has swelling cellulitis ulceration over the distal fibula.  Palpation is extremely tender.  Patient states the acute changes have been going on for a week and a half.  Initial ulcer debridement by Dr. Louanne Skye was about 5 weeks ago.  Imaging: Xr Ankle Complete Right  Result Date: 10/27/2019 Three-view radiographs of the right ankle shows no definite destructive bony changes of the fibula.  The fusion is stable.  No images are attached to the encounter.  Labs: Lab Results  Component Value Date   HGBA1C 5.1 10/05/2013   HGBA1C 5.5 09/25/2012   HGBA1C 5.5 09/13/2012   ESRSEDRATE 18 01/14/2013   ESRSEDRATE 28 (H) 11/24/2012   ESRSEDRATE 18 10/21/2012   CRP 7.1 (H) 01/14/2013   CRP 1.6 (H) 11/24/2012   CRP 0.6 (H) 10/21/2012   REPTSTATUS 06/29/2017 FINAL 06/27/2017   GRAMSTAIN  09/25/2012    MODERATE WBC PRESENT,BOTH PMN AND MONONUCLEAR NO ORGANISMS SEEN Performed at [facility]  09/25/2012    MODERATE WBC PRESENT,BOTH PMN AND MONONUCLEAR NO ORGANISMS SEEN Performed at [place]  09/25/2012    MODERATE WBC PRESENT,BOTH PMN AND MONONUCLEAR NO  ORGANISMS SEEN   CULT NO GROUP A STREP (S.PYOGENES) ISOLATED 06/27/2017   LABORGA ESCHERICHIA COLI 03/25/2013     Lab Results  Component Value Date   ALBUMIN 3.3 (L) 06/25/2018   ALBUMIN 3.7 09/03/2013   ALBUMIN 3.0 (L) 03/25/2013    Lab Results  Component Value Date   MG 1.7 07/16/2012   MG 2.4 01/09/2011   No results found for: VD25OH  No results found for: PREALBUMIN CBC EXTENDED Latest Ref Rng & Units 05/15/2019 06/25/2018 06/27/2017  WBC 4.0 - 10.5 K/uL 8.6 - -  RBC 3.87 - 5.11 MIL/uL 5.18(H) - -  HGB 12.0 - 15.0 g/dL 16.114.6 09.613.7 04.513.6  HCT 40.936.0 - 46.0 % 45.4 - 40.0  PLT 150 - 400 K/uL 249 - -  NEUTROABS 1.7 - 7.7 K/uL - - -  LYMPHSABS 0.7 - 4.0 K/uL - - -      Body mass index is 35.43 kg/m.  Orders:  Orders Placed This Encounter  Procedures  . XR Ankle Complete Right   Meds ordered this encounter  Medications  . levofloxacin (LEVAQUIN) 750 MG tablet    Sig: Take 1 tablet (750 mg total) by mouth daily.    Dispense:  20 tablet    Refill:  0  . oxyCODONE-acetaminophen (PERCOCET/ROXICET) 5-325 MG tablet    Sig: Take 1 tablet by mouth 2 (two) times daily as needed for severe pain.    Dispense:  14 tablet    Refill:  0     Procedures: No procedures performed  Clinical Data: No additional findings.  ROS:  All other systems negative, except as noted in the HPI. Review of Systems  Objective: Vital Signs: Ht 5\' 3"  (1.6 m)   Wt 200 lb (90.7 kg)   BMI 35.43 kg/m   Specialty Comments:  No specialty comments available.  PMFS History: Patient Active Problem List   Diagnosis Date Noted  . Status post ankle fusion 05/15/2019  . Post-traumatic arthritis of ankle, left   . Traumatic arthritis of left ankle   . Rash and nonspecific skin eruption 10/06/2013  . URI, acute 10/06/2013  . Obesity, unspecified 10/06/2013  . Tobacco use disorder 10/05/2013  . Hoarseness, chronic 09/05/2013  . 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 Bipolar disorder (HCC)   . Polysubstance abuse (HCC) 09/13/2012  . Anxiety and depression 07/19/2012  . 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 Anxiety    Panic attacks  . Arthritis   . Bipolar disorder (HCC) 01/21/13  . Chronic pain   . COPD (chronic obstructive pulmonary disease) (HCC)   . Depression   . Drug-seeking behavior   . Dyspnea    with much activity  . GERD (gastroesophageal reflux disease)   . Head injury with loss of consciousness (HCC)   . 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 Lupus (HCC)   . Lupus (HCC)   . MRSA (methicillin resistant Staphylococcus aureus)   . Tobacco abuse     Family History  Problem Relation Age of Onset   . Stroke Mother     Past Surgical History:  Procedure Laterality Date  . ANKLE ARTHROSCOPY Left 06/25/2018   Procedure: LEFT ANKLE ARTHROSCOPY;  Surgeon: Nadara Mustarduda, Damarious Holtsclaw V, MD;  [facility];  Service: Orthopedics;  Laterality: Left;  . ANKLE FUSION Left 05/15/2019   Procedure: LEFT ANKLE FUSION;  Surgeon: Nadara Mustarduda, Temika Sutphin V, MD;  [facility];  Service: Orthopedics;  Laterality: Left;  . ANKLE  SURGERY Left   . APPLICATION OF WOUND VAC Left 05/15/2019   Procedure: Application Of Wound Vac;  Surgeon: Nadara Mustard, MD;  Location: [facility];  Service: Orthopedics;  Laterality: Left;  . CHOLECYSTECTOMY    . HARDWARE REMOVAL  09/25/2012   Procedure: HARDWARE REMOVAL;  Surgeon: Kerrin Champagne, MD;  Location: [facility];  Service: Orthopedics;  Laterality: Left;  Incision, drainage and debridement of left lateral ankle, removal of plate and screws  . TUBAL LIGATION     Social History   Occupational History  . Not on file  Tobacco Use  . Smoking status: Current Every Day Smoker    Packs/day: 0.50    Years: 37.00    Pack years: 18.50    Types: Cigarettes  . Smokeless tobacco: Never Used  Substance and Sexual Activity  . Alcohol use: No  . Drug use: Yes    Types: Marijuana    Comment: 1-2 times a day "none 06/24/18"  . Sexual activity: Yes

## 2019-11-03 ENCOUNTER — Ambulatory Visit (INDEPENDENT_AMBULATORY_CARE_PROVIDER_SITE_OTHER): Payer: Medicaid Other | Admitting: Orthopedic Surgery

## 2019-11-03 ENCOUNTER — Encounter: Payer: Self-pay | Admitting: Orthopedic Surgery

## 2019-11-03 ENCOUNTER — Other Ambulatory Visit: Payer: Self-pay

## 2019-11-03 VITALS — Ht 63.0 in | Wt 200.0 lb

## 2019-11-03 DIAGNOSIS — M86271 Subacute osteomyelitis, right ankle and foot: Secondary | ICD-10-CM

## 2019-11-03 DIAGNOSIS — Z981 Arthrodesis status: Secondary | ICD-10-CM

## 2019-11-03 MED ORDER — OXYCODONE-ACETAMINOPHEN 5-325 MG PO TABS: 1.0000 | ORAL_TABLET | Freq: Four times a day (QID) | ORAL | 0 refills | Status: DC | PRN

## 2019-11-04 ENCOUNTER — Encounter: Payer: Self-pay | Admitting: Orthopedic Surgery

## 2019-11-04 NOTE — Progress Notes (Signed)
Office Visit Note   Patient: [name]           Date of Birth: 1971/08/01           MRN: [id number] Visit Date: 11/03/2019              Requested by: No referring provider defined for this encounter. PCP: Patient, No Pcp Per  Chief Complaint  Patient presents with  . Right Ankle - Follow-up      HPI: Patient is a 48 year old woman who presents in follow-up with cellulitis over the lateral malleolus.  She is status post left ankle fusion with the infection of the right ankle.  She has been on Levaquin.  An MRI scan is scheduled of the right ankle on January 6.  Patient states the redness has decreased she denies any drainage at this time she states the swelling has decreased.  Assessment & Plan: Visit Diagnoses:  1. Subacute osteomyelitis, right ankle and foot (HCC)   2. S/P ankle fusion     Plan: We will follow-up after her MRI scan continue the Levaquin.  Follow-Up Instructions: Return in about 4 weeks (around 12/01/2019).   Ortho Exam  Patient is alert, oriented, no adenopathy, well-dressed, normal affect, normal respiratory effort. Patient has a antalgic gait she is currently wearing flip-flops.  The skin is now wrinkling around the right ankle with decreased swelling there is decreased cellulitis there is less tenderness to palpation there is no drainage.  Imaging: No results found. No images are attached to the encounter.  Labs: Lab Results  Component Value Date   HGBA1C 5.1 10/05/2013   HGBA1C 5.5 09/25/2012   HGBA1C 5.5 09/13/2012   ESRSEDRATE 18 01/14/2013   ESRSEDRATE 28 (H) 11/24/2012   ESRSEDRATE 18 10/21/2012   CRP 7.1 (H) 01/14/2013   CRP 1.6 (H) 11/24/2012   CRP 0.6 (H) 10/21/2012   REPTSTATUS 06/29/2017 FINAL 06/27/2017   GRAMSTAIN  09/25/2012    MODERATE WBC PRESENT,BOTH PMN AND MONONUCLEAR NO ORGANISMS SEEN Performed at [facility]   GRAMSTAIN  09/25/2012    MODERATE WBC PRESENT,BOTH PMN AND MONONUCLEAR NO ORGANISMS  SEEN Performed at [facility]   GRAMSTAIN  09/25/2012    MODERATE WBC PRESENT,BOTH PMN AND MONONUCLEAR NO ORGANISMS SEEN   CULT NO GROUP A STREP (S.PYOGENES) ISOLATED 06/27/2017   LABORGA ESCHERICHIA COLI 03/25/2013     Lab Results  Component Value Date   ALBUMIN 3.3 (L) 06/25/2018   ALBUMIN 3.7 09/03/2013   ALBUMIN 3.0 (L) 03/25/2013    Lab Results  Component Value Date   MG 1.7 07/16/2012   MG 2.4 01/09/2011   No results found for: VD25OH  No results found for: PREALBUMIN CBC EXTENDED Latest Ref Rng & Units 05/15/2019 06/25/2018 06/27/2017  WBC 4.0 - 10.5 K/uL 8.6 - -  RBC 3.87 - 5.11 MIL/uL 5.18(H) - -  HGB 12.0 - 15.0 g/dL 84.1 32.4 40.1  HCT 02.7 - 46.0 % 45.4 - 40.0  PLT 150 - 400 K/uL 249 - -  NEUTROABS 1.7 - 7.7 K/uL - - -  LYMPHSABS 0.7 - 4.0 K/uL - - -     Body mass index is 35.43 kg/m.  Orders:  No orders of the defined types were placed in this encounter.  Meds ordered this encounter  Medications  . oxyCODONE-acetaminophen (PERCOCET/ROXICET) 5-325 MG tablet    Sig: Take 1 tablet by mouth every 6 (six) hours as needed for severe pain.    Dispense:  20 tablet    Refill:  0     Procedures: No procedures performed  Clinical Data: No additional findings.  ROS:  All other systems negative, except as noted in the HPI. Review of Systems  Objective: Vital Signs: Ht 5\' 3"  (1.6 m)   Wt 200 lb (90.7 kg)   BMI 35.43 kg/m   Specialty Comments:  No specialty comments available.  PMFS History: Patient Active Problem List   Diagnosis Date Noted  . Status post ankle fusion 05/15/2019  . Post-traumatic arthritis of ankle, left   . Traumatic arthritis of left ankle   . Rash and nonspecific skin eruption 10/06/2013  . URI, acute 10/06/2013  . Obesity, unspecified 10/06/2013  . Tobacco use disorder 10/05/2013  . Hoarseness, chronic 09/05/2013  . 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 Bipolar disorder ([place])   . Polysubstance abuse ([place])  09/13/2012  . Anxiety and depression 07/19/2012  . 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 Anxiety    Panic attacks  . Arthritis   . Bipolar disorder ([place]) 01/21/13  . Chronic pain   . COPD (chronic obstructive pulmonary disease) ([place])   . Depression   . Drug-seeking behavior   . Dyspnea    with much activity  . GERD (gastroesophageal reflux disease)   . Head injury with loss of consciousness ([place])   . Leukocytoclastic vasculitis ([place])   . Lupus ([place])   . Lupus ([place])   . MRSA (methicillin resistant Staphylococcus aureus)   . Tobacco abuse     Family History  Problem Relation Age of Onset  . Stroke Mother     Past Surgical History:  Procedure Laterality Date  . ANKLE ARTHROSCOPY Left 06/25/2018   Procedure: LEFT ANKLE ARTHROSCOPY;  Surgeon: Newt Minion, MD;  Location: [place];  Service: Orthopedics;  Laterality: Left;  . ANKLE FUSION Left 05/15/2019   Procedure: LEFT ANKLE FUSION;  Surgeon: Newt Minion, MD;  Location: [place];  Service: Orthopedics;  Laterality: Left;  . ANKLE SURGERY Left   . APPLICATION OF WOUND VAC Left 05/15/2019   Procedure: Application Of Wound Vac;  Surgeon: Newt Minion, MD;  Location: [place];  Service: Orthopedics;  Laterality: Left;  . CHOLECYSTECTOMY    . HARDWARE REMOVAL  09/25/2012   Procedure: HARDWARE REMOVAL;  Surgeon: Jessy Oto, MD;  Location: [place];  Service: Orthopedics;  Laterality: Left;  Incision, drainage and debridement of left lateral ankle, removal of plate and screws  . TUBAL LIGATION     Social History   Occupational History  . Not on file  Tobacco Use  . Smoking status: Current Every Day Smoker    Packs/day: 0.50    Years: 37.00    Pack years: 18.50    Types: Cigarettes  . Smokeless tobacco: Never Used  Substance and Sexual Activity  . Alcohol use: No  . Drug use: Yes    Types: Marijuana    Comment: 1-2 times a day "none 06/24/18"  . Sexual activity: Yes

## 2019-11-25 ENCOUNTER — Ambulatory Visit
Admission: RE | Admit: 2019-11-25 | Discharge: 2019-11-25 | Disposition: A | Discharge status: Home / Self Care | Payer: Medicaid Other | Source: Ambulatory Visit | Attending: Orthopedic Surgery | Admitting: Orthopedic Surgery

## 2019-11-25 DIAGNOSIS — M86271 Subacute osteomyelitis, right ankle and foot: Secondary | ICD-10-CM

## 2019-11-26 ENCOUNTER — Encounter: Payer: Self-pay | Admitting: Orthopedic Surgery

## 2019-11-26 ENCOUNTER — Other Ambulatory Visit: Payer: Self-pay

## 2019-11-26 ENCOUNTER — Ambulatory Visit (INDEPENDENT_AMBULATORY_CARE_PROVIDER_SITE_OTHER): Payer: Medicaid Other | Admitting: Orthopedic Surgery

## 2019-11-26 DIAGNOSIS — M25571 Pain in right ankle and joints of right foot: Secondary | ICD-10-CM

## 2019-11-27 ENCOUNTER — Telehealth: Payer: Self-pay | Admitting: Orthopedic Surgery

## 2019-11-27 DIAGNOSIS — Z981 Arthrodesis status: Secondary | ICD-10-CM

## 2019-11-27 MED ORDER — OXYCODONE-ACETAMINOPHEN 5-325 MG PO TABS: 1.0000 | ORAL_TABLET | Freq: Three times a day (TID) | ORAL | 0 refills | Status: DC | PRN

## 2019-11-27 NOTE — Addendum Note (Signed)
Addended by: Barnie Del R on: 11/27/2019 10:28 AM   Modules accepted: Orders

## 2019-11-27 NOTE — Telephone Encounter (Signed)
Patient called. She would like Oxycodone called in to her pharmacy. Her call back number is [phone]

## 2019-11-27 NOTE — Telephone Encounter (Signed)
Pt is s/p an ankle fusion 05/15/19 and is asking for a refill on her Oxycodone 5/325. last refill was 11/03/19 #20 and 10/27/19 #14

## 2019-11-30 ENCOUNTER — Encounter: Payer: Self-pay | Admitting: Orthopedic Surgery

## 2019-11-30 NOTE — Progress Notes (Signed)
Office Visit Note   Patient: [name]           Date of Birth: Feb 14, 1971           MRN: [id number] Visit Date: 11/26/2019              Requested by: No referring provider defined for this encounter. PCP: [facility], [facility]  Chief Complaint  Patient presents with  . Right Ankle - Pain, Follow-up      HPI: Patient is a 49 year old woman who presents in follow-up for the right and left ankle.  Patient complains of subtalar pain on the left and ulceration of the lateral malleolus on the right..  She states she has tried a fracture boot on the left without relief and is now status post MRI scan of the right ankle to evaluate the ulcer with the possibility of osteomyelitis.  Assessment & Plan: Visit Diagnoses:  1. Pain in right ankle and joints of right foot     Plan: Due to failure of conservative care and pain with activities of daily living patient states she would like to proceed with surgical intervention for the left subtalar joint.  We will plan for posterior arthroscopic subtalar arthrodesis on the left plan for outpatient surgery at [facility] day surgery on the left.  She is given a prescription for Percocet.  Discussed the right ankle shows no evidence of osteomyelitis and will follow this conservatively she will complete her course of Levaquin  Follow-Up Instructions: Return in about 2 weeks (around 12/10/2019) for Follow-up 1 to 2 weeks postoperatively.Gaylord Shih Exam  Patient is alert, oriented, no adenopathy, well-dressed, normal affect, normal respiratory effort. Examination patient has a good pulse she is tender to palpation of the sinus Tarsi attempted subtalar motion is painful on the left.  Review of the MRI scan right ankle shows no evidence of osteomyelitis or soft tissue abscess.  There is a superficial ulcer over the lateral malleolus.  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  Imaging: No results found. No images are attached to the  encounter.  Labs: Lab Results  Component Value Date   HGBA1C 5.1 10/05/2013   HGBA1C 5.5 09/25/2012   HGBA1C 5.5 09/13/2012   ESRSEDRATE 18 01/14/2013   ESRSEDRATE 28 (H) 11/24/2012   ESRSEDRATE 18 10/21/2012   CRP 7.1 (H) 01/14/2013   CRP 1.6 (H) 11/24/2012   CRP 0.6 (H) 10/21/2012   REPTSTATUS 06/29/2017 FINAL 06/27/2017   GRAMSTAIN  09/25/2012    MODERATE WBC PRESENT,BOTH PMN AND MONONUCLEAR NO ORGANISMS SEEN Performed at [facility]   GRAMSTAIN  09/25/2012    MODERATE WBC PRESENT,BOTH PMN AND MONONUCLEAR NO ORGANISMS SEEN Performed at [facility]   GRAMSTAIN  09/25/2012    MODERATE WBC PRESENT,BOTH PMN AND MONONUCLEAR NO ORGANISMS SEEN   CULT NO GROUP A STREP (S.PYOGENES) ISOLATED 06/27/2017   LABORGA ESCHERICHIA COLI 03/25/2013     Lab Results  Component Value Date   ALBUMIN 3.3 (L) 06/25/2018   ALBUMIN 3.7 09/03/2013   ALBUMIN 3.0 (L) 03/25/2013    Lab Results  Component Value Date   MG 1.7 07/16/2012   MG 2.4 01/09/2011   No results found for: VD25OH  No results found for: PREALBUMIN CBC EXTENDED Latest Ref Rng & Units 05/15/2019 06/25/2018 06/27/2017  WBC 4.0 - 10.5 K/uL 8.6 - -  RBC 3.87 - 5.11 MIL/uL 5.18(H) - -  HGB 12.0 - 15.0 g/dL 25.3 66.4 40.3  HCT 47.4 -  46.0 % 45.4 - 40.0  PLT 150 - 400 K/uL 249 - -  NEUTROABS 1.7 - 7.7 K/uL - - -  LYMPHSABS 0.7 - 4.0 K/uL - - -     There is no height or weight on file to calculate BMI.  Orders:  No orders of the defined types were placed in this encounter.  No orders of the defined types were placed in this encounter.    Procedures: No procedures performed  Clinical Data: No additional findings.  ROS:  All other systems negative, except as noted in the HPI. Review of Systems  Objective: Vital Signs: There were no vitals taken for this visit.  Specialty Comments:  No specialty comments available.  PMFS History: Patient Active Problem List   Diagnosis Date Noted  . Status  post ankle fusion 05/15/2019  . Post-traumatic arthritis of ankle, left   . Traumatic arthritis of left ankle   . Rash and nonspecific skin eruption 10/06/2013  . URI, acute 10/06/2013  . Obesity, unspecified 10/06/2013  . Tobacco use disorder 10/05/2013  . Hoarseness, chronic 09/05/2013  . 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 Bipolar disorder ([place])   . Polysubstance abuse ([place]) 09/13/2012  . Anxiety and depression 07/19/2012  . 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 Anxiety    Panic attacks  . Arthritis   . Bipolar disorder ([place]) 01/21/13  . Chronic pain   . COPD (chronic obstructive pulmonary disease) ([place])   . Depression   . Drug-seeking behavior   . Dyspnea    with much activity  . GERD (gastroesophageal reflux disease)   . Head injury with loss of consciousness ([place])   . Leukocytoclastic vasculitis ([place])   . Lupus ([place])   . Lupus ([place])   . MRSA (methicillin resistant Staphylococcus aureus)   . Tobacco abuse     Family History  Problem Relation Age of Onset  . Stroke Mother     Past Surgical History:  Procedure Laterality Date  . ANKLE ARTHROSCOPY Left 06/25/2018   Procedure: LEFT ANKLE ARTHROSCOPY;  Surgeon: Newt Minion, MD;  Location: [place];  Service: Orthopedics;  Laterality: Left;  . ANKLE FUSION Left 05/15/2019   Procedure: LEFT ANKLE FUSION;  Surgeon: Newt Minion, MD;  Location: [place];  Service: Orthopedics;  Laterality: Left;  . ANKLE SURGERY Left   . APPLICATION OF WOUND VAC Left 05/15/2019   Procedure: Application Of Wound Vac;  Surgeon: Newt Minion, MD;  Location: [place];  Service: Orthopedics;  Laterality: Left;  . CHOLECYSTECTOMY    . HARDWARE REMOVAL  09/25/2012   Procedure: HARDWARE REMOVAL;  Surgeon: Jessy Oto, MD;  Location: [place];  Service: Orthopedics;  Laterality: Left;  Incision, drainage and debridement of left lateral ankle, removal of plate and screws  . TUBAL LIGATION     Social  History   Occupational History  . Not on file  Tobacco Use  . Smoking status: Current Every Day Smoker    Packs/day: 0.50    Years: 37.00    Pack years: 18.50    Types: Cigarettes  . Smokeless tobacco: Never Used  Substance and Sexual Activity  . Alcohol use: No  . Drug use: Yes    Types: Marijuana    Comment: 1-2 times a day "none 06/24/18"  . Sexual activity: Yes

## 2019-12-08 ENCOUNTER — Other Ambulatory Visit: Payer: Self-pay | Admitting: Family

## 2019-12-08 DIAGNOSIS — Z981 Arthrodesis status: Secondary | ICD-10-CM

## 2019-12-08 NOTE — Telephone Encounter (Signed)
Please advise, thank you.

## 2019-12-09 ENCOUNTER — Other Ambulatory Visit: Payer: Self-pay | Admitting: Physician Assistant

## 2019-12-10 ENCOUNTER — Telehealth: Payer: Self-pay | Admitting: Orthopedic Surgery

## 2019-12-10 NOTE — Telephone Encounter (Signed)
Patient called requesting refill on oxycodone medication. Pharmacy [facility]. Patient phone number is [phone].

## 2019-12-10 NOTE — Telephone Encounter (Signed)
S/p ankle fusion on 05/15/19. Pt is requesting a refill on her Oxycodone  5/325 last refill 11/27/19 # 20

## 2019-12-10 NOTE — Telephone Encounter (Signed)
Erin signed off yesterday, thanks

## 2019-12-11 ENCOUNTER — Other Ambulatory Visit: Payer: Self-pay

## 2019-12-11 ENCOUNTER — Encounter (HOSPITAL_BASED_OUTPATIENT_CLINIC_OR_DEPARTMENT_OTHER): Payer: Self-pay | Admitting: Orthopedic Surgery

## 2019-12-11 ENCOUNTER — Other Ambulatory Visit (HOSPITAL_COMMUNITY)
Admission: RE | Admit: 2019-12-11 | Discharge: 2019-12-11 | Disposition: A | Discharge status: Home / Self Care | Payer: Medicaid Other | Source: Ambulatory Visit | Attending: Orthopedic Surgery | Admitting: Orthopedic Surgery

## 2019-12-11 DIAGNOSIS — Z20822 Contact with and (suspected) exposure to covid-19: Secondary | ICD-10-CM | POA: Insufficient documentation

## 2019-12-11 DIAGNOSIS — Z01812 Encounter for preprocedural laboratory examination: Secondary | ICD-10-CM | POA: Diagnosis not present

## 2019-12-11 LAB — SARS CORONAVIRUS 2 (TAT 6-24 HRS): SARS Coronavirus 2: NEGATIVE

## 2019-12-15 ENCOUNTER — Ambulatory Visit (HOSPITAL_BASED_OUTPATIENT_CLINIC_OR_DEPARTMENT_OTHER)
Admission: RE | Admit: 2019-12-15 | Discharge: 2019-12-15 | Disposition: A | Discharge status: Home / Self Care | Payer: Medicaid Other | Attending: Orthopedic Surgery | Admitting: Orthopedic Surgery

## 2019-12-15 ENCOUNTER — Encounter (HOSPITAL_BASED_OUTPATIENT_CLINIC_OR_DEPARTMENT_OTHER)
Admission: RE | Disposition: A | Discharge status: Home / Self Care | Payer: Self-pay | Source: Home / Self Care | Attending: Orthopedic Surgery

## 2019-12-15 ENCOUNTER — Ambulatory Visit (HOSPITAL_BASED_OUTPATIENT_CLINIC_OR_DEPARTMENT_OTHER): Payer: Medicaid Other | Admitting: Anesthesiology

## 2019-12-15 ENCOUNTER — Encounter (HOSPITAL_BASED_OUTPATIENT_CLINIC_OR_DEPARTMENT_OTHER): Payer: Self-pay | Admitting: Orthopedic Surgery

## 2019-12-15 ENCOUNTER — Other Ambulatory Visit: Payer: Self-pay

## 2019-12-15 DIAGNOSIS — M329 Systemic lupus erythematosus, unspecified: Secondary | ICD-10-CM | POA: Insufficient documentation

## 2019-12-15 DIAGNOSIS — F17210 Nicotine dependence, cigarettes, uncomplicated: Secondary | ICD-10-CM | POA: Insufficient documentation

## 2019-12-15 DIAGNOSIS — E669 Obesity, unspecified: Secondary | ICD-10-CM | POA: Insufficient documentation

## 2019-12-15 DIAGNOSIS — Z8614 Personal history of Methicillin resistant Staphylococcus aureus infection: Secondary | ICD-10-CM | POA: Diagnosis not present

## 2019-12-15 DIAGNOSIS — R0600 Dyspnea, unspecified: Secondary | ICD-10-CM | POA: Diagnosis not present

## 2019-12-15 DIAGNOSIS — J449 Chronic obstructive pulmonary disease, unspecified: Secondary | ICD-10-CM | POA: Insufficient documentation

## 2019-12-15 DIAGNOSIS — F419 Anxiety disorder, unspecified: Secondary | ICD-10-CM | POA: Diagnosis not present

## 2019-12-15 DIAGNOSIS — K219 Gastro-esophageal reflux disease without esophagitis: Secondary | ICD-10-CM | POA: Insufficient documentation

## 2019-12-15 DIAGNOSIS — Z888 Allergy status to other drugs, medicaments and biological substances status: Secondary | ICD-10-CM | POA: Insufficient documentation

## 2019-12-15 DIAGNOSIS — M19072 Primary osteoarthritis, left ankle and foot: Secondary | ICD-10-CM | POA: Diagnosis not present

## 2019-12-15 DIAGNOSIS — Z7951 Long term (current) use of inhaled steroids: Secondary | ICD-10-CM | POA: Insufficient documentation

## 2019-12-15 DIAGNOSIS — Z9049 Acquired absence of other specified parts of digestive tract: Secondary | ICD-10-CM | POA: Diagnosis not present

## 2019-12-15 DIAGNOSIS — F319 Bipolar disorder, unspecified: Secondary | ICD-10-CM | POA: Insufficient documentation

## 2019-12-15 DIAGNOSIS — M19172 Post-traumatic osteoarthritis, left ankle and foot: Secondary | ICD-10-CM

## 2019-12-15 DIAGNOSIS — F410 Panic disorder [episodic paroxysmal anxiety] without agoraphobia: Secondary | ICD-10-CM | POA: Diagnosis not present

## 2019-12-15 DIAGNOSIS — Z6834 Body mass index (BMI) 34.0-34.9, adult: Secondary | ICD-10-CM | POA: Diagnosis not present

## 2019-12-15 DIAGNOSIS — Z981 Arthrodesis status: Secondary | ICD-10-CM | POA: Diagnosis not present

## 2019-12-15 DIAGNOSIS — G8929 Other chronic pain: Secondary | ICD-10-CM | POA: Insufficient documentation

## 2019-12-15 DIAGNOSIS — Z79899 Other long term (current) drug therapy: Secondary | ICD-10-CM | POA: Diagnosis not present

## 2019-12-15 DIAGNOSIS — M24072 Loose body in left ankle: Secondary | ICD-10-CM | POA: Diagnosis not present

## 2019-12-15 DIAGNOSIS — Z823 Family history of stroke: Secondary | ICD-10-CM | POA: Insufficient documentation

## 2019-12-15 HISTORY — PX: ANKLE ARTHROSCOPY WITH FUSION: SHX5581

## 2019-12-15 HISTORY — DX: Primary osteoarthritis, left ankle and foot: M19.072

## 2019-12-15 SURGERY — ANKLE ARTHROSCOPY WITH FUSION
Anesthesia: General | Site: Ankle | Laterality: Left

## 2019-12-15 MED ORDER — PROPOFOL 10 MG/ML IV BOLUS
INTRAVENOUS | Status: DC | PRN
  Administered 2019-12-15: 120 mg via INTRAVENOUS

## 2019-12-15 MED ORDER — LIDOCAINE 2% (20 MG/ML) 5 ML SYRINGE
INTRAMUSCULAR | Status: DC | PRN
  Administered 2019-12-15: 50 mg via INTRAVENOUS

## 2019-12-15 MED ORDER — PROMETHAZINE HCL 25 MG/ML IJ SOLN: 6.2500 mg | INTRAMUSCULAR | Status: DC | PRN

## 2019-12-15 MED ORDER — MIDAZOLAM HCL 2 MG/2ML IJ SOLN
INTRAMUSCULAR | Status: AC
  Filled 2019-12-15: qty 2

## 2019-12-15 MED ORDER — ROCURONIUM BROMIDE 100 MG/10ML IV SOLN
INTRAVENOUS | Status: DC | PRN
  Administered 2019-12-15: 50 mg via INTRAVENOUS

## 2019-12-15 MED ORDER — MIDAZOLAM HCL 2 MG/2ML IJ SOLN
1.0000 mg | INTRAMUSCULAR | Status: DC | PRN
  Administered 2019-12-15: 08:00:00 2 mg via INTRAVENOUS

## 2019-12-15 MED ORDER — CEFAZOLIN SODIUM-DEXTROSE 2-4 GM/100ML-% IV SOLN
INTRAVENOUS | Status: AC
  Filled 2019-12-15: qty 100

## 2019-12-15 MED ORDER — CHLORHEXIDINE GLUCONATE 4 % EX LIQD: 60.0000 mL | Freq: Once | CUTANEOUS | Status: DC

## 2019-12-15 MED ORDER — LACTATED RINGERS IV SOLN: INTRAVENOUS | Status: DC

## 2019-12-15 MED ORDER — FENTANYL CITRATE (PF) 100 MCG/2ML IJ SOLN
50.0000 ug | INTRAMUSCULAR | Status: AC | PRN
  Administered 2019-12-15: 50 ug via INTRAVENOUS
  Administered 2019-12-15: 08:00:00 100 ug via INTRAVENOUS
  Administered 2019-12-15: 50 ug via INTRAVENOUS

## 2019-12-15 MED ORDER — OXYCODONE-ACETAMINOPHEN 10-325 MG PO TABS: 1.0000 | ORAL_TABLET | ORAL | 0 refills | Status: DC | PRN

## 2019-12-15 MED ORDER — ESMOLOL HCL 100 MG/10ML IV SOLN
INTRAVENOUS | Status: DC | PRN
  Administered 2019-12-15: 20 mg via INTRAVENOUS

## 2019-12-15 MED ORDER — PROPOFOL 10 MG/ML IV BOLUS
INTRAVENOUS | Status: AC
  Filled 2019-12-15: qty 20

## 2019-12-15 MED ORDER — MEPERIDINE HCL 25 MG/ML IJ SOLN: 6.2500 mg | INTRAMUSCULAR | Status: DC | PRN

## 2019-12-15 MED ORDER — ONDANSETRON HCL 4 MG/2ML IJ SOLN
INTRAMUSCULAR | Status: DC | PRN
  Administered 2019-12-15: 4 mg via INTRAVENOUS

## 2019-12-15 MED ORDER — HYDROMORPHONE HCL 1 MG/ML IJ SOLN
0.2500 mg | INTRAMUSCULAR | Status: DC | PRN
  Administered 2019-12-15 (×3): 0.5 mg via INTRAVENOUS

## 2019-12-15 MED ORDER — HYDROMORPHONE HCL 1 MG/ML IJ SOLN
INTRAMUSCULAR | Status: AC
  Filled 2019-12-15: qty 0.5

## 2019-12-15 MED ORDER — FENTANYL CITRATE (PF) 100 MCG/2ML IJ SOLN
INTRAMUSCULAR | Status: AC
  Filled 2019-12-15: qty 2

## 2019-12-15 MED ORDER — MIDAZOLAM HCL 2 MG/2ML IJ SOLN: 0.5000 mg | Freq: Once | INTRAMUSCULAR | Status: DC | PRN

## 2019-12-15 MED ORDER — SUGAMMADEX SODIUM 200 MG/2ML IV SOLN
INTRAVENOUS | Status: DC | PRN
  Administered 2019-12-15: 300 mg via INTRAVENOUS

## 2019-12-15 MED ORDER — CEFAZOLIN SODIUM-DEXTROSE 2-4 GM/100ML-% IV SOLN
2.0000 g | INTRAVENOUS | Status: AC
  Administered 2019-12-15: 2 g via INTRAVENOUS

## 2019-12-15 MED ORDER — ROPIVACAINE HCL 7.5 MG/ML IJ SOLN
INTRAMUSCULAR | Status: DC | PRN
  Administered 2019-12-15: 20 mL via PERINEURAL

## 2019-12-15 MED ORDER — DEXAMETHASONE SODIUM PHOSPHATE 10 MG/ML IJ SOLN
INTRAMUSCULAR | Status: DC | PRN
  Administered 2019-12-15: 5 mg via INTRAVENOUS

## 2019-12-15 MED ORDER — BUPIVACAINE-EPINEPHRINE (PF) 0.5% -1:200000 IJ SOLN
INTRAMUSCULAR | Status: DC | PRN
  Administered 2019-12-15: 30 mL via PERINEURAL

## 2019-12-15 SURGICAL SUPPLY — 53 items
BANDAGE ESMARK 6X9 LF (GAUZE/BANDAGES/DRESSINGS) IMPLANT
BIT DRILL PROFILE 7.0 FT (DRILL) IMPLANT
BLADE SURG 10 STRL SS (BLADE) IMPLANT
BLADE SURG 15 STRL LF DISP TIS (BLADE) ×1 IMPLANT
BLADE SURG 15 STRL SS (BLADE) ×3
BNDG CMPR 9X4 STRL LF SNTH (GAUZE/BANDAGES/DRESSINGS)
BNDG CMPR 9X6 STRL LF SNTH (GAUZE/BANDAGES/DRESSINGS)
BNDG COHESIVE 4X5 TAN STRL (GAUZE/BANDAGES/DRESSINGS) IMPLANT
BNDG COHESIVE 6X5 TAN STRL LF (GAUZE/BANDAGES/DRESSINGS) ×3 IMPLANT
BNDG ESMARK 4X9 LF (GAUZE/BANDAGES/DRESSINGS) IMPLANT
BNDG ESMARK 6X9 LF (GAUZE/BANDAGES/DRESSINGS)
BOOT STEPPER DURA LG (SOFTGOODS) ×2 IMPLANT
COVER WAND RF STERILE (DRAPES) IMPLANT
DISSECTOR 4.0MM X 13CM (MISCELLANEOUS) ×3 IMPLANT
DRAPE ARTHROSCOPY W/POUCH 90 (DRAPES) ×3 IMPLANT
DRAPE OEC MINIVIEW 54X84 (DRAPES) IMPLANT
DRAPE U-SHAPE 47X51 STRL (DRAPES) ×3 IMPLANT
DRILL PROFILE 7.0 FT (DRILL) ×3
DRSG EMULSION OIL 3X3 NADH (GAUZE/BANDAGES/DRESSINGS) ×3 IMPLANT
DURAPREP 26ML APPLICATOR (WOUND CARE) ×3 IMPLANT
ELECT REM PT RETURN 9FT ADLT (ELECTROSURGICAL) ×3
ELECTRODE REM PT RTRN 9FT ADLT (ELECTROSURGICAL) ×1 IMPLANT
EXCALIBUR 3.8MM X 13CM (MISCELLANEOUS) ×3 IMPLANT
GAUZE SPONGE 4X4 12PLY STRL (GAUZE/BANDAGES/DRESSINGS) ×3 IMPLANT
GLOVE BIOGEL PI IND STRL 9 (GLOVE) ×1 IMPLANT
GLOVE BIOGEL PI INDICATOR 9 (GLOVE) ×2
GLOVE SURG ORTHO 9.0 STRL STRW (GLOVE) ×3 IMPLANT
GOWN STRL REUS W/ TWL LRG LVL3 (GOWN DISPOSABLE) ×1 IMPLANT
GOWN STRL REUS W/ TWL XL LVL3 (GOWN DISPOSABLE) ×1 IMPLANT
GOWN STRL REUS W/TWL LRG LVL3 (GOWN DISPOSABLE) ×3
GOWN STRL REUS W/TWL XL LVL3 (GOWN DISPOSABLE) ×3
GUIDEWIRE W/TRCR TIP 2.4X9.25 (WIRE) ×4 IMPLANT
MANIFOLD NEPTUNE II (INSTRUMENTS) IMPLANT
PACK ARTHROSCOPY DSU (CUSTOM PROCEDURE TRAY) ×3 IMPLANT
PACK BASIN DAY SURGERY FS (CUSTOM PROCEDURE TRAY) ×3 IMPLANT
PENCIL SMOKE EVACUATOR (MISCELLANEOUS) ×3 IMPLANT
PORT APPOLLO RF 90DEGREE MULTI (SURGICAL WAND) IMPLANT
PROBE APOLLO 90XL (SURGICAL WAND) ×3 IMPLANT
PUTTY DBM ALLOSYNC PURE 5CC (Putty) ×2 IMPLANT
SCREW COMPR FT 7X70 (Screw) ×2 IMPLANT
SCREW COMPR FT 7X80 (Screw) ×2 IMPLANT
SLEEVE SCD COMPRESS KNEE MED (MISCELLANEOUS) IMPLANT
SPONGE LAP 18X18 RF (DISPOSABLE) ×3 IMPLANT
STRAP ANKLE FOOT DISTRACTOR (ORTHOPEDIC SUPPLIES) ×3 IMPLANT
SUCTION FRAZIER HANDLE 10FR (MISCELLANEOUS)
SUCTION TUBE FRAZIER 10FR DISP (MISCELLANEOUS) IMPLANT
SUT ETHILON 2 0 FSLX (SUTURE) ×3 IMPLANT
SYR 5ML LL (SYRINGE) IMPLANT
SYR BULB 3OZ (MISCELLANEOUS) IMPLANT
TOWEL GREEN STERILE FF (TOWEL DISPOSABLE) ×3 IMPLANT
TUBING ARTHROSCOPY IRRIG 16FT (MISCELLANEOUS) ×3 IMPLANT
WATER STERILE IRR 1000ML POUR (IV SOLUTION) ×3 IMPLANT
YANKAUER SUCT BULB TIP NO VENT (SUCTIONS) IMPLANT

## 2019-12-15 NOTE — Discharge Instructions (Signed)

## 2019-12-15 NOTE — Progress Notes (Signed)
Assisted Dr. Carswell Jackson with left, ultrasound guided, popliteal, adductor canal block. Side rails up, monitors on throughout procedure. See vital signs in flow sheet. Tolerated Procedure well. 

## 2019-12-15 NOTE — H&P (Signed)
[name] is an 49 y.o. female.   Chief Complaint: Left subtalar ankle pain. HPI: Patient is a 49 year old woman who is status post left ankle fusion.  Patient has had good interval relief of her traumatic arthritic pain of the left ankle but has now developed subtalar pain.  She has failed conservative therapy and presents at this time for subtalar fusion.  Past Medical History:  Diagnosis Date  . Anxiety    Panic attacks  . Arthritis   . Arthritis of left subtalar joint   . Bipolar disorder ([place]) 01/21/13  . Chronic pain   . COPD (chronic obstructive pulmonary disease) ([place])   . Depression   . Drug-seeking behavior   . Dyspnea    with much activity  . GERD (gastroesophageal reflux disease)   . Head injury with loss of consciousness ([place])   . Leukocytoclastic vasculitis ([place])   . Lupus ([place])   . Lupus ([place])   . MRSA (methicillin resistant Staphylococcus aureus)   . Tobacco abuse     Past Surgical History:  Procedure Laterality Date  . ANKLE ARTHROSCOPY Left 06/25/2018   Procedure: LEFT ANKLE ARTHROSCOPY;  Surgeon: Newt Minion, MD;  Location: [place];  Service: Orthopedics;  Laterality: Left;  . ANKLE FUSION Left 05/15/2019   Procedure: LEFT ANKLE FUSION;  Surgeon: Newt Minion, MD;  Location: [place];  Service: Orthopedics;  Laterality: Left;  . ANKLE SURGERY Left   . APPLICATION OF WOUND VAC Left 05/15/2019   Procedure: Application Of Wound Vac;  Surgeon: Newt Minion, MD;  Location: [place];  Service: Orthopedics;  Laterality: Left;  . CHOLECYSTECTOMY    . HARDWARE REMOVAL  09/25/2012   Procedure: HARDWARE REMOVAL;  Surgeon: Jessy Oto, MD;  Location: [place];  Service: Orthopedics;  Laterality: Left;  Incision, drainage and debridement of left lateral ankle, removal of plate and screws  . TUBAL LIGATION      Family History  Problem Relation Age of Onset  . Stroke Mother    Social History:  reports that she has been smoking cigarettes. She has a 37.00 pack-year smoking  history. She has never used smokeless tobacco. She reports current drug use. Drug: Marijuana. She reports that she does not drink alcohol.  Allergies:  Allergies  Allergen Reactions  . Subutex [Buprenorphine] Nausea And Vomiting    Makes pt very lethargic    Medications Prior to Admission  Medication Sig Dispense Refill  . acetaminophen (TYLENOL) 325 MG tablet Take 1-2 tablets (325-650 mg total) by mouth every 6 (six) hours as needed for mild pain or moderate pain. 60 tablet 0  . escitalopram (LEXAPRO) 10 MG tablet Take 10 mg by mouth daily.    Marland Kitchen omeprazole (PRILOSEC) 20 MG capsule Take 20 mg by mouth daily.    Marland Kitchen oxyCODONE-acetaminophen (PERCOCET/ROXICET) 5-325 MG tablet TAKE 1 TABLET BY MOUTH EVERY 8 (EIGHT) HOURS AS NEEDED FOR SEVERE PAIN. 20 tablet 0  . PROAIR HFA 108 (90 Base) MCG/ACT inhaler Take 1-2 puffs by mouth every 4 (four) hours as needed (shortness of breath/cough).   1  . SYMBICORT 160-4.5 MCG/ACT inhaler Take 2 puffs by mouth 2 (two) times daily.  6  . tiotropium (SPIRIVA) 18 MCG inhalation capsule Place 18 mcg into inhaler and inhale at bedtime.    . diphenhydramine-acetaminophen (TYLENOL PM) 25-500 MG TABS Take 1 tablet by mouth at bedtime as needed (sleep).       No results found for this or any previous visit (  from the past 48 hour(s)). No results found.  Review of Systems  All other systems reviewed and are negative.   Blood pressure 124/85, pulse 89, temperature (!) 96.5 F (35.8 C), temperature source Tympanic, resp. rate 17, height 5\' 3"  (1.6 m), weight 88 kg, SpO2 97 %. Physical Exam  Examination patient has a good dorsalis pedis pulse she has a stable ankle fusion this is not painful with weightbearing or attempted manipulation.  Patient has point tenderness to palpation in the sinus Tarsi and pain with inversion and eversion of the subtalar joint. Assessment/Plan Assessment: Subtalar arthrosis status post ankle fusion.  Plan: We will plan for left  arthroscopic subtalar arthrodesis.  Risks and benefits were discussed including infection neurovascular injury persistent pain need for additional surgery.  Patient states she understands wished to proceed at this time.  , MD 12/15/2019, 7:03 AM

## 2019-12-15 NOTE — Anesthesia Preprocedure Evaluation (Addendum)
Anesthesia Evaluation  Patient identified by MRN, date of birth, ID band Patient awake    Reviewed: Allergy & Precautions, NPO status , Patient's Chart, lab work & pertinent test results  History of Anesthesia Complications Negative for: history of anesthetic complications  Airway Mallampati: II  TM Distance: >3 FB Neck ROM: Full    Dental  (+) Edentulous Upper, Edentulous Lower   Pulmonary COPD,  COPD inhaler, Current SmokerPatient did not abstain from smoking.,  12/11/2019 SARS coronavirus NEG   breath sounds clear to auscultation       Cardiovascular negative cardio ROS   Rhythm:Regular Rate:Normal     Neuro/Psych negative neurological ROS     GI/Hepatic Neg liver ROS, GERD  Controlled,  Endo/Other  Morbid obesitylupus  Renal/GU negative Renal ROS     Musculoskeletal  (+) Arthritis ,   Abdominal (+) + obese,   Peds  Hematology negative hematology ROS (+)   Anesthesia Other Findings   Reproductive/Obstetrics                            Anesthesia Physical Anesthesia Plan  ASA: III  Anesthesia Plan: General   Post-op Pain Management: GA combined w/ Regional for post-op pain   Induction: Intravenous  PONV Risk Score and Plan: 2 and Ondansetron and Dexamethasone  Airway Management Planned: Oral ETT  Additional Equipment:   Intra-op Plan:   Post-operative Plan: Extubation in OR  Informed Consent: I have reviewed the patients History and Physical, chart, labs and discussed the procedure including the risks, benefits and alternatives for the proposed anesthesia with the patient or authorized representative who has indicated his/her understanding and acceptance.       Plan Discussed with: CRNA and Surgeon  Anesthesia Plan Comments: (Plan routine monitors, GETA with adductor canal and popliteal blocks for post op analgesia)       Anesthesia Quick Evaluation

## 2019-12-15 NOTE — Anesthesia Procedure Notes (Signed)
Anesthesia Regional Block: Adductor canal block   Pre-Anesthetic Checklist: ,, timeout performed, Correct Patient, Correct Site, Correct Laterality, Correct Procedure, Correct Position, site marked, Risks and benefits discussed,  Surgical consent,  Pre-op evaluation,  At surgeon's request and post-op pain management  Laterality: Left and Lower  Prep: chloraprep       Needles:   Needle Type: Echogenic Needle     Needle Length: 9cm  Needle Gauge: 21     Additional Needles:   Procedures:,,,, ultrasound used (permanent image in chart),,,,  Narrative:  Start time: 12/15/2019 8:14 AM End time: 12/15/2019 8:20 AM Injection made incrementally with aspirations every 5 mL.  Performed by: Personally  Anesthesiologist: Jairo Ben, MD  Additional Notes: Pt identified in Holding room.  Monitors applied. Working IV access confirmed. Sterile prep, drape L thigh.  #21ga ECHOgenic needle into adductor canal with US guidance.  20cc 0.75% Ropivacaine injected incrementally after negative test dose.  Patient asymptomatic, VSS, no heme aspirated, tolerated well.  Sandford Craze, MD

## 2019-12-15 NOTE — Anesthesia Procedure Notes (Signed)
Anesthesia Regional Block: Popliteal block   Pre-Anesthetic Checklist: ,, timeout performed, Correct Patient, Correct Site, Correct Laterality, Correct Procedure, Correct Position, site marked, Risks and benefits discussed,  Surgical consent,  Pre-op evaluation,  At surgeon's request and post-op pain management  Laterality: Left and Lower  Prep: chloraprep       Needles:  Injection technique: Single-shot  Needle Type: Echogenic Stimulator Needle     Needle Length: 9cm  Needle Gauge: 21     Additional Needles:   Procedures:, nerve stimulator,,, ultrasound used (permanent image in chart),,,,   Nerve Stimulator or Paresthesia:  Response: toe twitch, 0.4 mA, 0.1 ms,   Additional Responses:   Narrative:  Start time: 12/15/2019 8:21 AM End time: 12/15/2019 8:25 AM Injection made incrementally with aspirations every 5 mL.  Performed by: Personally  Anesthesiologist: Jairo Ben, MD  Additional Notes: Pt identified in Holding room.  Monitors applied. Working IV access confirmed. Sterile prep L lateral knee.  #21ga ECHOgenic PNS to toe twitch at 0.72mA threshold with US guidance.  30cc 0.5% Bupivacaine with 1:200k epi injected incrementally after negative test dose.  Patient asymptomatic, VSS, no heme aspirated, tolerated well.  Sandford Craze, MD

## 2019-12-15 NOTE — Anesthesia Procedure Notes (Signed)
Procedure Name: Intubation Date/Time: 12/15/2019 8:44 AM Performed by: Marny Lowenstein, CRNA Pre-anesthesia Checklist: Patient identified, Emergency Drugs available, Suction available and Patient being monitored Patient Re-evaluated:Patient Re-evaluated prior to induction Oxygen Delivery Method: Circle system utilized Preoxygenation: Pre-oxygenation with 100% oxygen Induction Type: IV induction Ventilation: Mask ventilation without difficulty Laryngoscope Size: Miller and 2 Grade View: Grade I Tube type: Oral Tube size: 7.0 mm Number of attempts: 1 Airway Equipment and Method: Stylet Placement Confirmation: ETT inserted through vocal cords under direct vision,  positive ETCO2 and breath sounds checked- equal and bilateral Secured at: 21 cm Tube secured with: Tape Dental Injury: Teeth and Oropharynx as per pre-operative assessment

## 2019-12-15 NOTE — Anesthesia Postprocedure Evaluation (Signed)
Anesthesia Post Note  Patient: [name]  Procedure(s) Performed: LEFT ARTHROSCOPIC SUBTALAR FUSION (Left Ankle)     Patient location during evaluation: Phase II Anesthesia Type: General Level of consciousness: awake and alert, patient cooperative and oriented Pain management: pain level controlled Vital Signs Assessment: post-procedure vital signs reviewed and stable Respiratory status: spontaneous breathing, nonlabored ventilation and respiratory function stable Cardiovascular status: blood pressure returned to baseline and stable Postop Assessment: no apparent nausea or vomiting Anesthetic complications: no    Last Vitals:  Vitals:   12/15/19 1045 12/15/19 1100  BP: 122/75 110/75  Pulse: 84 91  Resp: (!) 23 12  Temp:    SpO2: 98% 98%    Last Pain:  Vitals:   12/15/19 1100  TempSrc:   PainSc: Asleep    LLE Motor Response: Purposeful movement (12/15/19 1100) LLE Sensation: Full sensation (12/15/19 1100)          Satin Boal,E. Maija Biggers

## 2019-12-15 NOTE — Transfer of Care (Signed)
Immediate Anesthesia Transfer of Care Note  Patient: [name]  Procedure(s) Performed: LEFT ARTHROSCOPIC SUBTALAR FUSION (Left Ankle)  Patient Location: PACU  Anesthesia Type:General and Regional  Level of Consciousness: drowsy and patient cooperative  Airway & Oxygen Therapy: Patient Spontanous Breathing and Patient connected to face mask oxygen  Post-op Assessment: Report given to RN and Post -op Vital signs reviewed and stable  Post vital signs: Reviewed and stable  Last Vitals:  Vitals Value Taken Time  BP 130/77 12/15/19 0953  Temp    Pulse 96 12/15/19 0958  Resp 26 12/15/19 0958  SpO2 100 % 12/15/19 0958  Vitals shown include unvalidated device data.  Last Pain:  Vitals:   12/15/19 0830  TempSrc:   PainSc: 0-No pain      Patients Stated Pain Goal: 8 (12/15/19 8118)  Complications: No apparent anesthesia complications

## 2019-12-15 NOTE — Op Note (Signed)
12/15/2019  9:56 AM  PATIENT:  [name]    PRE-OPERATIVE DIAGNOSIS:  Left Subtalar Arthritis  POST-OPERATIVE DIAGNOSIS:  Same  PROCEDURE:  LEFT ARTHROSCOPIC SUBTALAR FUSION C-arm fluoroscopy to verify alignment and screw placement  SURGEON:  Nadara Mustard, MD  PHYSICIAN ASSISTANT:None ANESTHESIA:   General  PREOPERATIVE INDICATIONS:  [name] is a  49 y.o. female with a diagnosis of Left Subtalar Arthritis who failed conservative measures and elected for surgical management.    The risks benefits and alternatives were discussed with the patient preoperatively including but not limited to the risks of infection, bleeding, nerve injury, cardiopulmonary complications, the need for revision surgery, among others, and the patient was willing to proceed.  OPERATIVE IMPLANTS: 2 of the conical Arthrex cannulated screws with demineralized bone matrix putty  @ENCIMAGES @  OPERATIVE FINDINGS: Subtalar joint showed arthritic changes with loose bodies.  OPERATIVE PROCEDURE: Patient brought the operating room underwent a popliteal block then underwent a general anesthetic.  After adequate levels anesthesia were obtained patient was placed prone on the operating table the left lower extremity was prepped using DuraPrep draped into a sterile field a timeout was called.  The scope was inserted through the posterior medial portal and a posterior lateral working portal was established.  An Esmarch was placed around the ankle for tourniquet control.  Using the shaver and the electrical wand the soft tissue was debrided from the subtalar joint.  The 3.8 mm bur was then used to debride the subtalar joint.  There were multiple loose bodies.  This was debrided back to bleeding viable subchondral bone.  The joint was cleansed the subtalar joint was then packed arthroscopically with the demineralized bone matrix.  Attention was then focused on internal fixation.  A incision was made over the heel 2  guidewires were placed from the calcaneus into the talus C-arm fluoroscopy verified alignment.  2 Arthrex conical screws were placed 80 and 70 mm in length.  C-arm fluoroscopy verified alignment.  The guidewire was removed instruments were removed the portals were closed using 2-0 nylon a sterile dressing was applied patient was placed in a fracture boot extubated taken the PACU in stable condition.   DISCHARGE PLANNING:  Antibiotic duration: Preoperative antibiotics  Weightbearing: Nonweightbearing on the left  Pain medication: Prescription for 10 mg Percocet tablets  Dressing care/ Wound VAC: Dry dressing change at follow-up  Ambulatory devices: Walker or crutches  Discharge to: Home.  Follow-up: In the office 1 week post operative.

## 2019-12-16 ENCOUNTER — Encounter: Payer: Self-pay | Admitting: *Deleted

## 2019-12-21 ENCOUNTER — Telehealth: Payer: Self-pay | Admitting: Orthopedic Surgery

## 2019-12-21 NOTE — Telephone Encounter (Signed)
This was signed and will be faxed today.

## 2019-12-21 NOTE — Telephone Encounter (Signed)
Arthur from [employer] called.   He is following up on a certificate of medical necessity that was sent over on the patient's behalf on January 25th  Call back number: [phone]

## 2019-12-22 ENCOUNTER — Ambulatory Visit (INDEPENDENT_AMBULATORY_CARE_PROVIDER_SITE_OTHER): Payer: Medicaid Other

## 2019-12-22 ENCOUNTER — Ambulatory Visit (INDEPENDENT_AMBULATORY_CARE_PROVIDER_SITE_OTHER): Payer: Medicaid Other | Admitting: Orthopedic Surgery

## 2019-12-22 ENCOUNTER — Encounter: Payer: Self-pay | Admitting: Orthopedic Surgery

## 2019-12-22 ENCOUNTER — Other Ambulatory Visit: Payer: Self-pay

## 2019-12-22 VITALS — Ht 63.0 in | Wt 194.0 lb

## 2019-12-22 DIAGNOSIS — Z981 Arthrodesis status: Secondary | ICD-10-CM

## 2019-12-22 MED ORDER — GABAPENTIN 300 MG PO CAPS: 300.0000 mg | ORAL_CAPSULE | Freq: Three times a day (TID) | ORAL | 3 refills | Status: DC

## 2019-12-22 MED ORDER — OXYCODONE-ACETAMINOPHEN 10-325 MG PO TABS: 1.0000 | ORAL_TABLET | ORAL | 0 refills | Status: DC | PRN

## 2019-12-22 NOTE — Progress Notes (Signed)
Office Visit Note   Patient: [name]           Date of Birth: 13-Mar-1971           MRN: [id number] Visit Date: 12/22/2019              Requested by: Practice, [facility] [address] [place],  [place] [address] PCP: [facility]  Chief Complaint  Patient presents with  . Left Ankle - Routine Post Op    12/15/19 left arthroscopic subtalar fusion       HPI: Patient presents today 1 week status post arthroscopic left subtalar fusion she is doing well she does have some pain and some neuropathic pain at night .  She denies fever, chills, or calf pain  Assessment & Plan: Visit Diagnoses:  1. S/P ankle fusion     Plan: Follow up 1 week for suture removel  Follow-Up Instructions: No follow-ups on file.   Ortho Exam  Patient is alert, oriented, no adenopathy, well-dressed, normal affect, normal respiratory effort.  Examination of her left ankle: She has mild to moderate amount of soft tissue swelling sutures are intact pulses are palpable distally her compartments are soft and compressible and nontender.  No erythema or drainage Imaging: No results found. No images are attached to the encounter.  Labs: Lab Results  Component Value Date   HGBA1C 5.1 10/05/2013   HGBA1C 5.5 09/25/2012   HGBA1C 5.5 09/13/2012   ESRSEDRATE 18 01/14/2013   ESRSEDRATE 28 (H) 11/24/2012   ESRSEDRATE 18 10/21/2012   CRP 7.1 (H) 01/14/2013   CRP 1.6 (H) 11/24/2012   CRP 0.6 (H) 10/21/2012   REPTSTATUS 06/29/2017 FINAL 06/27/2017   GRAMSTAIN  09/25/2012    MODERATE WBC PRESENT,BOTH PMN AND MONONUCLEAR NO ORGANISMS SEEN Performed at [facility]   GRAMSTAIN  09/25/2012    MODERATE WBC PRESENT,BOTH PMN AND MONONUCLEAR NO ORGANISMS SEEN Performed at [facility]   GRAMSTAIN  09/25/2012    MODERATE WBC PRESENT,BOTH PMN AND MONONUCLEAR NO ORGANISMS SEEN   CULT NO GROUP A STREP (S.PYOGENES) ISOLATED 06/27/2017   LABORGA ESCHERICHIA COLI  03/25/2013     Lab Results  Component Value Date   ALBUMIN 3.3 (L) 06/25/2018   ALBUMIN 3.7 09/03/2013   ALBUMIN 3.0 (L) 03/25/2013    Lab Results  Component Value Date   MG 1.7 07/16/2012   MG 2.4 01/09/2011   No results found for: VD25OH  No results found for: PREALBUMIN CBC EXTENDED Latest Ref Rng & Units 05/15/2019 06/25/2018 06/27/2017  WBC 4.0 - 10.5 K/uL 8.6 - -  RBC 3.87 - 5.11 MIL/uL 5.18(H) - -  HGB 12.0 - 15.0 g/dL 03.4 74.2 59.5  HCT 63.8 - 46.0 % 45.4 - 40.0  PLT 150 - 400 K/uL 249 - -  NEUTROABS 1.7 - 7.7 K/uL - - -  LYMPHSABS 0.7 - 4.0 K/uL - - -     Body mass index is 34.37 kg/m.  Orders:  Orders Placed This Encounter  Procedures  . XR Ankle 2 Views Left   Meds ordered this encounter  Medications  . oxyCODONE-acetaminophen (PERCOCET) 10-325 MG tablet    Sig: Take 1 tablet by mouth every 4 (four) hours as needed for pain.    Dispense:  30 tablet    Refill:  0  . gabapentin (NEURONTIN) 300 MG capsule    Sig: Take 1 capsule (300 mg total) by mouth 3 (three) times daily. 3 times a  day when necessary neuropathy pain    Dispense:  90 capsule    Refill:  3     Procedures: No procedures performed  Clinical Data: No additional findings.  ROS:  All other systems negative, except as noted in the HPI. Review of Systems  Objective: Vital Signs: Ht 5\' 3"  (1.6 m)   Wt 194 lb (88 kg)   LMP 06/22/2018   BMI 34.37 kg/m   Specialty Comments:  No specialty comments available.  PMFS History: Patient Active Problem List   Diagnosis Date Noted  . Status post ankle fusion 05/15/2019  . Post-traumatic arthritis of ankle, left   . Traumatic arthritis of left ankle   . Rash and nonspecific skin eruption 10/06/2013  . URI, acute 10/06/2013  . Obesity, unspecified 10/06/2013  . Tobacco use disorder 10/05/2013  . Hoarseness, chronic 09/05/2013  . 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 Bipolar disorder ([place])   . Polysubstance abuse ([place]) 09/13/2012  .  Anxiety and depression 07/19/2012  . 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 Anxiety    Panic attacks  . Arthritis   . Arthritis of left subtalar joint   . Bipolar disorder ([place]) 01/21/13  . Chronic pain   . COPD (chronic obstructive pulmonary disease) ([place])   . Depression   . Drug-seeking behavior   . Dyspnea    with much activity  . GERD (gastroesophageal reflux disease)   . Head injury with loss of consciousness ([place])   . Leukocytoclastic vasculitis ([place])   . Lupus ([place])   . Lupus ([place])   . MRSA (methicillin resistant Staphylococcus aureus)   . Tobacco abuse     Family History  Problem Relation Age of Onset  . Stroke Mother     Past Surgical History:  Procedure Laterality Date  . ANKLE ARTHROSCOPY Left 06/25/2018   Procedure: LEFT ANKLE ARTHROSCOPY;  Surgeon: Newt Minion, MD;  Location: [place];  Service: Orthopedics;  Laterality: Left;  . ANKLE ARTHROSCOPY WITH FUSION Left 12/15/2019   Procedure: LEFT ARTHROSCOPIC SUBTALAR FUSION;  Surgeon: Newt Minion, MD;  Location: [place];  Service: Orthopedics;  Laterality: Left;  . ANKLE FUSION Left 05/15/2019   Procedure: LEFT ANKLE FUSION;  Surgeon: Newt Minion, MD;  Location: [place];  Service: Orthopedics;  Laterality: Left;  . ANKLE SURGERY Left   . APPLICATION OF WOUND VAC Left 05/15/2019   Procedure: Application Of Wound Vac;  Surgeon: Newt Minion, MD;  Location: [place];  Service: Orthopedics;  Laterality: Left;  . CHOLECYSTECTOMY    . HARDWARE REMOVAL  09/25/2012   Procedure: HARDWARE REMOVAL;  Surgeon: Jessy Oto, MD;  Location: [place];  Service: Orthopedics;  Laterality: Left;  Incision, drainage and debridement of left lateral ankle, removal of plate and screws  . TUBAL LIGATION     Social History   Occupational History  . Not on file  Tobacco Use  . Smoking status: Current Every Day Smoker    Packs/day: 1.00    Years: 37.00    Pack  years: 37.00    Types: Cigarettes  . Smokeless tobacco: Never Used  Substance and Sexual Activity  . Alcohol use: No  . Drug use: Yes    Types: Marijuana    Comment: last smoked 33mo ago  . Sexual activity: Yes    Birth control/protection: Post-menopausal    Comment: BTL

## 2019-12-29 ENCOUNTER — Other Ambulatory Visit: Payer: Self-pay

## 2019-12-29 ENCOUNTER — Ambulatory Visit (INDEPENDENT_AMBULATORY_CARE_PROVIDER_SITE_OTHER): Payer: Medicaid Other | Admitting: Orthopedic Surgery

## 2019-12-29 ENCOUNTER — Encounter: Payer: Self-pay | Admitting: Orthopedic Surgery

## 2019-12-29 VITALS — Ht 63.0 in | Wt 194.0 lb

## 2019-12-29 DIAGNOSIS — M19172 Post-traumatic osteoarthritis, left ankle and foot: Secondary | ICD-10-CM

## 2019-12-29 MED ORDER — OXYCODONE-ACETAMINOPHEN 10-325 MG PO TABS: 1.0000 | ORAL_TABLET | ORAL | 0 refills | Status: DC | PRN

## 2019-12-29 NOTE — Progress Notes (Signed)
Office Visit Note   Patient: [name]           Date of Birth: 07-20-1971           MRN: [id number] Visit Date: 12/29/2019              Requested by: [facility], [facility] [address] [place],  [place] [address] PCP: [facility]  No chief complaint on file.     HPI: This is a pleasant woman who is 2 weeks status post left arthroscopic subtalar arthrodesis.  She did have x-rays taken a week ago.  She is here for suture removal. She wants.  Is otherwise doing well  Assessment & Plan: Visit Diagnoses: No diagnosis found.  Plan: Sutures were removed. She will begin desensitizing her foot. Follow up 2 weeks at which time ankle xrays should be obtained.   Follow-Up Instructions: No follow-ups on file.   Ortho Exam  Patient is alert, oriented, no adenopathy, well-dressed, normal affect, normal respiratory effort. Well healed arthroscopic incisions. No surrounding erythema or drainage. Compartments soft. Distal pulses intact  Imaging: No results found. No images are attached to the encounter.  Labs: Lab Results  Component Value Date   HGBA1C 5.1 10/05/2013   HGBA1C 5.5 09/25/2012   HGBA1C 5.5 09/13/2012   ESRSEDRATE 18 01/14/2013   ESRSEDRATE 28 (H) 11/24/2012   ESRSEDRATE 18 10/21/2012   CRP 7.1 (H) 01/14/2013   CRP 1.6 (H) 11/24/2012   CRP 0.6 (H) 10/21/2012   REPTSTATUS 06/29/2017 FINAL 06/27/2017   GRAMSTAIN  09/25/2012    MODERATE WBC PRESENT,BOTH PMN AND MONONUCLEAR NO ORGANISMS SEEN Performed at [facility]   GRAMSTAIN  09/25/2012    MODERATE WBC PRESENT,BOTH PMN AND MONONUCLEAR NO ORGANISMS SEEN Performed at [facility]   GRAMSTAIN  09/25/2012    MODERATE WBC PRESENT,BOTH PMN AND MONONUCLEAR NO ORGANISMS SEEN   CULT NO GROUP A STREP (S.PYOGENES) ISOLATED 06/27/2017   LABORGA ESCHERICHIA COLI 03/25/2013     Lab Results  Component Value Date   ALBUMIN 3.3 (L) 06/25/2018   ALBUMIN 3.7 09/03/2013     ALBUMIN 3.0 (L) 03/25/2013    Lab Results  Component Value Date   MG 1.7 07/16/2012   MG 2.4 01/09/2011   No results found for: VD25OH  No results found for: PREALBUMIN CBC EXTENDED Latest Ref Rng & Units 05/15/2019 06/25/2018 06/27/2017  WBC 4.0 - 10.5 K/uL 8.6 - -  RBC 3.87 - 5.11 MIL/uL 5.18(H) - -  HGB 12.0 - 15.0 g/dL 62.1 30.8 65.7  HCT 84.6 - 46.0 % 45.4 - 40.0  PLT 150 - 400 K/uL 249 - -  NEUTROABS 1.7 - 7.7 K/uL - - -  LYMPHSABS 0.7 - 4.0 K/uL - - -     Body mass index is 34.37 kg/m.  Orders:  No orders of the defined types were placed in this encounter.  Meds ordered this encounter  Medications  . oxyCODONE-acetaminophen (PERCOCET) 10-325 MG tablet    Sig: Take 1 tablet by mouth every 4 (four) hours as needed for pain.    Dispense:  30 tablet    Refill:  0     Procedures: No procedures performed  Clinical Data: No additional findings.  ROS:  All other systems negative, except as noted in the HPI. Review of Systems  Objective: Vital Signs: Ht 5\' 3"  (1.6 m)   Wt 194 lb (88 kg)   LMP 06/22/2018   BMI 34.37 kg/m  Specialty Comments:  No specialty comments available.  PMFS History: Patient Active Problem List   Diagnosis Date Noted  . Status post ankle fusion 05/15/2019  . Post-traumatic arthritis of ankle, left   . Traumatic arthritis of left ankle   . Rash and nonspecific skin eruption 10/06/2013  . URI, acute 10/06/2013  . Obesity, unspecified 10/06/2013  . Tobacco use disorder 10/05/2013  . Hoarseness, chronic 09/05/2013  . 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 Bipolar disorder ([place])   . Polysubstance abuse ([place]) 09/13/2012  . Anxiety and depression 07/19/2012  . 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 Anxiety    Panic attacks  . Arthritis   . Arthritis of left subtalar joint   . Bipolar disorder ([place]) 01/21/13  . Chronic pain   . COPD (chronic obstructive pulmonary disease)  ([place])   . Depression   . Drug-seeking behavior   . Dyspnea    with much activity  . GERD (gastroesophageal reflux disease)   . Head injury with loss of consciousness ([place])   . Leukocytoclastic vasculitis ([place])   . Lupus ([place])   . Lupus ([place])   . MRSA (methicillin resistant Staphylococcus aureus)   . Tobacco abuse     Family History  Problem Relation Age of Onset  . Stroke Mother     Past Surgical History:  Procedure Laterality Date  . ANKLE ARTHROSCOPY Left 06/25/2018   Procedure: LEFT ANKLE ARTHROSCOPY;  Surgeon: Newt Minion, MD;  Location: [place];  Service: Orthopedics;  Laterality: Left;  . ANKLE ARTHROSCOPY WITH FUSION Left 12/15/2019   Procedure: LEFT ARTHROSCOPIC SUBTALAR FUSION;  Surgeon: Newt Minion, MD;  Location: [place];  Service: Orthopedics;  Laterality: Left;  . ANKLE FUSION Left 05/15/2019   Procedure: LEFT ANKLE FUSION;  Surgeon: Newt Minion, MD;  Location: [place];  Service: Orthopedics;  Laterality: Left;  . ANKLE SURGERY Left   . APPLICATION OF WOUND VAC Left 05/15/2019   Procedure: Application Of Wound Vac;  Surgeon: Newt Minion, MD;  Location: [place];  Service: Orthopedics;  Laterality: Left;  . CHOLECYSTECTOMY    . HARDWARE REMOVAL  09/25/2012   Procedure: HARDWARE REMOVAL;  Surgeon: Jessy Oto, MD;  Location: [place];  Service: Orthopedics;  Laterality: Left;  Incision, drainage and debridement of left lateral ankle, removal of plate and screws  . TUBAL LIGATION     Social History   Occupational History  . Not on file  Tobacco Use  . Smoking status: Current Every Day Smoker    Packs/day: 1.00    Years: 37.00    Pack years: 37.00    Types: Cigarettes  . Smokeless tobacco: Never Used  Substance and Sexual Activity  . Alcohol use: No  . Drug use: Yes    Types: Marijuana    Comment: last smoked 25mo ago  . Sexual activity: Yes    Birth control/protection: Post-menopausal    Comment: BTL

## 2020-01-05 ENCOUNTER — Telehealth: Payer: Self-pay | Admitting: Orthopedic Surgery

## 2020-01-05 MED ORDER — OXYCODONE-ACETAMINOPHEN 10-325 MG PO TABS: 1.0000 | ORAL_TABLET | Freq: Four times a day (QID) | ORAL | 0 refills | Status: DC | PRN

## 2020-01-05 NOTE — Telephone Encounter (Signed)
Pt is s/p on 12/15/19 a left arthroscopic subtalar fusion and is requesting a refill on oxycodone. She has had 2 refills so far this month for a total of 60 Oxycodone 10/325 and 70 the month of january. Please advise.

## 2020-01-05 NOTE — Telephone Encounter (Signed)
Pt called in requesting a refill of oxycodone sent in to [facility] on [address].   [phone]

## 2020-01-12 ENCOUNTER — Encounter: Payer: Self-pay | Admitting: Orthopedic Surgery

## 2020-01-12 ENCOUNTER — Other Ambulatory Visit: Payer: Self-pay

## 2020-01-12 ENCOUNTER — Ambulatory Visit (INDEPENDENT_AMBULATORY_CARE_PROVIDER_SITE_OTHER): Payer: Medicaid Other | Admitting: Physician Assistant

## 2020-01-12 ENCOUNTER — Ambulatory Visit (INDEPENDENT_AMBULATORY_CARE_PROVIDER_SITE_OTHER): Payer: Medicaid Other

## 2020-01-12 VITALS — Ht 63.0 in | Wt 194.0 lb

## 2020-01-12 DIAGNOSIS — M19172 Post-traumatic osteoarthritis, left ankle and foot: Secondary | ICD-10-CM

## 2020-01-12 MED ORDER — OXYCODONE-ACETAMINOPHEN 10-325 MG PO TABS: 1.0000 | ORAL_TABLET | Freq: Four times a day (QID) | ORAL | 0 refills | Status: DC | PRN

## 2020-01-12 NOTE — Progress Notes (Signed)
Office Visit Note   Patient: [name]           Date of Birth: [birth year]           MRN: [id number] Visit Date: 01/12/2020              Requested by: Practice, [place],  [place] [address] PCP: [facility], [facility]  Chief Complaint  Patient presents with  . Left Ankle - Routine Post Op    12/15/19 left arthroscopic subtalar fusion       HPI: This is a pleasant woman who is now 4 weeks status post arthroscopic left subtalar arthritic thesis.  She is doing well.  She does have some sensitivity which she is working on she has begun trying to place weight on her foot.  Assessment & Plan: Visit Diagnoses:  1. Post-traumatic arthritis of ankle, left     Plan: She should begin weightbearing in her boot I given her some felt heel pads which she is used in the past.  She will follow-up in 4 weeks at which time new radiographs should be taken  Follow-Up Instructions: No follow-ups on file.   Ortho Exam  Patient is alert, oriented, no adenopathy, well-dressed, normal affect, normal respiratory effort. Focused examination demonstrates well-healed surgical portals.  Swelling is well controlled she does have some hypersensitivity around her hindfoot.  There is no cellulitis no erythema pulses are palpable  Imaging: No results found. No images are attached to the encounter.  Labs: Lab Results  Component Value Date   HGBA1C 5.1 10/05/2013   HGBA1C 5.5 09/25/2012   HGBA1C 5.5 09/13/2012   ESRSEDRATE 18 01/14/2013   ESRSEDRATE 28 (H) 11/24/2012   ESRSEDRATE 18 10/21/2012   CRP 7.1 (H) 01/14/2013   CRP 1.6 (H) 11/24/2012   CRP 0.6 (H) 10/21/2012   REPTSTATUS 06/29/2017 FINAL 06/27/2017   GRAMSTAIN  09/25/2012    MODERATE WBC PRESENT,BOTH PMN AND MONONUCLEAR NO ORGANISMS SEEN Performed at [facility]  09/25/2012    MODERATE WBC PRESENT,BOTH PMN AND MONONUCLEAR NO ORGANISMS SEEN Performed at [facility]  09/25/2012    MODERATE WBC PRESENT,BOTH PMN AND MONONUCLEAR NO ORGANISMS SEEN   CULT NO GROUP A STREP (S.PYOGENES) ISOLATED 06/27/2017   LABORGA ESCHERICHIA COLI 03/25/2013     Lab Results  Component Value Date   ALBUMIN 3.3 (L) 06/25/2018   ALBUMIN 3.7 09/03/2013   ALBUMIN 3.0 (L) 03/25/2013    Lab Results  Component Value Date   MG 1.7 07/16/2012   MG 2.4 01/09/2011   No results found for: VD25OH  No results found for: PREALBUMIN CBC EXTENDED Latest Ref Rng & Units 05/15/2019 06/25/2018 06/27/2017  WBC 4.0 - 10.5 K/uL 8.6 - -  RBC 3.87 - 5.11 MIL/uL 5.18(H) - -  HGB 12.0 - 15.0 g/dL 14.6 13.7 13.6  HCT 36.0 - 46.0 % 45.4 - 40.0  PLT 150 - 400 K/uL 249 - -  NEUTROABS 1.7 - 7.7 K/uL - - -  LYMPHSABS 0.7 - 4.0 K/uL - - -     Body mass index is 34.37 kg/m.  Orders:  Orders Placed This Encounter  Procedures  . XR Ankle Complete Left   No orders of the defined types were placed in this encounter.    Procedures: No procedures performed  Clinical Data: No additional findings.  ROS:  All other systems negative, except as noted in the HPI.  Review of Systems  Objective: Vital Signs: Ht 5\' 3"  (1.6 m)   Wt 194 lb (88 kg)   LMP 06/22/2018   BMI 34.37 kg/m   Specialty Comments:  No specialty comments available.  PMFS History: Patient Active Problem List   Diagnosis Date Noted  . Status post ankle fusion 05/15/2019  . Post-traumatic arthritis of ankle, left   . Traumatic arthritis of left ankle   . Rash and nonspecific skin eruption 10/06/2013  . URI, acute 10/06/2013  . Obesity, unspecified 10/06/2013  . Tobacco use disorder 10/05/2013  . Hoarseness, chronic 09/05/2013  . 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 Bipolar disorder ([facility])   . Polysubstance abuse ([facility]) 09/13/2012  . Anxiety and depression 07/19/2012  . 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 Anxiety    Panic attacks   . Arthritis   . Arthritis of left subtalar joint   . Bipolar disorder ([facility]) 01/21/13  . Chronic pain   . COPD (chronic obstructive pulmonary disease) ([facility])   . Depression   . Drug-seeking behavior   . Dyspnea    with much activity  . GERD (gastroesophageal reflux disease)   . Head injury with loss of consciousness ([facility])   . 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 Lupus ([facility])   . Lupus ([facility])   . MRSA (methicillin resistant Staphylococcus aureus)   . Tobacco abuse     Family History  Problem Relation Age of Onset  . Stroke Mother     Past Surgical History:  Procedure Laterality Date  . ANKLE ARTHROSCOPY Left 06/25/2018   Procedure: LEFT ANKLE ARTHROSCOPY;  Surgeon: 08/25/2018, MD;  [facility];  Service: Orthopedics;  Laterality: Left;  . ANKLE ARTHROSCOPY WITH FUSION Left 12/15/2019   Procedure: LEFT ARTHROSCOPIC SUBTALAR FUSION;  Surgeon: 12/17/2019, MD;  Location: [facility];  Service: Orthopedics;  Laterality: Left;  . ANKLE FUSION Left 05/15/2019   Procedure: LEFT ANKLE FUSION;  Surgeon: 05/17/2019, MD;  [facility];  Service: Orthopedics;  Laterality: Left;  . ANKLE SURGERY Left   . APPLICATION OF WOUND VAC Left 05/15/2019   Procedure: Application Of Wound Vac;  Surgeon: 05/17/2019, MD;  [facility];  Service: Orthopedics;  Laterality: Left;  . CHOLECYSTECTOMY    . HARDWARE REMOVAL  09/25/2012   Procedure: HARDWARE REMOVAL;  Surgeon: 13/05/2012, MD;  Location[facility] OR;  Service: Orthopedics;  Laterality: Left;  Incision, drainage and debridement of left lateral ankle, removal of plate and screws  . TUBAL LIGATION     Social History   Occupational History  . Not on file  Tobacco Use  . Smoking status: Current Every Day Smoker    Packs/day: 1.00    Years: 37.00    Pack years: 37.00    Types: Cigarettes  . Smokeless tobacco: Never Used  Substance and Sexual Activity  . Alcohol use: No  . Drug use: Yes    Types: Marijuana     Comment: last smoked 46mo ago  . Sexual activity: Yes    Birth control/protection: Post-menopausal    Comment: BTL

## 2020-01-19 ENCOUNTER — Telehealth: Payer: Self-pay | Admitting: Orthopedic Surgery

## 2020-01-19 MED ORDER — OXYCODONE-ACETAMINOPHEN 10-325 MG PO TABS: 1.0000 | ORAL_TABLET | Freq: Three times a day (TID) | ORAL | 0 refills | Status: DC | PRN

## 2020-01-19 NOTE — Telephone Encounter (Signed)
Pt is s/p a subtalar fusion on 12/15/19 she is asking for a refill on pain medication. Oxycodone 10/325 has had a total of 118 tabs in the past 4 weeks last refill was 01/12/20 # 28 please advise.

## 2020-01-19 NOTE — Telephone Encounter (Signed)
Patient called.   She is requesting a refill on her oxyCodone.   Call back number: [phone]

## 2020-01-19 NOTE — Telephone Encounter (Signed)
Patient called. She says the pharmacy says that she needs prior auth from [employer] before they can fill the RX. Her call back number is [phone]

## 2020-01-19 NOTE — Telephone Encounter (Signed)
Message has been sent to Dr. Lajoyce Corners for review will hold this message in box pending advisement .

## 2020-01-22 NOTE — Telephone Encounter (Signed)
This was filled by erin on 01/19/20

## 2020-01-26 ENCOUNTER — Telehealth: Payer: Self-pay | Admitting: Orthopedic Surgery

## 2020-01-26 MED ORDER — OXYCODONE-ACETAMINOPHEN 10-325 MG PO TABS: 1.0000 | ORAL_TABLET | Freq: Two times a day (BID) | ORAL | 0 refills | Status: DC | PRN

## 2020-01-26 NOTE — Telephone Encounter (Signed)
Patient called. Says she was only able to get a week supply of pain medication. She would like a RX for 1wk for oxycodone called in. Her call back number is [phone]

## 2020-01-26 NOTE — Telephone Encounter (Signed)
Pt is requesting refill on her percocet . She received a total of 30 tabs 7 days ago. Has had a total of 148 in the pst 5 weeks. Please advise.

## 2020-01-26 NOTE — Addendum Note (Signed)
Addended by: Barnie Del R on: 01/26/2020 02:08 PM   Modules accepted: Orders

## 2020-01-26 NOTE — Telephone Encounter (Signed)
Entered prior auth in  tracks portal and the status is suspended. Will check back and see if this changes.

## 2020-01-26 NOTE — Telephone Encounter (Signed)
The pt called in stating she needed Dr. Lajoyce Corners to call medicaid and let them know he changed the dosage on her medication so that they'll cover it again.   [phone]

## 2020-01-27 NOTE — Telephone Encounter (Signed)
Checked  tracks portal this morning and prior Berkley Harvey has been approved. Effective 3/8/231- 03/26/20

## 2020-02-03 ENCOUNTER — Other Ambulatory Visit: Payer: Self-pay | Admitting: Family

## 2020-02-03 ENCOUNTER — Telehealth: Payer: Self-pay | Admitting: Orthopedic Surgery

## 2020-02-03 MED ORDER — OXYCODONE-ACETAMINOPHEN 10-325 MG PO TABS: 1.0000 | ORAL_TABLET | Freq: Two times a day (BID) | ORAL | 0 refills | Status: DC | PRN

## 2020-02-03 NOTE — Telephone Encounter (Signed)
Patient called.   Due to medicaid restrictions, she says she was only given half of the prescription called in for her. She wanted to know if there was something Lajoyce Corners could do to help.   Call back: [phone]

## 2020-02-03 NOTE — Telephone Encounter (Signed)
I called pharmacy and they advised they filled the rx for percocet for 7 days supply because the pt wanted it filled that day and did not want to wait for authorization from medicaid. ( this was obtained that day) Erin to write new rx for qty # 30 and pharmacy will honor that today. Called and lm on vm for pt to advise and to call with any questions.

## 2020-02-09 ENCOUNTER — Ambulatory Visit: Payer: Medicaid Other | Admitting: Orthopedic Surgery

## 2020-02-12 ENCOUNTER — Ambulatory Visit: Payer: Medicaid Other | Admitting: Family

## 2020-02-16 ENCOUNTER — Telehealth: Payer: Self-pay | Admitting: Orthopedic Surgery

## 2020-02-16 NOTE — Telephone Encounter (Signed)
Noted  

## 2020-02-16 NOTE — Telephone Encounter (Signed)
Patient called. She would like a refill on her pain medication called in. Her call back number is [phone]

## 2020-02-16 NOTE — Telephone Encounter (Signed)
Pt has an appt scheduled for 02-23-2020 with Erin.

## 2020-02-16 NOTE — Telephone Encounter (Signed)
Can you please call pt and make an appt. She canceled last week and needs follow up. Can discuss pain meds at that time.

## 2020-02-23 ENCOUNTER — Other Ambulatory Visit: Payer: Self-pay

## 2020-02-23 ENCOUNTER — Encounter: Payer: Self-pay | Admitting: Orthopedic Surgery

## 2020-02-23 ENCOUNTER — Ambulatory Visit: Payer: Self-pay

## 2020-02-23 ENCOUNTER — Ambulatory Visit (INDEPENDENT_AMBULATORY_CARE_PROVIDER_SITE_OTHER): Payer: Medicaid Other | Admitting: Orthopedic Surgery

## 2020-02-23 VITALS — Ht 63.0 in | Wt 194.0 lb

## 2020-02-23 DIAGNOSIS — Z981 Arthrodesis status: Secondary | ICD-10-CM

## 2020-02-23 DIAGNOSIS — M19172 Post-traumatic osteoarthritis, left ankle and foot: Secondary | ICD-10-CM | POA: Diagnosis not present

## 2020-02-23 MED ORDER — OXYCODONE-ACETAMINOPHEN 7.5-325 MG PO TABS: 1.0000 | ORAL_TABLET | Freq: Two times a day (BID) | ORAL | 0 refills | Status: DC | PRN

## 2020-02-23 NOTE — Progress Notes (Signed)
Office Visit Note   Patient: [name]           Date of Birth: 1971/07/27           MRN: [id number] Visit Date: 02/23/2020              Requested by: Practice, [place],  [place] [address] PCP: [facility]  Chief Complaint  Patient presents with  . Left Ankle - Routine Post Op    12/15/19 left arthroscopic subtalar fusion       HPI: This is a pleasant woman who is status post arthroscopic left subtalar arthrodesis.  She is now just over 2 months postop.  States she has been working on her weightbearing in her cam walker since last visit she has been using heel pads for additional support in her cam walker.  Has had some difficulty getting to follow-up appointments due to transportation issues continues to have exquisite pain especially at night is tearful recounting how bad her ankle hurts at night.  Can planes of some waxing and waning swelling.  Assessment & Plan: Visit Diagnoses:  1. Post-traumatic arthritis of ankle, left   2. S/P ankle fusion     Plan: Discussed using the cam walker.  No weightbearing out of the cam walker.  Discussed the importance of giving the ankle and foot some prolonged time to heal she will resume nonweightbearing.  She will follow-up in the office in 3 weeks. Follow-Up Instructions: Return in about 3 weeks (around 03/15/2020).   Ortho Exam  Patient is alert, oriented, no adenopathy, well-dressed, normal affect, normal respiratory effort. On examination her incision portals are well-healed there is no erythema she has some very mild swelling appears to have some slight hypersensitivity poor exam due to patient pulling away moving.  Global tenderness.  There is no erythema warmth or sign of infection  Imaging: No results found. No images are attached to the encounter.  Labs: Lab Results  Component Value Date   HGBA1C 5.1 10/05/2013   HGBA1C 5.5 09/25/2012   HGBA1C 5.5 09/13/2012   ESRSEDRATE 18 01/14/2013   ESRSEDRATE 28 (H) 11/24/2012   ESRSEDRATE 18 10/21/2012   CRP 7.1 (H) 01/14/2013   CRP 1.6 (H) 11/24/2012   CRP 0.6 (H) 10/21/2012   REPTSTATUS 06/29/2017 FINAL 06/27/2017   GRAMSTAIN  09/25/2012    MODERATE WBC PRESENT,BOTH PMN AND MONONUCLEAR NO ORGANISMS SEEN Performed at [facility]  09/25/2012    MODERATE WBC PRESENT,BOTH PMN AND MONONUCLEAR NO ORGANISMS SEEN Performed at [facility]  09/25/2012    MODERATE WBC PRESENT,BOTH PMN AND MONONUCLEAR NO ORGANISMS SEEN   CULT NO GROUP A STREP (S.PYOGENES) ISOLATED 06/27/2017   LABORGA ESCHERICHIA COLI 03/25/2013     Lab Results  Component Value Date   ALBUMIN 3.3 (L) 06/25/2018   ALBUMIN 3.7 09/03/2013   ALBUMIN 3.0 (L) 03/25/2013    Lab Results  Component Value Date   MG 1.7 07/16/2012   MG 2.4 01/09/2011   No results found for: VD25OH  No results found for: PREALBUMIN CBC EXTENDED Latest Ref Rng & Units 05/15/2019 06/25/2018 06/27/2017  WBC 4.0 - 10.5 K/uL 8.6 - -  RBC 3.87 - 5.11 MIL/uL 5.18(H) - -  HGB 12.0 - 15.0 g/dL 14.6 13.7 13.6  HCT 36.0 - 46.0 % 45.4 - 40.0  PLT 150 - 400 K/uL 249 - -  NEUTROABS 1.7 - 7.7 K/uL - - -  LYMPHSABS 0.7 - 4.0 K/uL - - -     Body mass index is 34.37 kg/m.  Orders:  Orders Placed This Encounter  Procedures  . XR Ankle Complete Left   Meds ordered this encounter  Medications  . DISCONTD: oxyCODONE-acetaminophen (PERCOCET) 7.5-325 MG tablet    Sig: Take 1 tablet by mouth 2 (two) times daily as needed.    Dispense:  28 tablet    Refill:  0  . oxyCODONE-acetaminophen (PERCOCET) 7.5-325 MG tablet    Sig: Take 1 tablet by mouth 2 (two) times daily as needed.    Dispense:  28 tablet    Refill:  0     Procedures: No procedures performed  Clinical Data: No additional findings.  ROS:  All other systems negative, except as noted in the HPI. Review of Systems  Constitutional: Negative for chills and fever.   Musculoskeletal: Positive for arthralgias and joint swelling.  Skin: Negative for wound.    Objective: Vital Signs: Ht 5\' 3"  (1.6 m)   Wt 194 lb (88 kg)   LMP 06/22/2018   BMI 34.37 kg/m   Specialty Comments:  No specialty comments available.  PMFS History: Patient Active Problem List   Diagnosis Date Noted  . Status post ankle fusion 05/15/2019  . Post-traumatic arthritis of ankle, left   . Traumatic arthritis of left ankle   . Rash and nonspecific skin eruption 10/06/2013  . URI, acute 10/06/2013  . Obesity, unspecified 10/06/2013  . Tobacco use disorder 10/05/2013  . Hoarseness, chronic 09/05/2013  . 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 Bipolar disorder (HCC)   . Polysubstance abuse (HCC) 09/13/2012  . Anxiety and depression 07/19/2012  . 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 Anxiety    Panic attacks  . Arthritis   . Arthritis of left subtalar joint   . Bipolar disorder (HCC) 01/21/13  . Chronic pain   . COPD (chronic obstructive pulmonary disease) (HCC)   . Depression   . Drug-seeking behavior   . Dyspnea    with much activity  . GERD (gastroesophageal reflux disease)   . Head injury with loss of consciousness (HCC)   . 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 Lupus (HCC)   . Lupus (HCC)   . MRSA (methicillin resistant Staphylococcus aureus)   . Tobacco abuse     Family History  Problem Relation Age of Onset  . Stroke Mother     Past Surgical History:  Procedure Laterality Date  . ANKLE ARTHROSCOPY Left 06/25/2018   Procedure: LEFT ANKLE ARTHROSCOPY;  Surgeon: 08/25/2018, MD;  Location: [facility] OR;  Service: Orthopedics;  Laterality: Left;  . ANKLE ARTHROSCOPY WITH FUSION Left 12/15/2019   Procedure: LEFT ARTHROSCOPIC SUBTALAR FUSION;  Surgeon: 12/17/2019, MD;  Location:  SURGERY CENTER;  Service: Orthopedics;  Laterality: Left;  . ANKLE FUSION Left 05/15/2019   Procedure: LEFT ANKLE  FUSION;  Surgeon: 05/17/2019, MD;  Location: [facility] OR;  Service: Orthopedics;  Laterality: Left;  . ANKLE SURGERY Left   . APPLICATION OF WOUND VAC Left 05/15/2019   Procedure: Application Of Wound Vac;  Surgeon: 05/17/2019, MD;  Location: [facility] OR;  Service: Orthopedics;  Laterality: Left;  . CHOLECYSTECTOMY    . HARDWARE REMOVAL  09/25/2012   Procedure: HARDWARE REMOVAL;  Surgeon: 13/05/2012, MD;  Location: [facility] OR;  Service: Orthopedics;  Laterality: Left;  Incision, drainage and debridement of left lateral ankle,  removal of plate and screws  . TUBAL LIGATION     Social History   Occupational History  . Not on file  Tobacco Use  . Smoking status: Current Every Day Smoker    Packs/day: 1.00    Years: 37.00    Pack years: 37.00    Types: Cigarettes  . Smokeless tobacco: Never Used  Substance and Sexual Activity  . Alcohol use: No  . Drug use: Yes    Types: Marijuana    Comment: last smoked 21mo ago  . Sexual activity: Yes    Birth control/protection: Post-menopausal    Comment: BTL

## 2020-03-08 ENCOUNTER — Other Ambulatory Visit: Payer: Self-pay | Admitting: Orthopedic Surgery

## 2020-03-08 ENCOUNTER — Telehealth: Payer: Self-pay | Admitting: Specialist

## 2020-03-08 ENCOUNTER — Telehealth: Payer: Self-pay | Admitting: Orthopedic Surgery

## 2020-03-08 MED ORDER — OXYCODONE-ACETAMINOPHEN 7.5-325 MG PO TABS: 1.0000 | ORAL_TABLET | Freq: Two times a day (BID) | ORAL | 0 refills | Status: DC | PRN

## 2020-03-08 NOTE — Telephone Encounter (Signed)
Rx sent 

## 2020-03-08 NOTE — Telephone Encounter (Signed)
Made in error

## 2020-03-08 NOTE — Telephone Encounter (Signed)
Patient called.   She is requesting a refill on her oxyCODONE.   Call back: [phone]

## 2020-03-08 NOTE — Telephone Encounter (Signed)
S/p subtalar fusion 12/15/19 and requesting refill on Oxycodone 7.5/325 last refill was 02/23/20 #28

## 2020-03-15 ENCOUNTER — Ambulatory Visit (INDEPENDENT_AMBULATORY_CARE_PROVIDER_SITE_OTHER): Payer: Medicaid Other | Admitting: Orthopedic Surgery

## 2020-03-15 ENCOUNTER — Other Ambulatory Visit: Payer: Self-pay

## 2020-03-15 DIAGNOSIS — Z981 Arthrodesis status: Secondary | ICD-10-CM

## 2020-03-15 DIAGNOSIS — M19172 Post-traumatic osteoarthritis, left ankle and foot: Secondary | ICD-10-CM

## 2020-03-15 MED ORDER — OXYCODONE-ACETAMINOPHEN 7.5-325 MG PO TABS: 1.0000 | ORAL_TABLET | Freq: Two times a day (BID) | ORAL | 0 refills | Status: DC | PRN

## 2020-03-16 ENCOUNTER — Encounter: Payer: Self-pay | Admitting: Orthopedic Surgery

## 2020-03-16 NOTE — Progress Notes (Addendum)
Office Visit Note   Patient: [name]           Date of Birth: 06/21/1971           MRN: [id number] Visit Date: 03/15/2020              Requested by: Practice, [place],  [place] [address] PCP: [facility], [facility]  Chief Complaint  Patient presents with  . Left Ankle - Routine Post Op      HPI: Patient is a 49 year old woman who presents approximately 3 months status post left foot posterior subtalar fusion.  She is also status post an ankle fusion.  Patient states she cannot put any weight down on her foot she is ambulating with a walker.  She is currently ambulating in flip-flops.  Assessment & Plan: Visit Diagnoses:  1. Post-traumatic arthritis of ankle, left   2. S/P ankle fusion     Plan: We will order CT scan to evaluate for possible fibrous union.  Prescription called in for Percocet.  Discussed that we may need to proceed with a cast depending on the CT scan findings.  Follow-Up Instructions: Return in about 1 week (around 03/22/2020).   Ortho Exam  Patient is alert, oriented, no adenopathy, well-dressed, normal affect, normal respiratory effort. Examination patient has a good dorsalis pedis pulse there is no skin color or temperature changes no signs or symptoms of complex regional pain syndrome.  Her foot is plantigrade.  The radiographs shows stable alignment of the ankle and subtalar fusion.  Patient has extreme amount of pain when trying to weight-bear.  No pain at rest.  Imaging: No results found. No images are attached to the encounter.  Labs: Lab Results  Component Value Date   HGBA1C 5.1 10/05/2013   HGBA1C 5.5 09/25/2012   HGBA1C 5.5 09/13/2012   ESRSEDRATE 18 01/14/2013   ESRSEDRATE 28 (H) 11/24/2012   ESRSEDRATE 18 10/21/2012   CRP 7.1 (H) 01/14/2013   CRP 1.6 (H) 11/24/2012   CRP 0.6 (H) 10/21/2012   REPTSTATUS 06/29/2017 FINAL 06/27/2017   GRAMSTAIN  09/25/2012    MODERATE WBC  PRESENT,BOTH PMN AND MONONUCLEAR NO ORGANISMS SEEN Performed at [facility]  09/25/2012    MODERATE WBC PRESENT,BOTH PMN AND MONONUCLEAR NO ORGANISMS SEEN Performed at [facility]  09/25/2012    MODERATE WBC PRESENT,BOTH PMN AND MONONUCLEAR NO ORGANISMS SEEN   CULT NO GROUP A STREP (S.PYOGENES) ISOLATED 06/27/2017   LABORGA ESCHERICHIA COLI 03/25/2013     Lab Results  Component Value Date   ALBUMIN 3.3 (L) 06/25/2018   ALBUMIN 3.7 09/03/2013   ALBUMIN 3.0 (L) 03/25/2013    Lab Results  Component Value Date   MG 1.7 07/16/2012   MG 2.4 01/09/2011   No results found for: VD25OH  No results found for: PREALBUMIN CBC EXTENDED Latest Ref Rng & Units 05/15/2019 06/25/2018 06/27/2017  WBC 4.0 - 10.5 K/uL 8.6 - -  RBC 3.87 - 5.11 MIL/uL 5.18(H) - -  HGB 12.0 - 15.0 g/dL 14.6 13.7 13.6  HCT 36.0 - 46.0 % 45.4 - 40.0  PLT 150 - 400 K/uL 249 - -  NEUTROABS 1.7 - 7.7 K/uL - - -  LYMPHSABS 0.7 - 4.0 K/uL - - -     There is no height or weight on file to calculate BMI.  Orders:  Orders Placed This Encounter  Procedures  . CT ANKLE LEFT  WO CONTRAST   Meds ordered this encounter  Medications  . oxyCODONE-acetaminophen (PERCOCET) 7.5-325 MG tablet    Sig: Take 1 tablet by mouth 2 (two) times daily as needed.    Dispense:  20 tablet    Refill:  0     Procedures: No procedures performed  Clinical Data: No additional findings.  ROS:  All other systems negative, except as noted in the HPI. Review of Systems  Objective: Vital Signs: LMP 06/22/2018   Specialty Comments:  No specialty comments available.  PMFS History: Patient Active Problem List   Diagnosis Date Noted  . Status post ankle fusion 05/15/2019  . Post-traumatic arthritis of ankle, left   . Traumatic arthritis of left ankle   . Rash and nonspecific skin eruption 10/06/2013  . URI, acute 10/06/2013  . Obesity, unspecified 10/06/2013  . Tobacco use disorder  10/05/2013  . Hoarseness, chronic 09/05/2013  . 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 Bipolar disorder (HCC)   . Polysubstance abuse (HCC) 09/13/2012  . Anxiety and depression 07/19/2012  . 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 Anxiety    Panic attacks  . Arthritis   . Arthritis of left subtalar joint   . Bipolar disorder (HCC) 01/21/13  . Chronic pain   . COPD (chronic obstructive pulmonary disease) (HCC)   . Depression   . Drug-seeking behavior   . Dyspnea    with much activity  . GERD (gastroesophageal reflux disease)   . Head injury with loss of consciousness (HCC)   . 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 Lupus (HCC)   . Lupus (HCC)   . MRSA (methicillin resistant Staphylococcus aureus)   . Tobacco abuse     Family History  Problem Relation Age of Onset  . Stroke Mother     Past Surgical History:  Procedure Laterality Date  . ANKLE ARTHROSCOPY Left 06/25/2018   Procedure: LEFT ANKLE ARTHROSCOPY;  Surgeon: Nadara Mustard, MD;  Location: [facility] OR;  Service: Orthopedics;  Laterality: Left;  . ANKLE ARTHROSCOPY WITH FUSION Left 12/15/2019   Procedure: LEFT ARTHROSCOPIC SUBTALAR FUSION;  Surgeon: Nadara Mustard, MD;  Location: [facility];  Service: Orthopedics;  Laterality: Left;  . ANKLE FUSION Left 05/15/2019   Procedure: LEFT ANKLE FUSION;  Surgeon: Nadara Mustard, MD;  Location: [facility] OR;  Service: Orthopedics;  Laterality: Left;  . ANKLE SURGERY Left   . APPLICATION OF WOUND VAC Left 05/15/2019   Procedure: Application Of Wound Vac;  Surgeon: Nadara Mustard, MD;  Location: [facility] OR;  Service: Orthopedics;  Laterality: Left;  . CHOLECYSTECTOMY    . HARDWARE REMOVAL  09/25/2012   Procedure: HARDWARE REMOVAL;  Surgeon: Kerrin Champagne, MD;  Location: [facility] OR;  Service: Orthopedics;  Laterality: Left;  Incision, drainage and debridement of left lateral ankle, removal of plate and screws  . TUBAL LIGATION     Social  History   Occupational History  . Not on file  Tobacco Use  . Smoking status: Current Every Day Smoker    Packs/day: 1.00    Years: 37.00    Pack years: 37.00    Types: Cigarettes  . Smokeless tobacco: Never Used  Substance and Sexual Activity  . Alcohol use: No  . Drug use: Yes    Types: Marijuana    Comment: last smoked 75mo ago  . Sexual activity: Yes    Birth control/protection: Post-menopausal    Comment: BTL

## 2020-03-17 ENCOUNTER — Ambulatory Visit: Payer: Medicaid Other | Admitting: Specialist

## 2020-03-25 ENCOUNTER — Telehealth: Payer: Self-pay | Admitting: Orthopedic Surgery

## 2020-03-25 MED ORDER — OXYCODONE-ACETAMINOPHEN 7.5-325 MG PO TABS: 1.0000 | ORAL_TABLET | Freq: Two times a day (BID) | ORAL | 0 refills | Status: DC | PRN

## 2020-03-25 NOTE — Telephone Encounter (Signed)
Pt is pending CT scan to eval for nonunion pt requesting refill on Oxycodone 7.5/325 last refill was 03/15/20 #20

## 2020-03-25 NOTE — Telephone Encounter (Signed)
Patient called. She would like a refill on her Oxycodone called in for pick up on Sunday. Her phone number is [phone]

## 2020-03-25 NOTE — Addendum Note (Signed)
Addended by: Barnie Del R on: 03/25/2020 11:14 AM   Modules accepted: Orders

## 2020-03-26 ENCOUNTER — Other Ambulatory Visit: Payer: Self-pay | Admitting: Orthopedic Surgery

## 2020-03-28 NOTE — Telephone Encounter (Signed)
Do you want to refill this medication?

## 2020-04-01 ENCOUNTER — Ambulatory Visit (HOSPITAL_COMMUNITY): Admission: RE | Admit: 2020-04-01 | Payer: Medicaid Other | Source: Ambulatory Visit

## 2020-04-04 ENCOUNTER — Telehealth: Payer: Self-pay | Admitting: Orthopedic Surgery

## 2020-04-04 ENCOUNTER — Other Ambulatory Visit: Payer: Self-pay | Admitting: Orthopedic Surgery

## 2020-04-04 MED ORDER — OXYCODONE-ACETAMINOPHEN 7.5-325 MG PO TABS: 1.0000 | ORAL_TABLET | Freq: Two times a day (BID) | ORAL | 0 refills | Status: DC | PRN

## 2020-04-04 NOTE — Telephone Encounter (Signed)
Rx sent 

## 2020-04-04 NOTE — Telephone Encounter (Signed)
Patient called requesting a refill of oxycodone. Please send to pharmacy on file. Patient phone number is [phone].

## 2020-04-05 ENCOUNTER — Telehealth: Payer: Self-pay | Admitting: Orthopedic Surgery

## 2020-04-05 NOTE — Telephone Encounter (Signed)
Patient called advised she could not get all of her Rx (Oxycodone) because medicaid need prior auth before they will fill the rest of the Rx. Patient said she go for her CT scan 04/07/2020. The number to contact patient is [phone]

## 2020-04-06 NOTE — Telephone Encounter (Signed)
I have tried to enter prior auth for the pt's pain medication to recert  for her oxycodone 7.5/325 and I am getting an error message in [place] tracks and do not know what to do. Can you please take a look?

## 2020-04-07 ENCOUNTER — Ambulatory Visit (HOSPITAL_COMMUNITY): Payer: Medicaid Other

## 2020-04-07 NOTE — Telephone Encounter (Signed)
I was able to get authorized. Will bring confirmation to you to send to scan place.

## 2020-04-07 NOTE — Telephone Encounter (Signed)
Called and lm on vm to advise pt that this has been done.

## 2020-04-08 ENCOUNTER — Telehealth: Payer: Self-pay | Admitting: Orthopedic Surgery

## 2020-04-08 NOTE — Telephone Encounter (Signed)
Called and lm on vm to advise pt [facility] called to advise pt cx appt for CT scan on 04/07/20 and this is the second time. Advised that she needs to call and reschedule to procedure and that we see in office to review results. I had just called her yesterday to advise pain medication had been faxed to pharm and that we are not able to continue to fill medication without eval of ankle. To call with questions.

## 2020-04-08 NOTE — Telephone Encounter (Signed)
Lupita Leash with [facility] called to make Dr. Lajoyce Corners aware the pt was a no show for her CT on 04/07/20.

## 2020-04-11 ENCOUNTER — Telehealth: Payer: Self-pay | Admitting: Orthopedic Surgery

## 2020-04-11 NOTE — Telephone Encounter (Signed)
Patient called for refill of oxycodone. I did update her about the authorization for medication. Please send to pharmacy on file. Patient phone number is [phone].

## 2020-04-12 NOTE — Telephone Encounter (Signed)
I called and lm on vm to advise that per Dr. Lajoyce Corners not able to refill pain medication until pt has CT scan to evaluate the cause of her pain. To call with questions.

## 2020-04-13 ENCOUNTER — Telehealth: Payer: Self-pay | Admitting: Orthopedic Surgery

## 2020-04-13 NOTE — Telephone Encounter (Signed)
Patient called.   Needs a refill on her oxycodone.   Call back: [phone]

## 2020-04-14 ENCOUNTER — Telehealth: Payer: Self-pay | Admitting: Orthopedic Surgery

## 2020-04-14 NOTE — Telephone Encounter (Signed)
Patient called requesting a refill of oxycodone medication. Please send to pharmacy on file. Patient phone number is [phone].

## 2020-04-14 NOTE — Telephone Encounter (Signed)
I called and lm on vm to advise that I have left several messages to advise the pt that we need for her to resch her CT scan. Dr. Lajoyce Corners needs to evaluate the fusion for a nonunion and if this is the source of her continued pain. Per Dr. Lajoyce Corners can not continue to give narcotic pain medication much evaluate what's causing the pain and then make a plan for treatment. Can call with questions.

## 2020-04-14 NOTE — Telephone Encounter (Signed)
I called and l left message for the 4th time saying the same thing. We are not able to refill this medication at the time. The pt has not shown for 2 CT scans that were scheduled for evaluation of her ankle and this needs to be obtained first before any other medication can be given. Dr. Lajoyce Corners needs to eval why she is having the pain and then can decide best treatment options. To call with any questions.

## 2020-04-19 ENCOUNTER — Ambulatory Visit: Payer: Medicaid Other | Admitting: Orthopedic Surgery

## 2020-04-28 ENCOUNTER — Ambulatory Visit (INDEPENDENT_AMBULATORY_CARE_PROVIDER_SITE_OTHER): Payer: Medicaid Other | Admitting: Physician Assistant

## 2020-04-28 ENCOUNTER — Encounter: Payer: Self-pay | Admitting: Orthopedic Surgery

## 2020-04-28 VITALS — Ht 63.0 in | Wt 194.0 lb

## 2020-04-28 DIAGNOSIS — Z981 Arthrodesis status: Secondary | ICD-10-CM

## 2020-04-28 MED ORDER — OXYCODONE-ACETAMINOPHEN 7.5-325 MG PO TABS: 1.0000 | ORAL_TABLET | Freq: Two times a day (BID) | ORAL | 0 refills | Status: AC | PRN

## 2020-04-28 NOTE — Progress Notes (Signed)
Office Visit Note   Patient: [name]           Date of Birth: December 11, 1970           MRN: [id number] Visit Date: 04/28/2020              Requested by: [facility], [facility] [address] [place],  [place] [address] PCP: [facility], [facility]  Chief Complaint  Patient presents with  . Left Ankle - Pain    Left subtalar fusion DOS 12/15/2019      HPI: Patient today follows up for her left ankle.  She is 4 months status post left subtalar fusion.  She has struggled with pain to the point where she has had to use a walker in a cam boot.  At her last visit we ordered a CT scan of her left ankle to evaluate the fusion.  She has had some difficulties with moving and did not follow-up with this appointment.  She would now like to go forward with this.  She is also requesting a refill of her pain medication which had been denied because she had not followed up in the office  Assessment & Plan: Visit Diagnoses:  1. S/P ankle fusion     Plan:  she will follow up once it is complete  prescription was provided for the CT scan.  I did give her 1 prescription for her pain medication she understands that we cannot do chronic pain management and that this will have to last until she gets her CT scan  Follow-Up Instructions: No follow-ups on file.   Ortho Exam  Patient is alert, oriented, no adenopathy, well-dressed, normal affect, normal respiratory effort. Left ankle she has pain when trying to come to even a neutral position.  She has stiffness through the subtalar joint though manipulation of this joint is quite painful.  Well-healed surgical incision no erythema or cellulitis mild to moderate soft tissue swelling  Imaging: No results found. No images are attached to the encounter.  Labs: Lab Results  Component Value Date   HGBA1C 5.1 10/05/2013   HGBA1C 5.5 09/25/2012   HGBA1C 5.5 09/13/2012   ESRSEDRATE 18 01/14/2013   ESRSEDRATE 28 (H) 11/24/2012    ESRSEDRATE 18 10/21/2012   CRP 7.1 (H) 01/14/2013   CRP 1.6 (H) 11/24/2012   CRP 0.6 (H) 10/21/2012   REPTSTATUS 06/29/2017 FINAL 06/27/2017   GRAMSTAIN  09/25/2012    MODERATE WBC PRESENT,BOTH PMN AND MONONUCLEAR NO ORGANISMS SEEN Performed at [facility]   GRAMSTAIN  09/25/2012    MODERATE WBC PRESENT,BOTH PMN AND MONONUCLEAR NO ORGANISMS SEEN Performed at [facility]   GRAMSTAIN  09/25/2012    MODERATE WBC PRESENT,BOTH PMN AND MONONUCLEAR NO ORGANISMS SEEN   CULT NO GROUP A STREP (S.PYOGENES) ISOLATED 06/27/2017   LABORGA ESCHERICHIA COLI 03/25/2013     Lab Results  Component Value Date   ALBUMIN 3.3 (L) 06/25/2018   ALBUMIN 3.7 09/03/2013   ALBUMIN 3.0 (L) 03/25/2013    Lab Results  Component Value Date   MG 1.7 07/16/2012   MG 2.4 01/09/2011   No results found for: VD25OH  No results found for: PREALBUMIN CBC EXTENDED Latest Ref Rng & Units 05/15/2019 06/25/2018 06/27/2017  WBC 4.0 - 10.5 K/uL 8.6 - -  RBC 3.87 - 5.11 MIL/uL 5.18(H) - -  HGB 12.0 - 15.0 g/dL 57.0 17.7 93.9  HCT 36 - 46 % 45.4 - 40.0  PLT 150 -  400 K/uL 249 - -  NEUTROABS 1.7 - 7.7 K/uL - - -  LYMPHSABS 0.7 - 4.0 K/uL - - -     Body mass index is 34.37 kg/m.  Orders:  Orders Placed This Encounter  Procedures  . CT ANKLE LEFT WO CONTRAST   Meds ordered this encounter  Medications  . oxyCODONE-acetaminophen (PERCOCET) 7.5-325 MG tablet    Sig: Take 1 tablet by mouth 2 (two) times daily as needed.    Dispense:  20 tablet    Refill:  0     Procedures: No procedures performed  Clinical Data: No additional findings.  ROS:  All other systems negative, except as noted in the HPI. Review of Systems  Objective: Vital Signs: Ht 5\' 3"  (1.6 m)   Wt 194 lb (88 kg)   LMP 06/22/2018   BMI 34.37 kg/m   Specialty Comments:  No specialty comments available.  PMFS History: Patient Active Problem List   Diagnosis Date Noted  . Status post ankle fusion 05/15/2019  .  Post-traumatic arthritis of ankle, left   . Traumatic arthritis of left ankle   . Rash and nonspecific skin eruption 10/06/2013  . URI, acute 10/06/2013  . Obesity, unspecified 10/06/2013  . Tobacco use disorder 10/05/2013  . Hoarseness, chronic 09/05/2013  . Lupus arthritis ([place]) 09/03/2013  . Bipolar disorder ([place])   . Polysubstance abuse ([place]) 09/13/2012  . Anxiety and depression 07/19/2012  . 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 Anxiety    Panic attacks  . Arthritis   . Arthritis of left subtalar joint   . Bipolar disorder ([place]) 01/21/13  . Chronic pain   . COPD (chronic obstructive pulmonary disease) ([place])   . Depression   . Drug-seeking behavior   . Dyspnea    with much activity  . GERD (gastroesophageal reflux disease)   . Head injury with loss of consciousness ([place])   . Leukocytoclastic vasculitis ([place])   . Lupus ([place])   . Lupus ([place])   . MRSA (methicillin resistant Staphylococcus aureus)   . Tobacco abuse     Family History  Problem Relation Age of Onset  . Stroke Mother     Past Surgical History:  Procedure Laterality Date  . ANKLE ARTHROSCOPY Left 06/25/2018   Procedure: LEFT ANKLE ARTHROSCOPY;  Surgeon: Newt Minion, MD;  Location: [facility];  Service: Orthopedics;  Laterality: Left;  . ANKLE ARTHROSCOPY WITH FUSION Left 12/15/2019   Procedure: LEFT ARTHROSCOPIC SUBTALAR FUSION;  Surgeon: Newt Minion, MD;  Location: [facility];  Service: Orthopedics;  Laterality: Left;  . ANKLE FUSION Left 05/15/2019   Procedure: LEFT ANKLE FUSION;  Surgeon: Newt Minion, MD;  Location: [facility];  Service: Orthopedics;  Laterality: Left;  . ANKLE SURGERY Left   . APPLICATION OF WOUND VAC Left 05/15/2019   Procedure: Application Of Wound Vac;  Surgeon: Newt Minion, MD;  Location: [facility];  Service: Orthopedics;  Laterality: Left;  . CHOLECYSTECTOMY    . HARDWARE REMOVAL  09/25/2012   Procedure: HARDWARE  REMOVAL;  Surgeon: Jessy Oto, MD;  Location: [facility];  Service: Orthopedics;  Laterality: Left;  Incision, drainage and debridement of left lateral ankle, removal of plate and screws  . TUBAL LIGATION     Social History   Occupational History  . Not on file  Tobacco Use  . Smoking status: Current Every Day Smoker    Packs/day: 1.00  Years: 37.00    Pack years: 37.00    Types: Cigarettes  . Smokeless tobacco: Never Used  Vaping Use  . Vaping Use: Never used  Substance and Sexual Activity  . Alcohol use: No  . Drug use: Yes    Types: Marijuana    Comment: last smoked 63mo ago  . Sexual activity: Yes    Birth control/protection: Post-menopausal    Comment: BTL

## 2020-05-18 ENCOUNTER — Inpatient Hospital Stay: Admission: RE | Admit: 2020-05-18 | Payer: Medicaid Other | Source: Ambulatory Visit

## 2020-05-24 ENCOUNTER — Ambulatory Visit: Payer: Medicaid Other | Admitting: Orthopedic Surgery

## 2020-10-02 IMAGING — MR MR ANKLE*R* W/O CM
4 of 6 series · 17 of 40 positions shown · non-contrast
Comparison: Radiographs dated 10/27/2019

CLINICAL DATA: Nonhealing soft tissue wound at the lateral
malleolus.

EXAM:
MRI OF THE RIGHT ANKLE WITHOUT CONTRAST
TECHNIQUE: Multiplanar, multisequence MR imaging of the ankle was performed. No
intravenous contrast was administered.

[Series 4: T2 fat-sat · axial · right · 3.0mm · 0.25mm/px · z∈[-91,+64]mm · 8 of 40 slices shown]
[im 1/40]
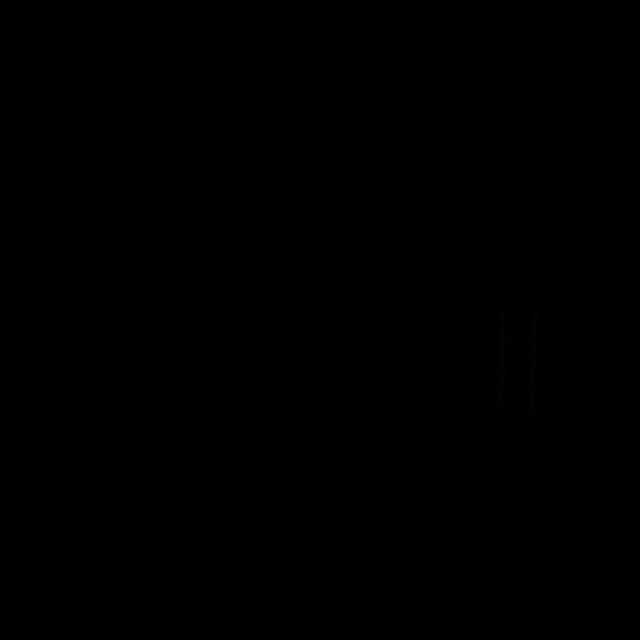
[im 6/40]
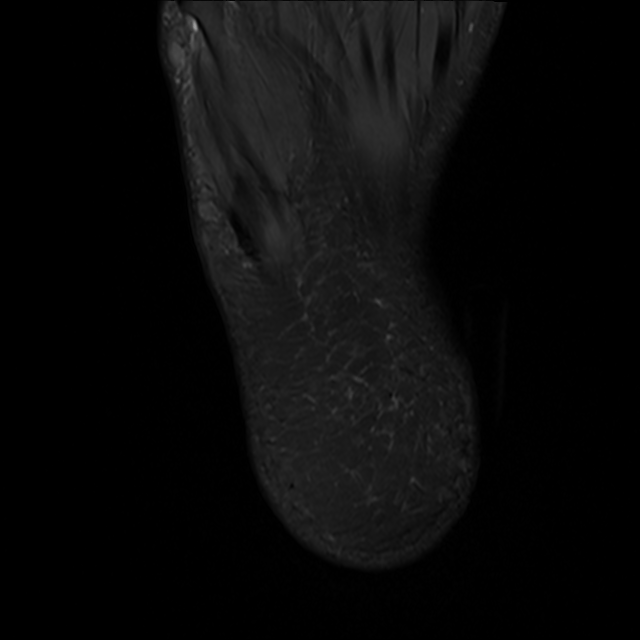
[im 12/40]
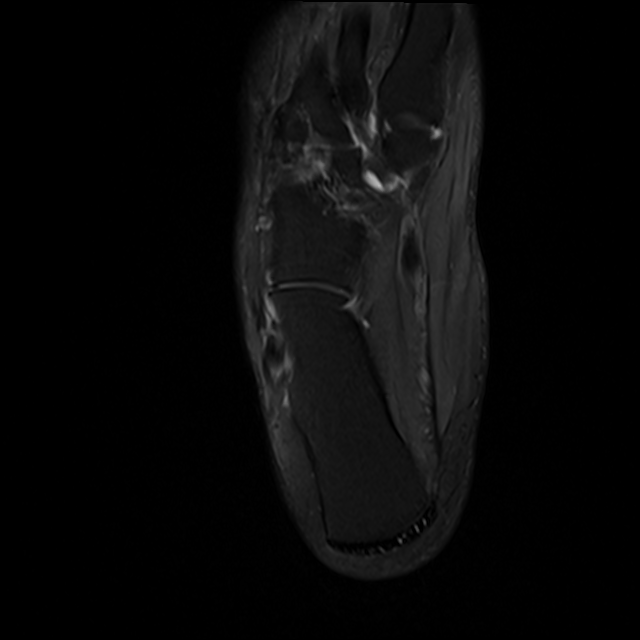
[im 17/40]
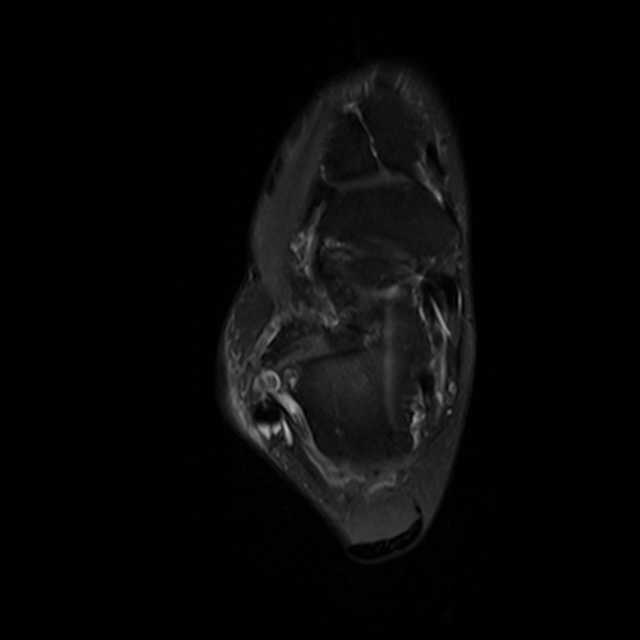
[im 23/40]
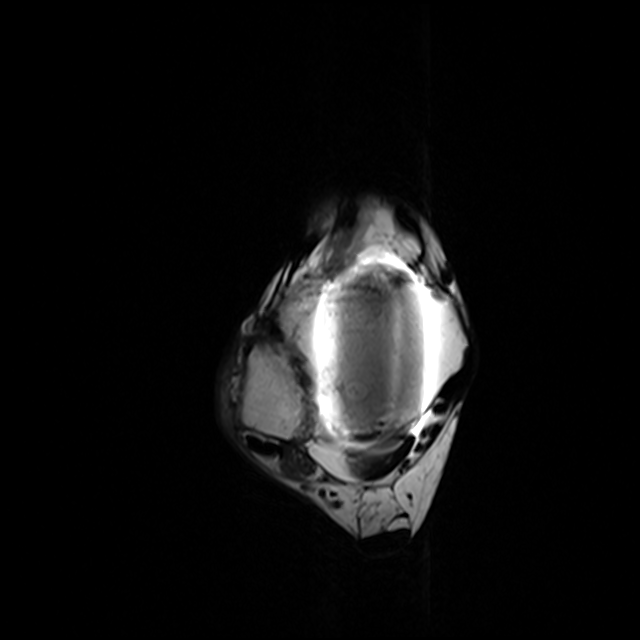
[im 28/40]
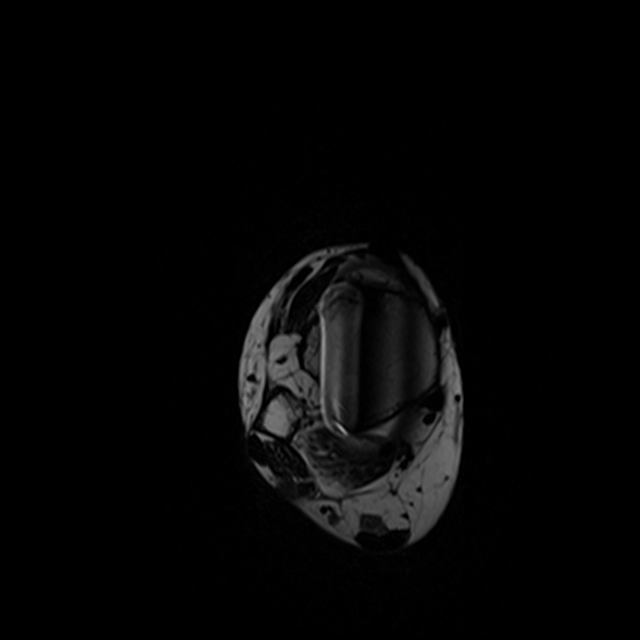
[im 34/40]
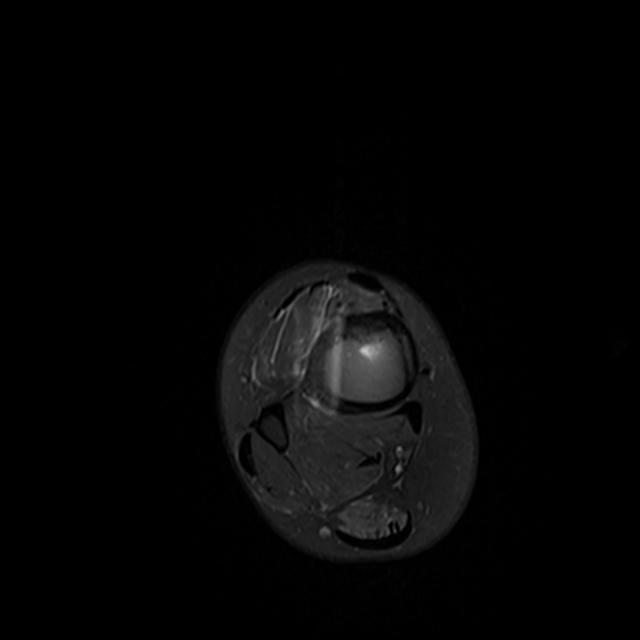
[im 40/40]
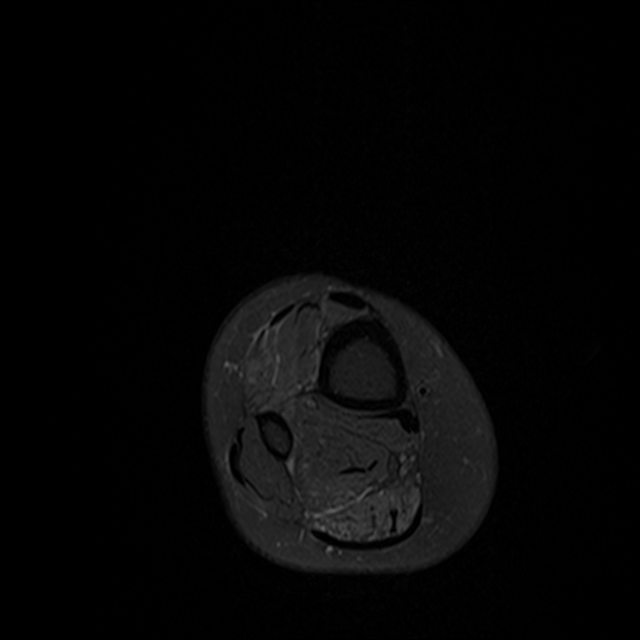

[Series 5: T1 · axial · right · 3.0mm · 0.31mm/px · z∈[-71,+41]mm · 3 of 40 slices shown (1 of 2)]
[im 6/40]
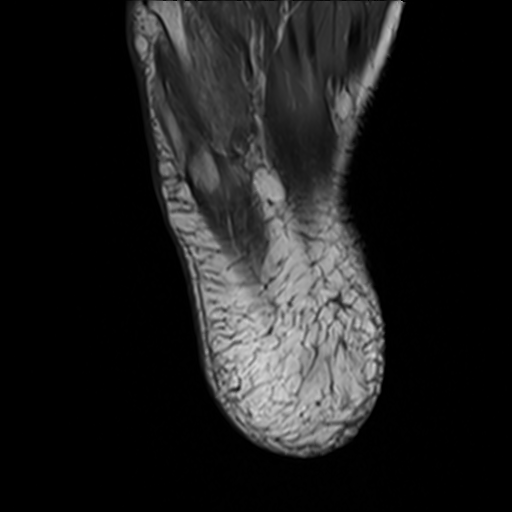
[im 23/40]
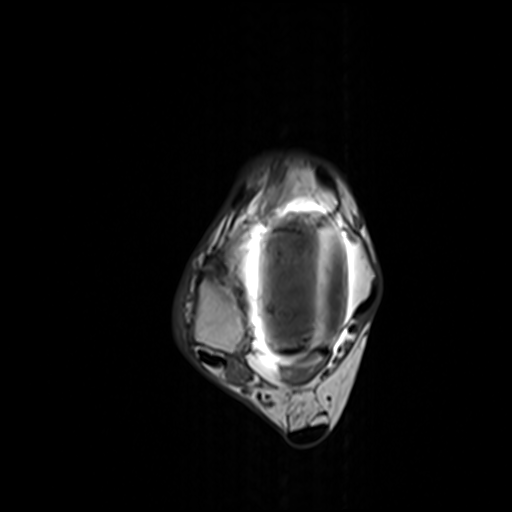
[im 34/40]
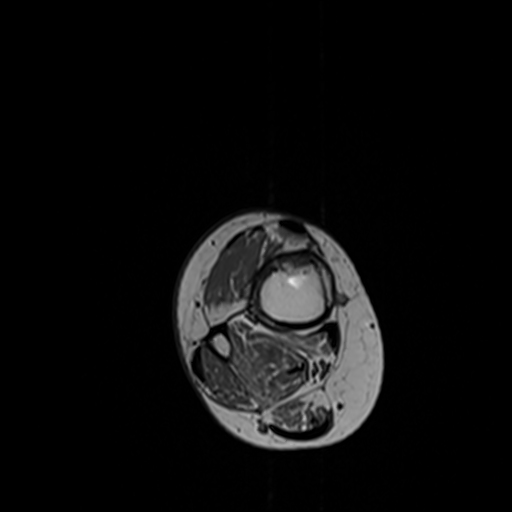

[Series 6: T1 · coronal · right · 3.0mm · 0.31mm/px · 3 of 36 slices shown (2 of 2)]
[im 6/36]
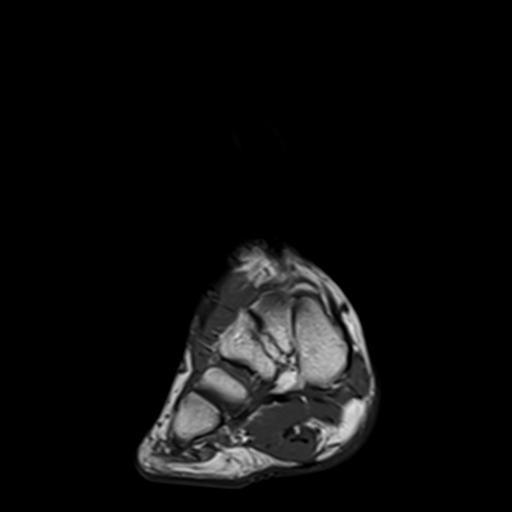
[im 18/36]
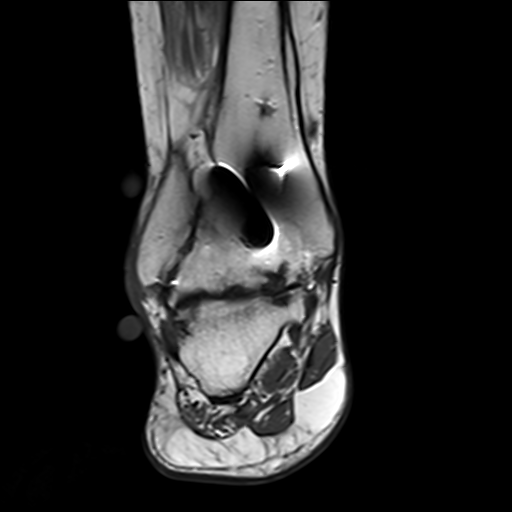
[im 30/36]
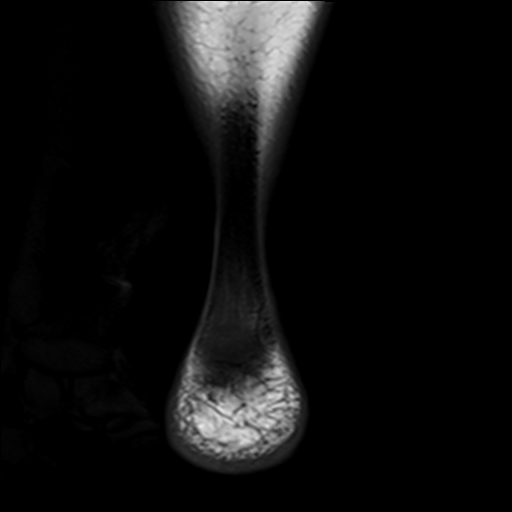

[Series 7: STIR · coronal · right · 3.0mm · 0.62mm/px · 3 of 36 slices shown]
[im 6/36]
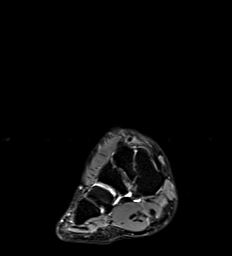
[im 18/36]
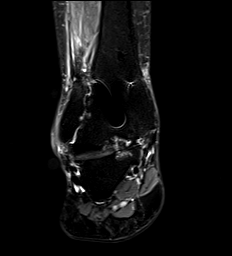
[im 30/36]
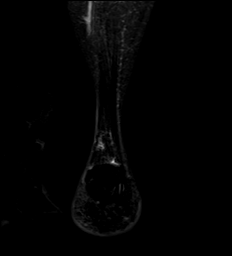

[17 of 40 positions shown; findings below may reference images not displayed]

FINDINGS: Bones: There is no osteomyelitis or other acute bone abnormality of
the right ankle. There is solid fusion of the distal tibia with the
talus. There is no apparent bone fusion between the distal fibula
and tibia. There are moderate arthritic changes of the subtalar
joint.

TENDONS

Peroneal: There is mild tenosynovitis of the peroneal tendons the
level of the lateral malleolus.

Posteromedial: Normal.

Anterior: Normal.

Achilles: Normal.

Plantar Fascia: Normal.

LIGAMENTS

Lateral: The lateral ligaments are not identified may be chronically
torn.

Medial: Absent due to fusion of the tibia with the talus.

CARTILAGE

Ankle Joint: Absent due to fusion of the tibia and talus.

Subtalar Joints/Sinus Tarsi: Osteoarthritic changes of the subtalar
joint.

Other: There is slight skin thickening and slight subcutaneous edema
of the soft tissues overlying lateral malleolus consistent with the
known superficial soft tissue ulceration. No underlying abscess or
osteomyelitis.
IMPRESSION: 1. No evidence of osteomyelitis or soft tissue abscess underlying
the superficial soft tissue ulceration at the lateral malleolus.
2. Focal soft tissue edema at the site of the abscess consistent
with cellulitis.
3. Subtalar joint arthritis.
